# Patient Record
Sex: Female | Born: 1935 | Race: White | Hispanic: No | Smoking: Never smoker
Health system: Southern US, Community
[De-identification: ages and names within clinical notes are randomized; demographics above are authoritative.]

## PROBLEM LIST (undated history)

## (undated) DIAGNOSIS — I10 Essential (primary) hypertension: Secondary | ICD-10-CM

## (undated) DIAGNOSIS — F419 Anxiety disorder, unspecified: Secondary | ICD-10-CM

## (undated) DIAGNOSIS — G629 Polyneuropathy, unspecified: Secondary | ICD-10-CM

## (undated) DIAGNOSIS — F329 Major depressive disorder, single episode, unspecified: Secondary | ICD-10-CM

## (undated) DIAGNOSIS — C569 Malignant neoplasm of unspecified ovary: Secondary | ICD-10-CM

## (undated) DIAGNOSIS — R269 Unspecified abnormalities of gait and mobility: Secondary | ICD-10-CM

## (undated) DIAGNOSIS — G5600 Carpal tunnel syndrome, unspecified upper limb: Secondary | ICD-10-CM

## (undated) DIAGNOSIS — S82899A Other fracture of unspecified lower leg, initial encounter for closed fracture: Secondary | ICD-10-CM

## (undated) DIAGNOSIS — F0390 Unspecified dementia without behavioral disturbance: Secondary | ICD-10-CM

## (undated) DIAGNOSIS — R413 Other amnesia: Principal | ICD-10-CM

## (undated) DIAGNOSIS — H9190 Unspecified hearing loss, unspecified ear: Secondary | ICD-10-CM

## (undated) DIAGNOSIS — M19079 Primary osteoarthritis, unspecified ankle and foot: Secondary | ICD-10-CM

## (undated) DIAGNOSIS — F32A Depression, unspecified: Secondary | ICD-10-CM

## (undated) DIAGNOSIS — H3530 Unspecified macular degeneration: Secondary | ICD-10-CM

## (undated) DIAGNOSIS — K219 Gastro-esophageal reflux disease without esophagitis: Secondary | ICD-10-CM

## (undated) HISTORY — DX: Polyneuropathy, unspecified: G62.9

## (undated) HISTORY — DX: Other fracture of unspecified lower leg, initial encounter for closed fracture: S82.899A

## (undated) HISTORY — DX: Unspecified hearing loss, unspecified ear: H91.90

## (undated) HISTORY — DX: Unspecified macular degeneration: H35.30

## (undated) HISTORY — DX: Depression, unspecified: F32.A

## (undated) HISTORY — DX: Essential (primary) hypertension: I10

## (undated) HISTORY — DX: Other amnesia: R41.3

## (undated) HISTORY — DX: Major depressive disorder, single episode, unspecified: F32.9

## (undated) HISTORY — DX: Unspecified abnormalities of gait and mobility: R26.9

## (undated) HISTORY — DX: Carpal tunnel syndrome, unspecified upper limb: G56.00

## (undated) HISTORY — DX: Anxiety disorder, unspecified: F41.9

## (undated) HISTORY — DX: Malignant neoplasm of unspecified ovary: C56.9

## (undated) HISTORY — DX: Primary osteoarthritis, unspecified ankle and foot: M19.079

## (undated) HISTORY — DX: Gastro-esophageal reflux disease without esophagitis: K21.9

---

## 2000-07-15 ENCOUNTER — Encounter: Payer: Self-pay | Admitting: Gastroenterology

## 2000-07-15 ENCOUNTER — Ambulatory Visit (HOSPITAL_COMMUNITY): Admission: RE | Admit: 2000-07-15 | Discharge: 2000-07-15 | Payer: Self-pay | Admitting: Gastroenterology

## 2002-04-14 ENCOUNTER — Encounter: Payer: Self-pay | Admitting: Internal Medicine

## 2002-04-14 ENCOUNTER — Encounter: Admission: RE | Admit: 2002-04-14 | Discharge: 2002-04-14 | Payer: Self-pay | Admitting: Internal Medicine

## 2003-11-15 ENCOUNTER — Encounter: Admission: RE | Admit: 2003-11-15 | Discharge: 2003-11-15 | Payer: Self-pay | Admitting: Orthopedic Surgery

## 2003-11-16 ENCOUNTER — Ambulatory Visit (HOSPITAL_BASED_OUTPATIENT_CLINIC_OR_DEPARTMENT_OTHER): Admission: RE | Admit: 2003-11-16 | Discharge: 2003-11-16 | Payer: Self-pay | Admitting: Orthopedic Surgery

## 2003-11-16 ENCOUNTER — Ambulatory Visit (HOSPITAL_COMMUNITY): Admission: RE | Admit: 2003-11-16 | Discharge: 2003-11-16 | Payer: Self-pay | Admitting: Orthopedic Surgery

## 2005-02-25 ENCOUNTER — Ambulatory Visit (HOSPITAL_COMMUNITY): Admission: RE | Admit: 2005-02-25 | Discharge: 2005-02-25 | Payer: Self-pay | Admitting: Gastroenterology

## 2005-12-01 HISTORY — PX: OTHER SURGICAL HISTORY: SHX169

## 2006-03-09 ENCOUNTER — Encounter: Admission: RE | Admit: 2006-03-09 | Discharge: 2006-03-09 | Payer: Self-pay | Admitting: Internal Medicine

## 2006-07-08 ENCOUNTER — Encounter: Admission: RE | Admit: 2006-07-08 | Discharge: 2006-07-08 | Payer: Self-pay | Admitting: Gastroenterology

## 2006-07-19 ENCOUNTER — Encounter: Admission: RE | Admit: 2006-07-19 | Discharge: 2006-07-19 | Payer: Self-pay | Admitting: Internal Medicine

## 2006-07-21 ENCOUNTER — Ambulatory Visit: Admission: RE | Admit: 2006-07-21 | Discharge: 2006-07-21 | Payer: Self-pay | Admitting: Gynecology

## 2006-08-05 ENCOUNTER — Ambulatory Visit: Payer: Self-pay | Admitting: Oncology

## 2006-08-25 ENCOUNTER — Ambulatory Visit: Admission: RE | Admit: 2006-08-25 | Discharge: 2006-08-25 | Payer: Self-pay | Admitting: Gynecology

## 2006-09-15 ENCOUNTER — Ambulatory Visit: Payer: Self-pay | Admitting: Oncology

## 2006-09-24 LAB — CBC WITH DIFFERENTIAL/PLATELET
BASO%: 0.1 % (ref 0.0–2.0)
EOS%: 0.5 % (ref 0.0–7.0)
HCT: 35.3 % (ref 34.8–46.6)
MCH: 30.3 pg (ref 26.0–34.0)
MCHC: 34.2 g/dL (ref 32.0–36.0)
MONO#: 0.6 10*3/uL (ref 0.1–0.9)
NEUT%: 79.9 % — ABNORMAL HIGH (ref 39.6–76.8)
RBC: 3.97 10*6/uL (ref 3.70–5.32)
WBC: 9 10*3/uL (ref 3.9–10.0)
lymph#: 1.2 10*3/uL (ref 0.9–3.3)

## 2006-09-24 LAB — COMPREHENSIVE METABOLIC PANEL
ALT: 17 U/L (ref 0–40)
AST: 12 U/L (ref 0–37)
Calcium: 8.7 mg/dL (ref 8.4–10.5)
Chloride: 99 mEq/L (ref 96–112)
Creatinine, Ser: 0.62 mg/dL (ref 0.40–1.20)
Sodium: 136 mEq/L (ref 135–145)
Total Bilirubin: 0.3 mg/dL (ref 0.3–1.2)
Total Protein: 5.9 g/dL — ABNORMAL LOW (ref 6.0–8.3)

## 2006-09-29 LAB — CBC WITH DIFFERENTIAL/PLATELET
BASO%: 0.2 % (ref 0.0–2.0)
Basophils Absolute: 0 10*3/uL (ref 0.0–0.1)
EOS%: 0 % (ref 0.0–7.0)
HGB: 12.5 g/dL (ref 11.6–15.9)
MCH: 29.5 pg (ref 26.0–34.0)
NEUT#: 6.6 10*3/uL — ABNORMAL HIGH (ref 1.5–6.5)
RDW: 13.1 % (ref 11.3–14.5)
WBC: 7 10*3/uL (ref 3.9–10.0)
lymph#: 0.4 10*3/uL — ABNORMAL LOW (ref 0.9–3.3)

## 2006-10-13 LAB — CA 125: CA 125: 6.4 U/mL (ref 0.0–30.2)

## 2006-10-13 LAB — COMPREHENSIVE METABOLIC PANEL
ALT: 19 U/L (ref 0–35)
AST: 12 U/L (ref 0–37)
CO2: 27 mEq/L (ref 19–32)
Creatinine, Ser: 0.59 mg/dL (ref 0.40–1.20)
Sodium: 132 mEq/L — ABNORMAL LOW (ref 135–145)
Total Bilirubin: 0.3 mg/dL (ref 0.3–1.2)
Total Protein: 6 g/dL (ref 6.0–8.3)

## 2006-10-13 LAB — CBC WITH DIFFERENTIAL/PLATELET
BASO%: 0.1 % (ref 0.0–2.0)
LYMPH%: 16.2 % (ref 14.0–48.0)
MCHC: 34.2 g/dL (ref 32.0–36.0)
MCV: 90.6 fL (ref 81.0–101.0)
MONO%: 6.5 % (ref 0.0–13.0)
Platelets: 288 10*3/uL (ref 145–400)
RBC: 3.63 10*6/uL — ABNORMAL LOW (ref 3.70–5.32)

## 2006-10-20 ENCOUNTER — Ambulatory Visit: Admission: RE | Admit: 2006-10-20 | Discharge: 2006-10-20 | Payer: Self-pay | Admitting: Gynecology

## 2006-10-20 LAB — CBC WITH DIFFERENTIAL/PLATELET
BASO%: 0.3 % (ref 0.0–2.0)
EOS%: 0 % (ref 0.0–7.0)
HCT: 34.3 % — ABNORMAL LOW (ref 34.8–46.6)
LYMPH%: 9.7 % — ABNORMAL LOW (ref 14.0–48.0)
MCH: 30.6 pg (ref 26.0–34.0)
MCHC: 34.2 g/dL (ref 32.0–36.0)
MCV: 89.5 fL (ref 81.0–101.0)
MONO#: 0 10*3/uL — ABNORMAL LOW (ref 0.1–0.9)
MONO%: 0.8 % (ref 0.0–13.0)
NEUT%: 89.3 % — ABNORMAL HIGH (ref 39.6–76.8)
Platelets: 398 10*3/uL (ref 145–400)
RBC: 3.83 10*6/uL (ref 3.70–5.32)
WBC: 3.7 10*3/uL — ABNORMAL LOW (ref 3.9–10.0)

## 2006-11-05 ENCOUNTER — Ambulatory Visit: Payer: Self-pay | Admitting: Oncology

## 2006-11-09 LAB — CBC WITH DIFFERENTIAL/PLATELET
EOS%: 0.2 % (ref 0.0–7.0)
Eosinophils Absolute: 0 10*3/uL (ref 0.0–0.5)
HCT: 32.3 % — ABNORMAL LOW (ref 34.8–46.6)
HGB: 11.2 g/dL — ABNORMAL LOW (ref 11.6–15.9)
MCH: 32.1 pg (ref 26.0–34.0)
MCHC: 34.7 g/dL (ref 32.0–36.0)
MONO%: 17.6 % — ABNORMAL HIGH (ref 0.0–13.0)
Platelets: 491 10*3/uL — ABNORMAL HIGH (ref 145–400)
RDW: 17.8 % — ABNORMAL HIGH (ref 11.3–14.5)
WBC: 4.3 10*3/uL (ref 3.9–10.0)

## 2006-11-09 LAB — COMPREHENSIVE METABOLIC PANEL
AST: 11 U/L (ref 0–37)
Albumin: 4 g/dL (ref 3.5–5.2)
Alkaline Phosphatase: 56 U/L (ref 39–117)
Glucose, Bld: 101 mg/dL — ABNORMAL HIGH (ref 70–99)
Potassium: 4 mEq/L (ref 3.5–5.3)
Sodium: 135 mEq/L (ref 135–145)
Total Protein: 6 g/dL (ref 6.0–8.3)

## 2006-12-02 LAB — COMPREHENSIVE METABOLIC PANEL
ALT: 12 U/L (ref 0–35)
AST: 13 U/L (ref 0–37)
Alkaline Phosphatase: 64 U/L (ref 39–117)
Creatinine, Ser: 0.5 mg/dL (ref 0.40–1.20)
Sodium: 135 mEq/L (ref 135–145)
Total Bilirubin: 0.6 mg/dL (ref 0.3–1.2)
Total Protein: 6.3 g/dL (ref 6.0–8.3)

## 2006-12-02 LAB — CBC WITH DIFFERENTIAL/PLATELET
BASO%: 0.3 % (ref 0.0–2.0)
EOS%: 0.3 % (ref 0.0–7.0)
HGB: 11.5 g/dL — ABNORMAL LOW (ref 11.6–15.9)
LYMPH%: 16.8 % (ref 14.0–48.0)
MCV: 95.7 fL (ref 81.0–101.0)
MONO#: 0.5 10*3/uL (ref 0.1–0.9)
MONO%: 12.7 % (ref 0.0–13.0)
NEUT#: 2.9 10*3/uL (ref 1.5–6.5)
NEUT%: 69.9 % (ref 39.6–76.8)
RDW: 17 % — ABNORMAL HIGH (ref 11.3–14.5)

## 2006-12-02 LAB — CA 125: CA 125: 7.9 U/mL (ref 0.0–30.2)

## 2006-12-05 ENCOUNTER — Emergency Department (HOSPITAL_COMMUNITY): Admission: EM | Admit: 2006-12-05 | Discharge: 2006-12-05 | Payer: Self-pay | Admitting: Emergency Medicine

## 2006-12-08 ENCOUNTER — Ambulatory Visit: Admission: EM | Admit: 2006-12-08 | Discharge: 2006-12-08 | Payer: Self-pay | Admitting: Oncology

## 2006-12-08 ENCOUNTER — Encounter: Payer: Self-pay | Admitting: Vascular Surgery

## 2006-12-18 ENCOUNTER — Ambulatory Visit: Payer: Self-pay | Admitting: Oncology

## 2006-12-23 LAB — COMPREHENSIVE METABOLIC PANEL
AST: 8 U/L (ref 0–37)
BUN: 13 mg/dL (ref 6–23)
Calcium: 9 mg/dL (ref 8.4–10.5)
Chloride: 98 mEq/L (ref 96–112)
Creatinine, Ser: 0.62 mg/dL (ref 0.40–1.20)
Total Bilirubin: 0.5 mg/dL (ref 0.3–1.2)

## 2006-12-23 LAB — CBC WITH DIFFERENTIAL/PLATELET
Basophils Absolute: 0 10*3/uL (ref 0.0–0.1)
EOS%: 0.5 % (ref 0.0–7.0)
HCT: 34.3 % — ABNORMAL LOW (ref 34.8–46.6)
HGB: 11.8 g/dL (ref 11.6–15.9)
LYMPH%: 24.3 % (ref 14.0–48.0)
MCH: 33.3 pg (ref 26.0–34.0)
MCHC: 34.4 g/dL (ref 32.0–36.0)
MCV: 96.7 fL (ref 81.0–101.0)
NEUT%: 62.7 % (ref 39.6–76.8)
Platelets: 480 10*3/uL — ABNORMAL HIGH (ref 145–400)
lymph#: 1.1 10*3/uL (ref 0.9–3.3)

## 2006-12-23 LAB — CA 125: CA 125: 8.4 U/mL (ref 0.0–30.2)

## 2007-01-05 ENCOUNTER — Ambulatory Visit: Payer: Self-pay | Admitting: Psychiatry

## 2007-01-15 LAB — CBC WITH DIFFERENTIAL/PLATELET
Basophils Absolute: 0.1 10*3/uL (ref 0.0–0.1)
Eosinophils Absolute: 0 10*3/uL (ref 0.0–0.5)
HGB: 12.2 g/dL (ref 11.6–15.9)
MCV: 94.7 fL (ref 81.0–101.0)
MONO#: 0.7 10*3/uL (ref 0.1–0.9)
NEUT#: 1.7 10*3/uL (ref 1.5–6.5)
RBC: 3.65 10*6/uL — ABNORMAL LOW (ref 3.70–5.32)
RDW: 11.6 % (ref 11.3–14.5)
WBC: 4.1 10*3/uL (ref 3.9–10.0)
lymph#: 1.6 10*3/uL (ref 0.9–3.3)

## 2007-01-15 LAB — COMPREHENSIVE METABOLIC PANEL
ALT: 13 U/L (ref 0–35)
Albumin: 4.3 g/dL (ref 3.5–5.2)
CO2: 28 mEq/L (ref 19–32)
Chloride: 101 mEq/L (ref 96–112)
Glucose, Bld: 131 mg/dL — ABNORMAL HIGH (ref 70–99)
Potassium: 4.3 mEq/L (ref 3.5–5.3)
Sodium: 139 mEq/L (ref 135–145)
Total Protein: 6.2 g/dL (ref 6.0–8.3)

## 2007-01-15 LAB — CA 125: CA 125: 6.7 U/mL (ref 0.0–30.2)

## 2007-01-18 ENCOUNTER — Encounter: Admission: RE | Admit: 2007-01-18 | Discharge: 2007-03-17 | Payer: Self-pay | Admitting: Oncology

## 2007-01-28 ENCOUNTER — Ambulatory Visit (HOSPITAL_COMMUNITY): Admission: RE | Admit: 2007-01-28 | Discharge: 2007-01-28 | Payer: Self-pay | Admitting: Gynecology

## 2007-01-29 ENCOUNTER — Ambulatory Visit: Admission: RE | Admit: 2007-01-29 | Discharge: 2007-01-29 | Payer: Self-pay | Admitting: Gynecology

## 2007-02-02 ENCOUNTER — Ambulatory Visit: Payer: Self-pay | Admitting: Oncology

## 2007-04-16 ENCOUNTER — Ambulatory Visit: Payer: Self-pay | Admitting: Oncology

## 2007-04-20 LAB — COMPREHENSIVE METABOLIC PANEL
ALT: 14 U/L (ref 0–35)
AST: 16 U/L (ref 0–37)
Albumin: 4.2 g/dL (ref 3.5–5.2)
CO2: 26 mEq/L (ref 19–32)
Calcium: 9 mg/dL (ref 8.4–10.5)
Chloride: 97 mEq/L (ref 96–112)
Potassium: 4.4 mEq/L (ref 3.5–5.3)
Sodium: 132 mEq/L — ABNORMAL LOW (ref 135–145)
Total Protein: 6.4 g/dL (ref 6.0–8.3)

## 2007-04-20 LAB — CBC WITH DIFFERENTIAL/PLATELET
BASO%: 0.3 % (ref 0.0–2.0)
Eosinophils Absolute: 0.1 10*3/uL (ref 0.0–0.5)
LYMPH%: 21.3 % (ref 14.0–48.0)
MCHC: 35.2 g/dL (ref 32.0–36.0)
MONO#: 0.7 10*3/uL (ref 0.1–0.9)
MONO%: 14.5 % — ABNORMAL HIGH (ref 0.0–13.0)
NEUT#: 3.1 10*3/uL (ref 1.5–6.5)
Platelets: 321 10*3/uL (ref 145–400)
RBC: 3.98 10*6/uL (ref 3.70–5.32)
RDW: 13.3 % (ref 11.3–14.5)
WBC: 5 10*3/uL (ref 3.9–10.0)

## 2007-04-20 LAB — CA 125: CA 125: 6.2 U/mL (ref 0.0–30.2)

## 2007-07-08 ENCOUNTER — Ambulatory Visit: Payer: Self-pay | Admitting: Oncology

## 2007-07-19 LAB — CBC WITH DIFFERENTIAL/PLATELET
BASO%: 0.3 % (ref 0.0–2.0)
EOS%: 2.6 % (ref 0.0–7.0)
HCT: 39.1 % (ref 34.8–46.6)
MCH: 31.5 pg (ref 26.0–34.0)
MCHC: 34.9 g/dL (ref 32.0–36.0)
MONO#: 0.6 10*3/uL (ref 0.1–0.9)
NEUT%: 62.8 % (ref 39.6–76.8)
RBC: 4.34 10*6/uL (ref 3.70–5.32)
WBC: 4.7 10*3/uL (ref 3.9–10.0)
lymph#: 1 10*3/uL (ref 0.9–3.3)

## 2007-07-20 LAB — COMPREHENSIVE METABOLIC PANEL
ALT: 19 U/L (ref 0–35)
AST: 16 U/L (ref 0–37)
CO2: 28 mEq/L (ref 19–32)
Calcium: 9 mg/dL (ref 8.4–10.5)
Chloride: 96 mEq/L (ref 96–112)
Creatinine, Ser: 0.6 mg/dL (ref 0.40–1.20)
Sodium: 133 mEq/L — ABNORMAL LOW (ref 135–145)
Total Bilirubin: 0.6 mg/dL (ref 0.3–1.2)
Total Protein: 6.6 g/dL (ref 6.0–8.3)

## 2007-07-20 LAB — CA 125: CA 125: 8.9 U/mL (ref 0.0–30.2)

## 2007-07-21 ENCOUNTER — Ambulatory Visit: Admission: RE | Admit: 2007-07-21 | Discharge: 2007-07-21 | Payer: Self-pay | Admitting: Gynecology

## 2007-10-12 ENCOUNTER — Encounter: Admission: RE | Admit: 2007-10-12 | Discharge: 2007-10-12 | Payer: Self-pay | Admitting: Gastroenterology

## 2007-10-17 ENCOUNTER — Ambulatory Visit: Payer: Self-pay | Admitting: Oncology

## 2007-10-20 LAB — CBC WITH DIFFERENTIAL/PLATELET
BASO%: 0.3 % (ref 0.0–2.0)
Basophils Absolute: 0 10*3/uL (ref 0.0–0.1)
EOS%: 4.9 % (ref 0.0–7.0)
HCT: 37.7 % (ref 34.8–46.6)
HGB: 13.3 g/dL (ref 11.6–15.9)
MCH: 32.1 pg (ref 26.0–34.0)
MCHC: 35.3 g/dL (ref 32.0–36.0)
MCV: 90.9 fL (ref 81.0–101.0)
MONO%: 10.7 % (ref 0.0–13.0)
NEUT%: 56.1 % (ref 39.6–76.8)

## 2007-10-20 LAB — COMPREHENSIVE METABOLIC PANEL
ALT: 10 U/L (ref 0–35)
AST: 13 U/L (ref 0–37)
Albumin: 4 g/dL (ref 3.5–5.2)
Alkaline Phosphatase: 59 U/L (ref 39–117)
Glucose, Bld: 102 mg/dL — ABNORMAL HIGH (ref 70–99)
Potassium: 4.2 mEq/L (ref 3.5–5.3)
Sodium: 135 mEq/L (ref 135–145)
Total Protein: 6.3 g/dL (ref 6.0–8.3)

## 2007-12-02 HISTORY — PX: HIP PINNING: SHX1757

## 2007-12-30 ENCOUNTER — Ambulatory Visit: Payer: Self-pay | Admitting: Oncology

## 2008-01-19 ENCOUNTER — Ambulatory Visit: Admission: RE | Admit: 2008-01-19 | Discharge: 2008-01-19 | Payer: Self-pay | Admitting: Gynecology

## 2008-04-06 ENCOUNTER — Ambulatory Visit: Payer: Self-pay | Admitting: Oncology

## 2008-04-06 LAB — COMPREHENSIVE METABOLIC PANEL
ALT: 15 U/L (ref 0–35)
AST: 13 U/L (ref 0–37)
Albumin: 4.1 g/dL (ref 3.5–5.2)
Alkaline Phosphatase: 59 U/L (ref 39–117)
BUN: 13 mg/dL (ref 6–23)
CO2: 26 mEq/L (ref 19–32)
Calcium: 9.2 mg/dL (ref 8.4–10.5)
Creatinine, Ser: 0.65 mg/dL (ref 0.40–1.20)
Glucose, Bld: 83 mg/dL (ref 70–99)
Total Bilirubin: 0.5 mg/dL (ref 0.3–1.2)

## 2008-04-06 LAB — CBC WITH DIFFERENTIAL/PLATELET
Eosinophils Absolute: 0.2 10*3/uL (ref 0.0–0.5)
HCT: 37.8 % (ref 34.8–46.6)
MCH: 31.4 pg (ref 26.0–34.0)
MCV: 89.9 fL (ref 81.0–101.0)
NEUT#: 2.8 10*3/uL (ref 1.5–6.5)
Platelets: 308 10*3/uL (ref 145–400)
RBC: 4.21 10*6/uL (ref 3.70–5.32)
WBC: 4.7 10*3/uL (ref 3.9–10.0)
lymph#: 1.1 10*3/uL (ref 0.9–3.3)

## 2008-04-06 LAB — CA 125: CA 125: 4.9 U/mL (ref 0.0–30.2)

## 2008-06-24 ENCOUNTER — Ambulatory Visit: Payer: Self-pay | Admitting: Surgery

## 2008-06-24 ENCOUNTER — Encounter (INDEPENDENT_AMBULATORY_CARE_PROVIDER_SITE_OTHER): Payer: Self-pay | Admitting: Emergency Medicine

## 2008-06-24 ENCOUNTER — Emergency Department (HOSPITAL_COMMUNITY): Admission: EM | Admit: 2008-06-24 | Discharge: 2008-06-24 | Payer: Self-pay | Admitting: Emergency Medicine

## 2008-06-25 ENCOUNTER — Ambulatory Visit: Payer: Self-pay | Admitting: Oncology

## 2008-06-27 ENCOUNTER — Encounter: Payer: Self-pay | Admitting: Emergency Medicine

## 2008-06-28 ENCOUNTER — Inpatient Hospital Stay (HOSPITAL_COMMUNITY): Admission: EM | Admit: 2008-06-28 | Discharge: 2008-07-04 | Payer: Self-pay | Admitting: Orthopedic Surgery

## 2008-06-29 ENCOUNTER — Encounter (INDEPENDENT_AMBULATORY_CARE_PROVIDER_SITE_OTHER): Payer: Self-pay | Admitting: Orthopedic Surgery

## 2008-08-16 ENCOUNTER — Ambulatory Visit: Admission: RE | Admit: 2008-08-16 | Discharge: 2008-08-16 | Payer: Self-pay | Admitting: Gynecology

## 2008-10-10 ENCOUNTER — Ambulatory Visit: Payer: Self-pay | Admitting: Oncology

## 2008-10-12 LAB — CBC WITH DIFFERENTIAL/PLATELET
BASO%: 0.4 % (ref 0.0–2.0)
Basophils Absolute: 0 10*3/uL (ref 0.0–0.1)
EOS%: 2 % (ref 0.0–7.0)
Eosinophils Absolute: 0.1 10*3/uL (ref 0.0–0.5)
HCT: 39.3 % (ref 34.8–46.6)
HGB: 13.2 g/dL (ref 11.6–15.9)
LYMPH%: 24.7 % (ref 14.0–48.0)
MCH: 30.2 pg (ref 26.0–34.0)
MONO%: 14.1 % — ABNORMAL HIGH (ref 0.0–13.0)
NEUT%: 58.8 % (ref 39.6–76.8)
Platelets: 319 10*3/uL (ref 145–400)
RBC: 4.38 10*6/uL (ref 3.70–5.32)
RDW: 14.5 % (ref 11.3–14.5)

## 2008-10-13 LAB — COMPREHENSIVE METABOLIC PANEL
Alkaline Phosphatase: 70 U/L (ref 39–117)
BUN: 19 mg/dL (ref 6–23)
CO2: 26 mEq/L (ref 19–32)
Calcium: 9.1 mg/dL (ref 8.4–10.5)
Glucose, Bld: 96 mg/dL (ref 70–99)
Total Bilirubin: 0.6 mg/dL (ref 0.3–1.2)

## 2009-03-23 ENCOUNTER — Ambulatory Visit: Payer: Self-pay | Admitting: Oncology

## 2009-03-27 LAB — COMPREHENSIVE METABOLIC PANEL
AST: 14 U/L (ref 0–37)
Albumin: 4.1 g/dL (ref 3.5–5.2)
Creatinine, Ser: 0.92 mg/dL (ref 0.40–1.20)
Glucose, Bld: 95 mg/dL (ref 70–99)
Potassium: 4.5 mEq/L (ref 3.5–5.3)
Sodium: 135 mEq/L (ref 135–145)

## 2009-03-27 LAB — CBC WITH DIFFERENTIAL/PLATELET
Eosinophils Absolute: 0.4 10*3/uL (ref 0.0–0.5)
HCT: 39.9 % (ref 34.8–46.6)
MCH: 31.4 pg (ref 25.1–34.0)
NEUT#: 2.9 10*3/uL (ref 1.5–6.5)
RBC: 4.35 10*6/uL (ref 3.70–5.45)
WBC: 5.8 10*3/uL (ref 3.9–10.3)
lymph#: 1.5 10*3/uL (ref 0.9–3.3)

## 2009-03-27 LAB — CA 125: CA 125: 4.9 U/mL (ref 0.0–30.2)

## 2009-08-29 ENCOUNTER — Ambulatory Visit: Payer: Self-pay | Admitting: Oncology

## 2009-09-05 ENCOUNTER — Ambulatory Visit: Admission: RE | Admit: 2009-09-05 | Discharge: 2009-09-05 | Payer: Self-pay | Admitting: Gynecology

## 2010-02-28 ENCOUNTER — Ambulatory Visit: Payer: Self-pay | Admitting: Oncology

## 2010-03-04 LAB — COMPREHENSIVE METABOLIC PANEL
Alkaline Phosphatase: 54 U/L (ref 39–117)
Calcium: 9 mg/dL (ref 8.4–10.5)
Glucose, Bld: 92 mg/dL (ref 70–99)
Potassium: 4.8 mEq/L (ref 3.5–5.3)
Sodium: 136 mEq/L (ref 135–145)
Total Bilirubin: 0.5 mg/dL (ref 0.3–1.2)
Total Protein: 6.3 g/dL (ref 6.0–8.3)

## 2010-03-04 LAB — CBC WITH DIFFERENTIAL/PLATELET
BASO%: 0.5 % (ref 0.0–2.0)
Basophils Absolute: 0 10*3/uL (ref 0.0–0.1)
EOS%: 3.9 % (ref 0.0–7.0)
Eosinophils Absolute: 0.2 10*3/uL (ref 0.0–0.5)
HCT: 38.1 % (ref 34.8–46.6)
HGB: 13.1 g/dL (ref 11.6–15.9)
MCH: 32.1 pg (ref 25.1–34.0)
MCHC: 34.3 g/dL (ref 31.5–36.0)
MCV: 93.7 fL (ref 79.5–101.0)
NEUT%: 55.3 % (ref 38.4–76.8)
Platelets: 320 10*3/uL (ref 145–400)
RBC: 4.07 10*6/uL (ref 3.70–5.45)
WBC: 5.2 10*3/uL (ref 3.9–10.3)
lymph#: 1.3 10*3/uL (ref 0.9–3.3)

## 2010-08-29 ENCOUNTER — Ambulatory Visit: Payer: Self-pay | Admitting: Oncology

## 2010-09-06 ENCOUNTER — Ambulatory Visit: Admission: RE | Admit: 2010-09-06 | Discharge: 2010-09-06 | Payer: Self-pay | Admitting: Gynecology

## 2010-12-22 ENCOUNTER — Encounter: Payer: Self-pay | Admitting: Oncology

## 2011-01-22 ENCOUNTER — Other Ambulatory Visit: Payer: Self-pay | Admitting: Neurology

## 2011-01-22 DIAGNOSIS — R5383 Other fatigue: Secondary | ICD-10-CM

## 2011-01-22 DIAGNOSIS — R413 Other amnesia: Secondary | ICD-10-CM

## 2011-01-22 DIAGNOSIS — G620 Drug-induced polyneuropathy: Secondary | ICD-10-CM

## 2011-01-22 DIAGNOSIS — I10 Essential (primary) hypertension: Secondary | ICD-10-CM

## 2011-01-22 DIAGNOSIS — C569 Malignant neoplasm of unspecified ovary: Secondary | ICD-10-CM

## 2011-01-22 DIAGNOSIS — G479 Sleep disorder, unspecified: Secondary | ICD-10-CM

## 2011-01-28 ENCOUNTER — Ambulatory Visit
Admission: RE | Admit: 2011-01-28 | Discharge: 2011-01-28 | Disposition: A | Discharge status: Home / Self Care | Payer: MEDICARE | Source: Ambulatory Visit | Attending: Neurology | Admitting: Neurology

## 2011-01-28 DIAGNOSIS — R5381 Other malaise: Secondary | ICD-10-CM

## 2011-01-28 DIAGNOSIS — C569 Malignant neoplasm of unspecified ovary: Secondary | ICD-10-CM

## 2011-01-28 DIAGNOSIS — G620 Drug-induced polyneuropathy: Secondary | ICD-10-CM

## 2011-01-28 DIAGNOSIS — I10 Essential (primary) hypertension: Secondary | ICD-10-CM

## 2011-01-28 DIAGNOSIS — R413 Other amnesia: Secondary | ICD-10-CM

## 2011-01-28 DIAGNOSIS — G479 Sleep disorder, unspecified: Secondary | ICD-10-CM

## 2011-03-10 ENCOUNTER — Encounter (HOSPITAL_BASED_OUTPATIENT_CLINIC_OR_DEPARTMENT_OTHER): Payer: MEDICARE | Admitting: Oncology

## 2011-03-10 ENCOUNTER — Other Ambulatory Visit: Payer: Self-pay | Admitting: Oncology

## 2011-03-10 DIAGNOSIS — C569 Malignant neoplasm of unspecified ovary: Secondary | ICD-10-CM

## 2011-03-10 LAB — CBC WITH DIFFERENTIAL/PLATELET
BASO%: 0.8 % (ref 0.0–2.0)
Basophils Absolute: 0 10*3/uL (ref 0.0–0.1)
Eosinophils Absolute: 0.2 10*3/uL (ref 0.0–0.5)
LYMPH%: 25.1 % (ref 14.0–49.7)
MCH: 30.2 pg (ref 25.1–34.0)
MCHC: 33.8 g/dL (ref 31.5–36.0)
MONO#: 0.5 10*3/uL (ref 0.1–0.9)
MONO%: 14.7 % — ABNORMAL HIGH (ref 0.0–14.0)
Platelets: 323 10*3/uL (ref 145–400)

## 2011-03-10 LAB — COMPREHENSIVE METABOLIC PANEL
ALT: 19 U/L (ref 0–35)
Alkaline Phosphatase: 55 U/L (ref 39–117)
CO2: 27 mEq/L (ref 19–32)
Calcium: 9 mg/dL (ref 8.4–10.5)
Glucose, Bld: 92 mg/dL (ref 70–99)
Potassium: 4.2 mEq/L (ref 3.5–5.3)

## 2011-03-10 LAB — CA 125: CA 125: 4.5 U/mL (ref 0.0–30.2)

## 2011-03-19 ENCOUNTER — Encounter (HOSPITAL_BASED_OUTPATIENT_CLINIC_OR_DEPARTMENT_OTHER): Payer: MEDICARE | Admitting: Oncology

## 2011-03-19 DIAGNOSIS — C569 Malignant neoplasm of unspecified ovary: Secondary | ICD-10-CM

## 2011-03-19 DIAGNOSIS — G620 Drug-induced polyneuropathy: Secondary | ICD-10-CM

## 2011-03-19 DIAGNOSIS — R5383 Other fatigue: Secondary | ICD-10-CM

## 2011-04-15 NOTE — Consult Note (Signed)
NAMEJERSEE, WINIARSKI                    ACCOUNT NO.:  [id number]   MEDICAL RECORD NO.:  [id number]          PATIENT TYPE:  OUT   LOCATION:  GYN                          FACILITY:  [facility]   PHYSICIAN:  De Blanch, M.D.DATE OF BIRTH:  Jan 07, 1925   DATE OF CONSULTATION:  08/16/2008  DATE OF DISCHARGE:  08/16/2008                                 CONSULTATION   CHIEF COMPLAINT:  Ovarian cancer, peripheral neuropathy.   INTERVAL HISTORY:  Since her last visit the patient has had a number of  new medical problems crop up.  She complains of peripheral neuropathy in  her feet.  She notes that previously and after the chemotherapy she had  neuropathy in her right foot.  She now tells me that about 3 months ago  the neuropathy appeared in her left foot as well.  She apparently was  taking Lyrica for a brief period of time.  During that time she fell and  broke her hip requiring open pinning of the hip.   The patient also has been seen in the emergency room in [place] recently  with GI symptoms.  A complete abdominal ultrasound looked very normal  except for some thickened small bowel loops consistent with an  enteritis.  This has apparently resolved.   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  She required a rectosigmoid resection.  She subsequently  received six cycles of carboplatin and Taxol chemotherapy  postoperatively and has been followed since that time with no evidence  of recurrent disease.  She had a recent CA-125 in August of 5 units/mL.   PAST MEDICAL HISTORY:  Medical illnesses - hypertension.   CURRENT MEDICATIONS:  Diovan and Cymbalta.   DRUG ALLERGIES:  No known drug allergies.   PAST SURGICAL HISTORY:  Ovarian cancer bulking August 2007, hip pinning  2009.   FAMILY HISTORY:  Is  negative for gynecologic, breast or colon cancer.   OBSTETRICAL HISTORY:  Gravida 2.  She comes accompanied by one of her  daughters.   SOCIAL HISTORY:  The patient is married.  She is an avid golfer although  has not been able to golf recently because of her neuropathy and her hip  fracture.   REVIEW OF SYSTEMS:  Ten point comprehensive review of systems negative  except as noted above.   PHYSICAL EXAM:  VITAL SIGNS:  Weight 141 pounds, blood pressure 138/72.  GENERAL:  The patient is a healthy but elderly white female who has a  number of concerns and questions but is in no acute distress.  HEENT:  Negative.  NECK:  Supple without thyromegaly.  There is no supraclavicular or  inguinal adenopathy.  ABDOMEN:  Soft, nontender.  No masses, organomegaly, ascites or hernias  noted.  PELVIC:  EG/BUS, vagina, bladder and urethra are normal but atrophic.  Cervix and uterus are surgically absent.  Adnexa without masses.  Rectovaginal exam  confirms.   IMPRESSION:  1. Stage IIB ovarian cancer August 2007 and no evidence of disease      with normal CA-125.  2. Peripheral neuropathy.  Part of this may be associated with her use      of Taxol.  However, I cannot explain the migration of neuropathy to      her left foot some months after completing chemotherapy.   With regard to the neuropathy I have encouraged her to take vitamin B6  100 mg a day for the next 2 months.  If she is not better then I would  suggest she see a neurologist for further evaluation.  It is noted that  she has previously received Lyrica and had difficulties with balance and  staggering gait.  She will return to see Dr. Darrold Span in November of 2009  and return to see me in October of 2010.  We will continue to monitor CA-  125 values at each visit.      De Blanch, M.D.  Electronically Signed     DC/MEDQ  D:  08/16/2008  T:  08/18/2008  Job:  [id number]   cc:   Theressa Millard, M.D.  Fax: [phone]    Telford Nab, R.N.  [address]  [place], [place] [address]   Lennis P. Darrold Span, M.D.  Fax: [phone]   Currie Paris, M.D.  [address], [address]  [place]  [place] [address]

## 2011-04-15 NOTE — Discharge Summary (Signed)
Peggy Conner, Peggy Conner                    ACCOUNT NO.:  [id number]   MEDICAL RECORD NO.:  [id number]          PATIENT TYPE:  INP   LOCATION:  1531                         FACILITY:  [facility]   PHYSICIAN:  Ollen Gross, M.D.    DATE OF BIRTH:  09-05-1925   DATE OF ADMISSION:  06/28/2008  DATE OF DISCHARGE:                               DISCHARGE SUMMARY   <POSSIBLE DATE OF DISCHARGE/>  July 03, 2008   ADMITTING DIAGNOSES:  1. Right femoral neck fracture.  2. Hypertension.  3. Mild hyponatremia.  4. Peripheral neuropathy.  5. Ovarian adenocarcinoma.   DISCHARGE DIAGNOSES:  1. Right femoral neck fracture status post right hip hemiarthroplasty.  2. Hyponatremia exacerbated by surgery improving.  3. Hypertension.  4. Peripheral neuropathy.  5. Ovarian adenocarcinoma.   PROCEDURE:  June 29, 2008, right hip hemiarthroplasty.   SURGEON:  Dr. Lequita Halt.   ASSISTANT:  Avel Peace PA-C.   CONSULTS:  Medical Services, Dr. Earl Gala and staff.   BRIEF HISTORY:  The patient is an 77 year old female who was seen in the  emergency room following an injury on June 28, 2008.  She had fallen the  day before.  She had recently been placed on Medrol Dosepak for some  underlying peripheral neuropathy.  She felt like she had lost her  footing and fell sustaining an injury to her right hip.  She was brought  to the Emergency Department [facility] where the x-rays were  found to show a displaced right femoral neck fracture.  She was seen and  evaluated by Dr. Venita Lick and admitted for bone injury, requested.   LABORATORY DATA:  CBC on admission:  Hemoglobin 13.4, hematocrit of  39.6, white cell count 10.7, platelets 308.  BMET on admission:  Sodium  low at 132, glucose slightly elevated at 118.  Remaining BMET within  normal limits.  UA was negative.  Serial CBCs were followed.  Hemoglobin  did drop down to 11.4.  Last H and H was 10.1 of 29.4.  Serial pro-times  were followed per  Coumadin protocol.  Last PT/INR is 21.6 and 1.8.  Followup BMETs were done.  Sodium did drop down to 130 but came back up  to 132.  Glucose went up to 140 back down to 102.  Remaining  electrolytes remained within normal limits   EKG November 15, 2003, sinus rhythm with frequent premature ventricular  complexes, possible left atrial enlargement, left axis deviation, septal  infarct age undetermined, abnormal EKG, no previous tracing performed by  Dr. Aggie Cosier.  She had no new EKG on June 27, 2008, sinus rhythm,  left anterior fascicular block, late precordial RS transition, left  ventricular hypertrophy.  Chest x-ray June 27, 2008, mild cardiomegaly,  bibasilar atelectasis or scar.   HOSPITAL COURSE:  The patient was admitted to [facility] for  the above-stated issue as per Dr. Venita Lick who was admitted, placed  at bedrest and started preop for tentative surgery.  She had requested  the services of Dr. Ollen Gross, Dr. Shon Baton and Dr. Lequita Halt spoke  and  Dr. Lequita Halt evaluated the patient after admission and took over care.  Medical consult was called.  The patient was seen by Dr. Suanne Marker covering  for Dr. Earl Gala.  Dr. Earl Gala later saw the patient and assisted with  medical care perioperatively.  She was seen and felt to be stable for  surgery.  Dr. Lequita Halt preoped her and was taken to the operating room on  following day of June 29, 2008, who underwent the above-stated procedure  without complication.  The patient tolerated procedure well, later  transferred to room and orthopedic and then to the med surgical floor.  She is placed on PCA and p.o. analgesics for pain control initially.  After discussing with her and her social situation, patient  stated her  husband was not in condition to take care of her postoperatively so she  wanted to look into a skilled rehab facility.  She is doing much better  on day 1.  Hemoglobin was stable at 12.  She had good output.  We   discontinued the PCAs and also been knee immobilizer.  We did discuss  during the surgery with her history of ovarian cancer, we did send the  femoral head for a pathology report, started getting up out of bed  transferring.  By day 2, she was doing much better, pain under control.  Discontinued the Foley.  Dressing changed, incision looked good.  No  signs of infection.  Continued to work on discharge planning.  She was  kept through the weekend.  Social service had seen her before the  weekend, started paperwork and that was sent out.  Over the weekend  covering services did change her over from Percocet to Vicodin due to a  little bit of constipation, put her on some bowel medications.  By  Monday July 03, 2008, she was doing well.  We had not had any bed  offers as of morning rounds, waiting on social services to assist with  placement.  We did discuss several issues.  She had an appointment  coming up to followup with her oncologist Dr. De Blanch on  Wednesday in 2 days.  We are waiting on a bed available for rehab.  There is a possibility would come available today or tomorrow.  If it  did become available, I discussed with the patient of accepting the bed.  We would make arrangements through the rehab facility for her to come  back for her visit.  She was quite concerned about missing this.  We  also discussed talking with the oncology center.  She was supposed to  have blood work drawn on July 03, 2008, for her appointment in  preparation for July 05, 2008.  We will also have that done while she  is here.  The patient is stable, not quite independent enough for home,  looking for a rehab bed and awaiting final decision.   DISCHARGE/PLAN:  1. Possible tentative date of discharge today July 03, 2008.  2. Discharge diagnoses please see above.  3. Discharge medications:  Current medications at time of transfer      include Diovan 320 mg p.o. daily, Cymbalta 60 mg p.o.  daily, Colace      100 mg p.o. b.i.d.  She is on Coumadin protocol.  Please titrate      the Coumadin level for target INR between 2.0-3.0.  She needs to be      on Coumadin for 2 weeks from the date of  surgery of June 29, 2008.      MiraLax 17 g in 8 ounces water p.o. b.i.d.; once moving bowels      change to daily.  Os-Cal 500 p.o. b.i.d., vitamin D 1000 units p.o.      daily, laxative choice, enema choice, Vicodin 5 mg 1 or 2 every 4      hours as needed for pain, Robaxin 500 mg p.o. q.6-8 hours p.r.n.      spasm, Tylenol 325 1 or 2 every 4-6 hours as needed for mild pain      temporal headache,  artificial tears ophthalmic solution both eyes      p.r.n.   DIET:  Low-sodium, heart-healthy diet.   ACTIVITY:  She can be weightbearing as tolerated to the right lower  extremity, hip precautions, PT and OT for gait training ambulation, ADLs  for fracture hip protocol, daily dressing change to the right hip  incision.  She may start showering, however, do not submerge the  incision under water.   FOLLOWUP:  She needs to followup with Dr. Ollen Gross 2 weeks from  date of surgery.  She can followup on Thursday or Friday which will be  August 13th or 14th.  Please contact our office to arrange an  appointment time for this patient on one of those days.   She needs to followup with Dr. De Blanch on Wednesday July 05, 2008.  She has an appointment already arranged.  Please work with the  patient on arranging transfer. Arrange transportation for this patient  to come back and be evaluated by the oncology service.   DISPOSITION:  Is pending at this time.   PATHOLOGY REPORT:  Is pending at this time.  No pathology report  available.   CONDITION UPON DISCHARGE:  Stable.      Peggy Conner, P.A.C.      Ollen Gross, M.D.  Electronically Signed    ALP/MEDQ  D:  07/03/2008  T:  07/03/2008  Job:  [id number]   cc:   Ollen Gross, M.D.  Fax: [phone]    De Blanch, M.D.  [address]  [place]  [place] [address]   OSBORNE, DR.   Alvy Beal, MD  Fax: [phone]

## 2011-04-15 NOTE — Consult Note (Signed)
Peggy Conner, Peggy Conner                    ACCOUNT NO.:  [id number]   MEDICAL RECORD NO.:  [id number]          PATIENT TYPE:  OUT   LOCATION:  GYN                          FACILITY:  [facility]   PHYSICIAN:  De Blanch, M.D.DATE OF BIRTH:  01-22-1925   DATE OF CONSULTATION:  01/19/2008  DATE OF DISCHARGE:                                 CONSULTATION   CHIEF COMPLAINT:  Ovarian cancer.   INTERVAL HISTORY:  The patient returns today for continuing followup.  She saw Dr. Darrold Span 3 months ago and since then has done well. She has  minimal peripheral neuropathy especially in her feet. She has no other  GI or GU symptoms. She does complain of some fatigue and decreased  energy.   HISTORY OF PRESENT ILLNESS:  The patient had a pelvic mass and underwent  surgical exploration and resection of a stage IIB poorly differentiated  adenocarcinoma of the ovary measuring 8.5 cm. She had a direct invasion  of the uterine corpus and transmural invasion of the rectosigmoid colon.  She required a rectosigmoid resection with low rectal anastomosis which  resulted in complete surgical excision of her primary tumor and staging  study showed no evidence of disease beyond the pelvis. She was treated  with 6 cycles of carboplatin and Taxol, the last being administered  December 25, 2006. She had a normal CT scan at the end of that treatment  regimen.   PAST MEDICAL HISTORY:  Medical illnesses, hypertension.   CURRENT MEDICATIONS:  Diovan and Cymbalta.   MEDICATION ALLERGIES:  None.   PAST SURGICAL HISTORY:  Ovarian cancer debulking in 2007.   FAMILY HISTORY:  Negative for gynecologic, breast or colon cancer.   OBSTETRICAL HISTORY:  Gravida 2. She comes accompanied by one of her  daughters today.   SOCIAL HISTORY:  The patient is married. She is an avid Teacher, English as a foreign language. She does  not smoke.   REVIEW OF SYSTEMS:  A 10-point comprehensive review of systems negative  except as noted above.   PHYSICAL  EXAMINATION:  VITAL SIGNS:  Weight 142 pounds, blood pressure  124/78, pulse 80, respiratory rate 20.  GENERAL:  The patient is a healthy white female in no acute distress.  HEENT:  Negative.  NECK:  Supple without thyromegaly. There was no supraclavicular or  inguinal adenopathy.  ABDOMEN:  Soft, nontender, no masses, organomegaly, ascites or hernias  are noted.  PELVIC:  EGBUS, vagina, bladder, urethra are normal. Cervix and uterus  surgically absent.  Adnexa without masses. Rectovaginal exam confirms.  EXTREMITIES:  Lower extremities are without edema or varicosities.   IMPRESSION:  Stage IIB grade 3 ovarian cancer, no evidence of recurrent  disease. It is noted the patient had a CA 125 value on January 03, 2008  which was 5 units per mL. We will plan on the patient seeing Dr. Darrold Span  in 3 months and have her return to see me in 6 months.      De Blanch, M.D.  Electronically Signed     DC/MEDQ  D:  01/19/2008  T:  01/20/2008  Job:  [id number]   cc:   Telford Nab, R.N.  [address]  [place], [place] [address]   Lennis P. Darrold Span, M.D.  Fax: [phone]   Theressa Millard, M.D.  Fax: [phone]   Currie Paris, M.D.  [address], [address]  [place]  [place] [address]

## 2011-04-15 NOTE — Consult Note (Signed)
Peggy Conner, OWENSBY                    ACCOUNT NO.:  [id number]   MEDICAL RECORD NO.:  [id number]          PATIENT TYPE:  OUT   LOCATION:  GYN                          FACILITY:  [facility]   PHYSICIAN:  De Blanch, M.D.DATE OF BIRTH:  1925/10/06   DATE OF CONSULTATION:  07/21/2007  DATE OF DISCHARGE:                                 CONSULTATION   CHIEF COMPLAINT:  Ovarian cancer.   INTERVAL HISTORY:  Since her last visit, the patient seems to have  recovered fully from her chemotherapy.  She is active golfing and with  other civic activities.  She denies any GI or GU symptoms, has no pelvic  pain, pressure, vaginal bleeding or discharge.  She apparently developed  a cough which lasted approximately 6 weeks.  Her primary care physician,  Dr. Earl Gala, has evaluated this and she feels better at this time.   HISTORY OF PRESENT ILLNESS:  The patient underwent surgical resection of  a stage IIb poorly-differentiated adenocarcinoma measuring 8.5 cm.  She  had direct invasion of the uterine corpus and transmural invasion of the  rectosigmoid requiring a rectosigmoid resection with low rectal  anastomosis.  She was then treated with carboplatin and Taxol, receiving  six cycles.  At the end of that time she had a normal CT scan.   PAST MEDICAL HISTORY:  Hypertension.   CURRENT MEDICATIONS:  Diovan and Cymbalta.   DRUG ALLERGIES:  None.   PAST SURGICAL HISTORY:  Ovarian cancer debulking 2007.   FAMILY HISTORY:  Negative for gynecologic, breast or colon cancer.   OBSTETRICAL HISTORY:  Gravida 2.   SOCIAL HISTORY:  The patient is married.  She is an avid Teacher, English as a foreign language.  She  does not smoke.   REVIEW OF SYSTEMS:  A 10-point comprehensive review of systems negative  except as noted above.   PHYSICAL EXAMINATION:  Weight 139 pounds, blood pressure 140/80.  GENERAL:  The patient is a healthy white female in no acute distress.  HEENT:  Negative.  NECK:  Supple without thyromegaly.  There is  no supraclavicular or  inguinal adenopathy.  ABDOMEN:  Soft, nontender.  No mass, organomegaly, ascites or hernias  noted.  PELVIC:  EG/BUS, vagina, bladder, urethra are normal.  Cervix and uterus  are surgically absent.  Adnexa without masses.  Rectovaginal exam  confirms.   IMPRESSION:  Stage IIb adenocarcinoma of the ovary August 2007,  clinically free of disease.   PLAN:  The patient's CA-125 on August 18 is reviewed and was 8.9  units/mL.  The patient will return to see Dr. Darrold Span in 3 months and  return to see [place] in 6 months.      De Blanch, M.D.  Electronically Signed     DC/MEDQ  D:  07/21/2007  T:  07/22/2007  Job:  [id number]

## 2011-04-15 NOTE — Op Note (Signed)
NAMEKARMAN, BISWELL                    ACCOUNT NO.:  [id number]   MEDICAL RECORD NO.:  [id number]          PATIENT TYPE:  INP   LOCATION:  1531                         FACILITY:  [facility]   PHYSICIAN:  Ollen Gross, M.D.    DATE OF BIRTH:  1925-07-26   DATE OF PROCEDURE:  06/29/2008  DATE OF DISCHARGE:                               OPERATIVE REPORT   PREOPERATIVE DIAGNOSIS:  Right femoral neck fracture.   POSTOPERATIVE DIAGNOSIS:  Right femoral neck fracture.   PROCEDURE:  Right hip hemiarthroplasty.   SURGEON:  Dr. Lequita Halt   ASSISTANT:  Avel Peace PA-C.   ANESTHESIA:  General.   ESTIMATED BLOOD LOSS:  200.   DRAIN:  Hemovac x1.   COMPLICATIONS:  None.   CONDITION:  Stable to recovery.   BRIEF CLINICAL NOTE:  Ms. Lambson is an 77 year old female who had a fall  yesterday, sustaining a partially displaced right femoral neck fracture.  She had significant osteopenia on her plain films and history of ovarian  cancer, so we opted to do a hemiarthroplasty as opposed to a reduction  and pinning to make sure this is not a pathologic fracture and also  because of concern that the bone would not be strong enough to support  internal fixation.  She presents now for right hip hemiarthroplasty.   PROCEDURE IN DETAIL:  After the successful administration of general  anesthetic, the patient was placed in the left lateral decubitus  position with the right side up and held with the hip positioner.  The  right lower extremity was isolated from the perineum with plastic drapes  and prepped and draped in the usual sterile fashion.  A short  posterolateral incision was made with a 10 blade through the  subcutaneous tissue to the level of the fascia lata which was incised in  line with the skin incision.  The sciatic nerve was palpated and  protected, and the short rotator was isolated off the femur.  Capsulotomy was performed.  Upon entering the joint, there was fracture  hematoma identified.   The femoral neck fracture was displaced.  It was a  very low femoral neck fracture with comminution.  We removed the femoral  head and it measured 48 mm in diameter.  I then freshened up the femoral  neck cut with an oscillating saw.  The femoral canal was thoroughly  irrigated and then I began broaching for the Summit basic cemented stem.  I broached up to a size 4, which had good rotational fit.  We placed a  trial 28 plus 5 head with a 48 mm bipolar.  I trialed the bipolar and  the 48 had good suction and fit.  With this, the leg was still short due  to the fact that the fracture was very low on the femoral neck.  I went  up to a +12 head with the 48 bipolar trial, and this has had great  stability with appropriate soft tissue tension.  She was stable to full  extension, full external rotation, 70 degrees flexion, 40 degrees  adduction,  90 degrees internal rotation, 90 degrees of flexion and 70  degrees of internal rotation.  By placing the right leg on top of the  left leg, lengths were found to be equal.   The hip was dislocated and the trials removed.  We trialed for the  cement restrictor for the femoral canal and a size 3 was the most  appropriate.  This was placed into the appropriate depth of the femoral  canal.  The canal was then prepared with pulsatile lavage.  Cement was  mixed.  Once ready for implantation, it was injected into the femoral  canal and pressurized.  The size 4 Summit basic cemented stem was then  impacted into the femoral canal in about 20 degrees of anteversion.  It  was held until the cement had hardened.  All extruded cement had been  removed.  Once this hardened, then we placed the 28 +12 head with the 48  mm bipolar component.  The hip was reduced with the same stability  parameters.  The wound was further irrigated with saline solution.  The  capsule and short rotators were reattached to the femur through drill  holes with Ethibond suture.  We had very  stable soft tissue repair.  The  fascia lata was closed over the Hemovac drain with interrupted #1  Vicryl, the subcu closed with #1 and #2-0 Vicryl and subcuticular  running 4-0 Monocryl.  The drain was hooked to suction.  The incision  cleaned and dried and Steri-Strips and a bulky sterile dressing applied.  She was then placed into a knee immobilizer, awakened and transported to  recovery in stable condition.      Ollen Gross, M.D.  Electronically Signed     FA/MEDQ  D:  06/29/2008  T:  06/29/2008  Job:  [id number]

## 2011-04-15 NOTE — H&P (Signed)
Peggy Conner, Peggy Conner                    ACCOUNT NO.:  [id number]   MEDICAL RECORD NO.:  [id number]          PATIENT TYPE:  INP   LOCATION:  1531                         FACILITY:  [facility]   PHYSICIAN:  Alvy Beal, MD    DATE OF BIRTH:  1925/01/26   DATE OF ADMISSION:  06/28/2008  DATE OF DISCHARGE:                              HISTORY & PHYSICAL   ADMISSION DIAGNOSIS:  Right femoral neck fracture.   HISTORY:  She is a very pleasant 77 year old woman who is independently  living in essentially good medical condition who fell yesterday.  She  has an underlying diabetic neuropathy.  She was recently put on a Medrol  Dosepak, and she has been on some varying medications for that. She felt  a little kind of like she lost her footing and fell and noted immediate  pain in the right hip area.  As a result she was seen at our outpatient  emergency department and transferred here to [facility] for definitive  fracture management.   Currently the patient is in the hospital, she is resting comfortably.  Her major complaint is just the peripheral neuropathy that she has  causing the numbness in the right lower extremity.  She was supposed to  see Dr. Lestine Box in the near future for this, however.  As a result of  her fracture she presents now for further definitive management.   PAST MEDICAL HISTORY:  Her medical history includes hypertension,  history of ovarian cancer that is in remission and is being followed by  the oncology service.  She had a history of peripheral neuropathy, and  mild hyponatremia.  She has no shortness of breath or chest pain.  She  has no significant other cardiovascular risk factors.  She denies  tobacco use.  She occasionally drinks red wine.   CLINICAL EXAM:  GENERAL:  She is a pleasant woman appearing her stated  age in no acute distress.  She is alert and oriented x3.  CARDIOVASCULAR:  No shortness of breath or chest pain.  LUNGS:  Lung fields and clear to  auscultation.  NEURO:  Cranial Nerves: II to XII were tested and all intact with no  deficits.  VITAL SIGNS:  She is afebrile.  Stable vital signs.  EXTREMITIES:  She has intact peripheral pulses throughout. Evaluation of  the right hip reveals significant pain and tenderness on direct  palpation.  No knee pain.  Distal neurological exam is intact.  Left  lower extremities have no gross crepitus, deformity, or pain.   X-rays demonstrate a femoral neck fracture on the right side.  At this  point in time the patient is comfortable, and she has personally  requested  Dr. Lequita Halt to treat her pressure definitive fracture.  I  have spoken with Dr. Lequita Halt this morning.  He will be happy to see the  patient and assume her care.  I have discussed this with the patient.  In the meantime I will keep her NPO, and we will get her an EKG per the  medical consult and as  well as TEDs and STDs.      Alvy Beal, MD  Electronically Signed    DDB/MEDQ  D:  06/28/2008  T:  06/28/2008  Job:  [id number]

## 2011-04-15 NOTE — Discharge Summary (Signed)
Peggy Conner, HABERL                    ACCOUNT NO.:  [id number]   MEDICAL RECORD NO.:  [id number]          PATIENT TYPE:  INP   LOCATION:  1531                         FACILITY:  [facility]   PHYSICIAN:  Ollen Gross, M.D.    DATE OF BIRTH:  September 01, 1925   DATE OF ADMISSION:  06/28/2008  DATE OF DISCHARGE:  07/04/2008                               DISCHARGE SUMMARY   ADMITTING AND DISCHARGE DIAGNOSES:  As per previously dictated summary.   PROCEDURE:  Right hip hemiarthroplasty on June 29, 2008.   ADDITIONAL LABORATORY VALUES:  CA 125 cancer antigen level 5.0.   Pathology report right femoral head fracture excision at time of surgery  shows degenerative joint disease.  Clinically right femoral neck  fracture.  No evidence of malignancy.   HOSPITAL COURSE:  There was a possibility that a bed would be available  for this patient on July 03, 2008 when the original discharge summary  was dictated.  There were several bed offers by that afternoon and we  were unable to make arrangements for the patient.  The bed offers were  given to the patient and the family, facilities were reviewed and the  patient has chosen [facility].  Arrangements were being  made.  The patient was seen on the morning of July 04, 2008 on the date  of discharge. She was doing well, no complaints.  Incision was healing  well.  She was in agreement with the plan.  Arrangements being made, she  would be transferred out to that facility at this time.   DISCHARGE/PLAN:  1. Discharge to [facility] on July 04, 2008.  2. Discharge diagnoses:  Please see previously dictated discharge      summary.  3. Discharge medications.  Continued medications as per discharge      summary.  4. Diet:  Low sodium heart-healthy diet.  5. Activity:  Already dictated under the original summary.  6. Follow-up.  There are some changes to the follow-up. She does need      to continue her follow up with Dr.  Lequita Halt 2 weeks from the      surgery.  She may follow-up on Thursday or Friday which will be      August 13 or 14.  Please contact our office to make arrangements      for appointment and time on one of those days.   The follow-up appointment with Dr. Emmaline Kluver has been change.  She originally was going to be seen on Wednesday August 5. The new  appointment is now September 8 at 3:00 p.m.  Please remind the patient  her visit with Dr. Loree Fee will be on August 08, 2008 at 3:00  p.m.  Please have that attached to her discharge papers once she is  released from [facility].   DISPOSITION:  Medical illustrator.   CONDITION ON DISCHARGE:  Improving.      Alexzandrew L. Perkins, P.A.C.      Ollen Gross, M.D.  Electronically Signed    ALP/MEDQ  D:  07/04/2008  T:  07/04/2008  Job:  [id number]   cc:   Ollen Gross, M.D.  Fax: [phone]   De Blanch, M.D.  [address]  [place]  [place] [address]   Theressa Millard, M.D.  Fax: [phone]   Alvy Beal, MD  Fax: [phone]

## 2011-04-18 NOTE — Consult Note (Signed)
NAMEANNMARGARET, DECAPRIO                    ACCOUNT NO.:  [id number]   MEDICAL RECORD NO.:  [id number]          PATIENT TYPE:  OUT   LOCATION:  GYN                          FACILITY:  [facility]   PHYSICIAN:  De Blanch, M.D.DATE OF BIRTH:  [birth year]   DATE OF CONSULTATION:  07/21/2006  DATE OF DISCHARGE:                                   CONSULTATION   REFERRING PHYSICIAN:  Cicero Duck, M.D.   CHIEF COMPLAINT:  Left lower quadrant pain and pressure.   HISTORY OF PRESENT ILLNESS:  A 77 year old white married female seen in  consultation at request Dr. Cicero Duck regarding management of a newly  recognized pelvic mass, colonic pressure, and an elevated CA-125.  Patient  reports that she has had symptoms for possibly two years of pelvic  heaviness, decreased energy, and early fatigue.  More recently she has had  early satiety and claims to have lost 17 pounds over the past two months.  The patient has undergone extensive work-up including a colonoscopy showing  deviation of the colon, although no intrinsic lesions were noted and biopsy  of the mucosa shows simply inflammation.  On CT scan and MRI the patient has  approximately 8 cm pelvic mass displacing the colon to the right of midline.  On MRI three distinct masses are seen in the pelvis.  One is in the  presacral space and appears separate from the colon and separate from the  uterus.  Second lesion is along the right posterior sigmoid wall and the  third lesion with internal cystic components is present in the left  adnexa/left uterine fundus.  The ovaries could not be identified separate  from these masses.   The patient has also had a CA-125 value which is 557 units per mL.  She  denies any rectal bleeding, but does have a considerable amount of mucus  from the rectum, especially on the morning.   PAST MEDICAL HISTORY:   MEDICAL ILLNESSES:  Hypertension.   CURRENT MEDICATIONS:  Diovan and Cymbalta.   DRUG ALLERGIES:   None.   PRIOR SURGERY:  None.   FAMILY HISTORY:  Negative for gynecologic, breast, or colon cancer.  The  patient's mother died at age 22.   OBSTETRICAL HISTORY:  Gravida 2.  The patient's son is the Museum/gallery exhibitions officer at [employer].  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   SOCIAL HISTORY:  The patient is married.  She does not smoke.  She is  retired and, as noted above, is an Herbalist.   REVIEW OF SYSTEMS:  10-point comprehensive review of systems negative except  as noted above.   PHYSICAL EXAMINATION:  VITAL SIGNS:  Weight 130 pounds, height 5 feet 5,  blood pressure 134/84, pulse 84.  GENERAL:  The patient is a healthy white female in no acute distress.  HEENT:  Negative.  NECK:  Supple without thyromegaly.  There is no supraclavicular or inguinal  adenopathy.  ABDOMEN:  Soft.  She  has tenderness in the left lower quadrant.  No discrete  masses are noted.  I do not detect any evidence of ascites or shifting  dullness.  PELVIC:  EGBUS, vagina, bladder, urethra are normal, but atrophic.  Cervix  is atrophic.  In the left adnexa is a mass which is inseparable from the  uterus measuring approximately 8 cm.  This is moderately tender.  Rectovaginal exam confirms.   IMPRESSION:  Complex pelvic mass with elevated CA-125 most likely  representing ovarian cancer.  I had a lengthy discussion with the patient  and her daughter and husband regarding these findings.  I would recommend  that she undergo exploratory laparotomy with resection of the mass,  intraoperative frozen section, and appropriate surgical management  thereafter.  The extent of surgery recommended for women with ovarian cancer  was outlined including staging and ovarian cancer debulking.  She  understands she may have a rectosigmoid resection and we will attempt to  perform a low rectal anastomosis if at all possible.   The risks of surgery  including hemorrhage, infection, injury to adjacent viscera, thrombolic  complications, and anesthetic risks were discussed with the patient and her  family.  They wish to proceed with surgery as soon as possible.  My earliest  operating data available is August 28 which should be at the [facility].  She will come to [facility] later this week for  preoperative work-up.      De Blanch, M.D.  Electronically Signed     DC/MEDQ  D:  07/21/2006  T:  07/22/2006  Job:  [id number]   cc:   Danise Edge, M.D.  Fax: [phone]   Telford Nab, R.N.  [address]  [place], [place] [address]

## 2011-04-18 NOTE — Consult Note (Signed)
Peggy Conner, DERAMO                    ACCOUNT NO.:  [id number]   MEDICAL RECORD NO.:  [id number]          PATIENT TYPE:  OUT   LOCATION:  GYN                          FACILITY:  [facility]   PHYSICIAN:  De Blanch, M.D.DATE OF BIRTH:  07/28/1925   DATE OF CONSULTATION:  01/29/2007  DATE OF DISCHARGE:                                 CONSULTATION   CHIEF COMPLAINT:  Ovarian cancer.   INTERVAL HISTORY:  Patient returns today for continued follow-up.  She  has now completed six cycles of carboplatin and Taxol chemotherapy.  Throughout the course of therapy, her CA125 has been in a very normal  range.  The last CA125 on January 15, 2007, was 7 units/mL.  Subsequent  to completing her chemotherapy, she had a CT scan of the chest, abdomen  and pelvis on January 28, 2007, which was entirely negative.   Patient reports that she has done reasonably well through chemotherapy.  Her predominant problem is that of fatigue and limited energy and  limited endurance. She also has a modest amount of peripheral neuropathy  in her feet but none in her hands.  Overall, she has done quite nicely.   HISTORY OF PRESENT ILLNESS:  Patient initially presented with a large  left-sided pelvic mass with associated GI symptoms.  She was found to  have a stage IIB poorly differentiated adenocarcinoma measuring 8.5 cm  involving the left ovary.  She had direct invasion of the uterine  corporis and transmural invasion of the sigmoid colon with extension to  the bowel mucosal surface.  The tumor also involves sigmoid mesentery  and a rectosigmoid resection with low rectal anastomosis performed.  The  omentum and periaortic nodes were negative.  Patient received six cycles  of carboplatin and Taxol chemotherapy postoperatively.   PAST MEDICAL HISTORY:  Hypertension.   CURRENT MEDICATIONS:  Diovan and Cymbalta.   ALLERGIES:  NO KNOWN DRUG ALLERGIES.   PAST SURGICAL HISTORY:  Ovarian cancer debulking August  2007.   FAMILY HISTORY:  Negative for gynecologic, breast or colon cancer.   OBSTETRICAL HISTORY:  Gravida 2.  Patient comes accompanied by one of  her daughters today.   SOCIAL HISTORY:  The patient is married.  She is an avid Teacher, English as a foreign language.  She  does not smoke.   REVIEW OF SYSTEMS:  A 10-point comprehensive review of systems is  negative except as noted above.   PHYSICAL EXAMINATION:  GENERAL APPEARANCE:  The patient is a healthy  white female in no acute distress.  VITAL SIGNS:  Weight 134 pounds, blood pressure 145/92, pulse 80,  respiratory rate 20.  HEENT:  Negative.  NECK:  Supple without thyromegaly.  There is no supraclavicular or  inguinal adenopathy.  ABDOMEN:  Soft, nontender, no masses, organomegaly, ascites or hernias  noted.  PELVIC:  EG, BUS, vagina, bladder, urethra are normal.  Cervix and  uterus are surgically absent.  Adnexa without masses.  Rectovaginal exam  confirms.  EXTREMITIES:  Lower extremities reveal no edema or varicosities.   IMPRESSION:  1. Stage IIB grade III ovarian cancer, status post  resection and six      cycles of carboplatin and Taxol chemotherapy.  No evidence of      recurrent disease.  2. Peripheral neuropathy.   PLAN:  Patient given prescription for vitamin B6 100 mg b.i.d.  She will  see Dr. Darrold Span in three months.  I did assure her that other options  were available for managing her peripheral neuropathy if the vitamin B6  is not of help.  She is given the okay and encouragement to return to  full levels of activity including exercise and going back to her  golfing.      De Blanch, M.D.  Electronically Signed     DC/MEDQ  D:  01/29/2007  T:  01/29/2007  Job:  [id number]   cc:   Lennis P. Darrold Span, M.D.  Fax: [phone]   S. Kyra Manges, M.D.  Fax: [phone]   Georgeann Oppenheim, M.D.   Currie Paris, M.D.  [address]  [place]  [place] [address]   Telford Nab, R.N.  [phone] [address]   [place], [place] [address]

## 2011-04-18 NOTE — Consult Note (Signed)
Peggy Conner, Peggy Conner                    ACCOUNT NO.:  [id number]   MEDICAL RECORD NO.:  [id number]          PATIENT TYPE:  OUT   LOCATION:  GYN                          FACILITY:  [facility]   PHYSICIAN:  De Blanch, M.D.DATE OF BIRTH:  03-21-1925   DATE OF CONSULTATION:  10/20/2006  DATE OF DISCHARGE:                                 CONSULTATION   CHIEF COMPLAINT:  Ovarian cancer.   INTERVAL HISTORY:  The patient returns today for continuing followup.  She has now received three cycles of carboplatin and Taxol for treatment  of a stage II-B ovarian cancer.  She seems to be tolerating the  chemotherapy reasonably well, with the expected nadir fall in blood  counts, some nausea.  She does not have any neuropathy in her hands, and  overall her functional status seems to be reasonably good.   HISTORY OF PRESENT ILLNESS:  The patient underwent initial surgery on  July 28, 2006 for a large pelvic mass.  She was found to have  involvement of the left ovary with direct invasion into the uterine  corpus and transmural invasion of the sigmoid colon, including the bowel  mucosa.  All gross disease was resected, and additional staging biopsy  showed the disease was confined to the pelvis (stage IIB).   PAST MEDICAL HISTORY:   MEDICAL ILLNESSES:  Hypertension.   CURRENT MEDICATIONS:  Diovan and Cymbalta.   DRUG ALLERGIES:  None.   PAST SURGICAL HISTORY:  Ovarian cancer debulking, August 2007.   FAMILY HISTORY:  Negative gynecologic, breast, or colon cancer.   OBSTETRICAL HISTORY:  Gravida 2.   SOCIAL HISTORY:  The patient is married.  She is an avid Teacher, English as a foreign language. She  does not smoke.  She comes accompanied by her daughter today.   REVIEW OF SYSTEMS:  A 10-point comprehensive review of systems is  negative, except as noted above.   PHYSICAL EXAMINATION:  VITAL SIGNS:  Weight 130 pounds, blood pressure  130/70, pulse 80, respiratory rate 20.  GENERAL:  The patient is a healthy white  female in no acute distress.  HEENT:  Reveals alopecia.  NECK:  Supple, without thyromegaly.  ABDOMEN:  Soft, nontender.  No masses, organomegaly, ascites, or hernias  are noted.  Midline incision is well healed.  LOWER EXTREMITIES:  Without edema or varicosities.  She has no evidence  of peripheral neuropathy.   IMPRESSION:  Stage IIB ovarian cancer, doing well on chemotherapy.   PLAN:  The patient will continue six cycles of chemotherapy, and then  approximately a month after completing her six cycle, will have a  restaging CT scan.  I will plan on seeing the patient shortly after that  CT scan.  We will continue to monitor CA-125 values at each cycle of  chemotherapy.      De Blanch, M.D.  Electronically Signed     DC/MEDQ  D:  10/20/2006  T:  10/20/2006  Job:  [id number]   cc:   Currie Paris, M.D.  [address]  [place]  [place] [address]   Theressa Millard,  M.D.  Fax: [phone]   Danise Edge, M.D.  Fax: [phone]   Telford Nab, R.N.  [address]  [place], [place] [address]   Lennis P. Darrold Span, M.D.  Fax: [phone]

## 2011-04-18 NOTE — Consult Note (Signed)
NAMEMARIEL, Peggy Conner NO.:  [id number]   MEDICAL RECORD NO.:  [id number]          PATIENT TYPE:  OUT   LOCATION:  GYN                          FACILITY:  [facility]   PHYSICIAN:  De Blanch, M.D.DATE OF BIRTH:  01-18-1925   DATE OF CONSULTATION:  08/25/2006  DATE OF DISCHARGE:  08/25/2006                                   CONSULTATION   GYN/ONCOLOGY CLINIC   REFERRING PHYSICIANS:  Real Cons, M.D. and Theressa Millard,  M.D., Danise Edge, M.D., and Telford Nab, R.N.   CHIEF COMPLAINT:  Ovarian cancer.   INTERVAL HISTORY:  Since hospital discharge.  The patient has done well.  She has become very active and has gone to the club several times for  dinner.  She is eager to return to playing golf and being able to drive her  car.  She notes that she is having slightly more frequent bowel movements  which are formed.  She denies any fever, chills, or any other GI or GU  symptoms.Marland Kitchen   HISTORY OF PRESENT ILLNESS:  The patient was found to have a large left  pelvic mass and with GI symptoms, undergoing an exploratory laparotomy.  Total abdominal hysterectomy bilateral salpingo-oophorectomy rectosigmoid  resection with low rectal anastomosis omentectomy and bilateral pelvic  lymphadenectomy and peritoneal biopsies at [facility] on July 28, 2006.  She was found to have a stage IIb poorly differentiated adenocarcinoma  measuring 8.5 cm in the left ovary.  There was direct invasion into the  uterine corpus and transmural invasion of the sigmoid colon with extension  to the bowel mucosal surface.  The tumor also extensively involved the  sigmoid mesentery; the proximal and distal margins were negative at the time  of anastomosis.  She had 15 benign pericolic lymph nodes.  The omentum and  pelvic lymph nodes were free of disease as were peritoneal gutter biopsies  and peritoneal washings   PAST MEDICAL HISTORY:  Medical illnesses  hypertension.   CURRENT MEDICATIONS:  Diovan and Cymbalta.   DRUG ALLERGIES:  None.   PAST SURGICAL HISTORY:  Ovarian cancer debulking August 2007.   FAMILY HISTORY:  Negative for gynecologic breast or colon cancer.   OBSTETRICAL HISTORY:  Gravida 2   SOCIAL HISTORY:  The patient is married.  She is an avid Teacher, English as a foreign language.  She does  not smoke.   REVIEW OF SYSTEMS:  A 10-point coverage review of systems is negative except  as noted above.   PHYSICAL EXAM:  VITAL SIGNS:  Weight 127 pounds.  Blood pressure 140/80.  GENERAL:  The patient is a healthy white female in no acute distress.  HEENT is negative.  NECK:  Supple without thyromegaly.  There is no supraclavicular or inguinal  adenopathy.  ABDOMEN is soft, nontender.  No mass, organomegaly, ascites, or hernias  noted.  Incision is well-healed.  PELVIC EXAM:  EG/BUS vagina, __________, urethra are normal.  Cervix and  uterus surgically absent.  Vaginal cuff is healing well.  Bimanual exam  reveals minimal postoperative induration.  No masses are noted.  IMPRESSION:  Stage IIb adenocarcinoma of the ovary.  The patient has had  complete surgical resection.   At this juncture I would recommend the patient undergo adjuvant chemotherapy  using a combination of carboplatin and Taxol given every 3 weeks for six  cycles.  The side effects were discussed as well as the pros-and-cons of  this therapy.  She is scheduled see Dr. Jama Flavors on September 04, 2006  for initial medical oncology consultation.  I will plan on seeing the  patient back, again, in approximately 2 weeks after her second cycle of  chemotherapy.   In the interval she is given the okay to return to full levels of activity  including driving, and playing golf.      De Blanch, M.D.  Electronically Signed     DC/MEDQ  D:  08/25/2006  T:  08/27/2006  Job:  [id number]

## 2011-04-18 NOTE — Op Note (Signed)
[name], [name]:  [id number]   MEDICAL RECORD NO.:  [id number]          PATIENT TYPE:  AMB   LOCATION:  ENDO                         FACILITY:  [facility]   PHYSICIAN:  Danise Edge, M.D.   DATE OF BIRTH:  1925-05-23   DATE OF PROCEDURE:  02/25/2005  DATE OF DISCHARGE:                                 OPERATIVE REPORT   PROCEDURE:  Esophagogastroduodenoscopy,   INDICATIONS:  Ms. Peggy Conner is a 77 year old female, born September 15, 1925.  Ms. Peggy Conner has had a chronic dry cough for over 10 years.  She is a nonsmoker.  She has constant sinus drainage and takes Claritin.  She has been evaluated  by an allergist.  She has also been evaluated by Dr. Sandrea Hughs.  There is  no history of asthma.  Her cough seems to be worse when she lies down in the  evening.   September 2002, colonoscopy revealed colonic diverticulosis.  September  2002, esophagogastroduodenoscopy revealed a hiatal hernia and distal  esophageal stricture.   Since starting Aciphex, Ms. Peggy Conner's cough has diminished.  She denies  dysphagia or odynophagia.   ENDOSCOPIST:  Dr. Reece Agar   PREMEDICATION:  1.  Versed 5 mg.  2.  Demerol 50 mg.   PROCEDURE:  After obtaining informed consent, Ms. Peggy Conner was placed in the left  lateral decubitus position.  I administered intravenous Demerol and  intravenous Versed to achieve conscious sedation for the procedure.  The  patient's blood pressure, oxygen saturation, and cardiac rhythm were  monitored throughout the procedure and documented in the medical record.   The Olympus gastroscope was passed through the posterior hypopharynx into  the proximal esophagus without difficulty.  The hypopharynx, larynx, and  vocal cords appeared normal.   Esophagoscopy:  The proximal and mid segments of the esophageal mucosa  appeared normal.  The distal esophageal mucosa is normal.  There is a  shallow, nonobstructing Schatzki's ring at the esophagogastric junction  which is noted at 37 cm from the incisor teeth.  There is no endoscopic  evidence for the presence of Barrett's esophagus, erosive esophagitis.   Gastroscopy:  Ms. Peggy Conner has a large hiatal hernia.  Retroflexed view of the  gastric cardia and fundus was normal.  The diaphragmatic hiatus is patulous.  The gastric body, antrum, and pylorus appear normal except for the presence  of mucosal red in the antrum.  The pylorus appears normal.   Duodenoscopy:  The duodenal bulb and descending duodenum appear normal.   ASSESSMENT:  Chronic gastroesophageal reflux associated with a hiatal hernia  and shallow Schatzki's ring at the esophagogastric junction.  No endoscopic  evidence for the presence of Barrett's esophagus or erosive esophagitis.      MJ/MEDQ  D:  02/25/2005  T:  02/25/2005  Job:  [id number]   cc:   Theressa Millard, M.D.  [address] [place]  [place] [address]  Fax: [phone]

## 2011-04-18 NOTE — Op Note (Signed)
Peggy Conner, Peggy Conner                              ACCOUNT NO.:  [id number]   MEDICAL RECORD NO.:  [id number]                   PATIENT TYPE:  AMB   LOCATION:  DSC                                  FACILITY:  [facility]   PHYSICIAN:  Katy Fitch. Naaman Plummer., M.D.          DATE OF BIRTH:  July 24, 1925   DATE OF PROCEDURE:  11/16/2003  DATE OF DISCHARGE:                                 OPERATIVE REPORT   PREOPERATIVE DIAGNOSIS:  1. Chronic entrapment neuropathy median nerve, right carpal tunnel.  2. Pantrapezial arthrosis, right thumb.  3. 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   POSTOPERATIVE DIAGNOSIS:  1. Chronic entrapment neuropathy median nerve, right carpal tunnel.  2. Pantrapezial arthrosis, right thumb.  3. 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   OPERATION:  1. Right trapezium excision with abductor pollicis longus suspension plasty     for soft tissue arthroplasty.  2. Metacarpal phalangeal volar plate plication utilizing a 2.4 mm     bioabsorbable anchor and 2-0 suture to create a volar plate plication     limiting extension of the right thumb MP joint at a 20 degree flexion     contracture.  3. Release of right transverse carpal ligament through a separate palmar     incision.   SURGEON:  Katy Fitch. Sypher, M.D.   ASSISTANT:  Jonni Sanger, P.A.   ANESTHESIA:  General by LMA.   ANESTHESIOLOGIST:  Janetta Hora. Gelene Mink, M.D.   INDICATIONS FOR PROCEDURE:  Peggy Conner is a 77 year old woman who presented  for evaluation and management of a painful right thumb CMC joint and a Z-  collapse of her right thumb as well as chronic numbness and loss of  sensibility in her right hand.  Clinical examination confirmed pantrapezial  arthrosis, a type 1 Z collapse of the thumb, and significant carpal tunnel  syndrome documented by  electrodiagnostic studies.  We had followed Peggy Conner  for this arthritis predicament for 11 years.  She has now reached the point  where she cannot perform household activities nor enjoy her recreational  golf game due to pain.  She requested reconstructive surgery.  Preoperatively, she was informed of the potential complications including  reflex sympathetic dystrophy, infection, and rupture of the soft tissue  constructs.   PROCEDURE:  Peggy Conner is brought to the operating room and placed on supine  position on the operating table.  Following the induction of general  anesthesia by LMA, the right arm was prepped with Betadine solution and  sterilely draped.  1 gram of Ancef was administered as an IV prophylactic  antibiotic.  The procedure commended with exsanguination of the right arm  with an Esmarch bandage, inflation of an arterial tourniquet on the proximal  brachium to 220 mmHg.  The procedure commenced with a curvilinear incision  at the base of the thumb.  The subcutaneous tissues were carefully divided  taking care to gently dissect free the radial sensory branches.  The  interval between the abductor pollicis longus tendon slips and the extensor  pollicis brevis was sharply incised revealing the capsule of the CMC joint.  With the aid of a 624 beaver blade and a sharp 4 mm osteotome, the trapezium  was exposed subperiosteally.  The trapezium was then morselized and removed  piecemeal with a rongeur.  A complete synovectomy of the [facility] joint was  accomplished.  After irrigation to remove all bone fragments, a drill hole  was created to the base of the index metacarpal distal to the facet of the  articular surface for the thumb metacarpal.  This was enlarged to 3.5 mm  with sequential hand drilling.  A second drill hole was created through the  base of the thumb metacarpal on a 45 degree angle from 1 cm distal to the  articular surface on the dorsal aspect of the metacarpal base to  the central  portion of the articular surface proximally.  This was also enlarged to 3.5  mm.   The central slip of the abductor pollicis longus was harvested at the  musculotendinous junction through a separate incision, passed distally to  the first dorsal compartment, reversed 180 degrees, and brought through the  base of the thumb metacarpal from distal to proximal and up to the drill  hole index finger to the base of the index metacarpal ulnar aspect.  Care  was taken to dissect free the artery to the first metacarpal and to be  certain that this was not compressed by the tendon transfer.  The tendon  transfer was passed beneath the first metacarpal artery and then woven with  a three pass Pulvertaft weave into the extensor carpi radialis brevis.  This  was tensioned to appropriately suspend the thumb in an anatomic height.  Care was taken to fully adduct the thumb to prevent excessive tightness.  The wounds were then irrigated with sterile saline and triple antibiotic  solution.  The Pulvertaft weave was secured by multiple corner sutures of 3-  0 Ethibond.  Excess tendon was trimmed.   Thereafter, the MP joint of the thumb was inspected and found to have at  least 40 degrees of hyperextension.  Therefore, a Brunner zig-zag incision  was used to expose the flexor sheath at the level of the A1 pulley and volar  plate of the MP joint.  The retinaculum was incised along its radial aspect  and the flexor pollicis longus tendon retracted in an ulnar direction.  A  block of the volar plate measuring 5 mm in length and approximately 5 mm in  width was excised between the sesamoids and a 2.4 mm biocorkscrew anchor was  placed in the neck of the metacarpal and used to plicate the volar plate.  The joint was flexed 30 degrees and a mattress suture placed repairing the  volar plate to the neck of the metacarpal.  This was then used to oversewn with a mattress suture volar plate to volar plate  creating a shortened and  more durable volar plate tenodesis.  The wound was then irrigated and  repaired with intradermal 3-0 Prolene.   Attention was then directed to the palm where another incision was fashioned  in the line with the ring finger.  The subcutaneous  tissues were carefully  divided revealing the palmar fascia.  This was split longitudinally to  reveal the common sensory branch of the median nerve and the superficial  palmar arch.  The median nerve was identified at the level of the transverse  carpal ligament, separated from the ligament, followed by release of the  ligament with scissors extending into the distal forearm.  This widely  opened the carpal canal.  No masses or other predicaments were noted.  Bleeding points along the margins of the released ligament were  electrocauterized with bipolar current followed by repair of the skin with  intradermal 3-0 Prolene suture.  The repair site for the suspension plasty  was repaired with intradermal 3-0 Prolene and Steri-Strips.   All wounds were infiltrated with postoperative Marcaine for analgesia  followed by release of the tourniquet.  The hand was immobilized in a  voluminous gauze dressing with a thumb spica splint maintaining the thumb in  palmar abduction and the MP joint in about 20 degrees flexion.  There were  no apparent complications.  Ms. Fort tolerated  the surgery and anesthesia well.  She was transferred to the recovery room  with stable vital signs.  She will be discharged to the recovery care center  for observation of her vital signs and prophylactic antibiotics in the form  of Ancef 1 gram IV q.8h.  She will be given PCA morphine and IV Dilaudid.                                               Katy Fitch Naaman Plummer., M.D.    RVS/MEDQ  D:  11/16/2003  T:  11/16/2003  Job:  [id number]   cc:   Theressa Millard, M.D.  [address] [place]  [place] [address]  Fax: [phone]

## 2011-08-29 LAB — BASIC METABOLIC PANEL
BUN: 14
BUN: 12
CO2: 28
CO2: 31
CO2: 28
CO2: 30
CO2: 30
Calcium: 8 — ABNORMAL LOW
Calcium: 8.1 — ABNORMAL LOW
Calcium: 8.3 — ABNORMAL LOW
Chloride: 102
Chloride: 96
Creatinine, Ser: 0.61
Creatinine, Ser: 0.61
Creatinine, Ser: 0.44
GFR calc Af Amer: >60
Glucose, Bld: 126 — ABNORMAL HIGH
Glucose, Bld: 118 — ABNORMAL HIGH
Glucose, Bld: 102 — ABNORMAL HIGH
Glucose, Bld: 140 — ABNORMAL HIGH
Potassium: 4.2
Potassium: 4.5
Sodium: 130 — ABNORMAL LOW

## 2011-08-29 LAB — CBC
HCT: 39.6
HCT: 40
HCT: 29.4 — ABNORMAL LOW
Hemoglobin: 14.1
Hemoglobin: 11.4 — ABNORMAL LOW
MCHC: 33.9
MCHC: 34.1
MCHC: 35.3
MCHC: 34.7
MCV: 92.9
MCV: 91.1
MCV: 93.8
Platelets: 308
Platelets: 302
Platelets: 241
Platelets: 249
RBC: 3.73 — ABNORMAL LOW
RBC: 4.4
RDW: 13
RDW: 12.5
RDW: 13
RDW: 13
WBC: 6.3

## 2011-08-29 LAB — DIFFERENTIAL
Basophils Absolute: 0
Basophils Relative: 1
Eosinophils Absolute: 0
Eosinophils Relative: 1
Lymphocytes Relative: 7 — ABNORMAL LOW
Lymphs Abs: 0.4 — ABNORMAL LOW
Monocytes Absolute: 0.3
Monocytes Relative: 5
Neutro Abs: 5.6
Neutrophils Relative %: 87 — ABNORMAL HIGH

## 2011-08-29 LAB — BASIC METABOLIC PANEL WITH GFR
BUN: 14
Calcium: 9.4
Creatinine, Ser: 0.7
GFR calc Af Amer: >60
GFR calc non Af Amer: >60
Sodium: 132 — ABNORMAL LOW

## 2011-08-29 LAB — PROTIME-INR
INR: 1.2
INR: 1.6 — ABNORMAL HIGH
Prothrombin Time: 13.7
Prothrombin Time: 15.1

## 2011-08-29 LAB — APTT: aPTT: 30

## 2011-08-29 LAB — URINALYSIS, ROUTINE W REFLEX MICROSCOPIC
Bilirubin Urine: NEGATIVE
Glucose, UA: NEGATIVE
Hgb urine dipstick: NEGATIVE
Ketones, ur: NEGATIVE
Nitrite: NEGATIVE
Protein, ur: NEGATIVE
Specific Gravity, Urine: 1.007
Urobilinogen, UA: 0.2
pH: 7.5

## 2011-09-05 ENCOUNTER — Ambulatory Visit: Payer: MEDICARE | Attending: Gynecology | Admitting: Gynecology

## 2011-12-03 ENCOUNTER — Encounter: Payer: Self-pay | Admitting: Gynecologic Oncology

## 2011-12-05 ENCOUNTER — Ambulatory Visit: Payer: MEDICARE | Admitting: Gynecology

## 2011-12-11 ENCOUNTER — Encounter: Payer: Self-pay | Admitting: Gynecologic Oncology

## 2011-12-15 ENCOUNTER — Ambulatory Visit: Payer: Medicare Other

## 2011-12-15 ENCOUNTER — Encounter: Payer: Self-pay | Admitting: Gynecology

## 2011-12-15 ENCOUNTER — Ambulatory Visit: Payer: Medicare Other | Attending: Gynecology | Admitting: Gynecology

## 2011-12-15 VITALS — BP 128/70 | HR 78 | Temp 98.6°F | Resp 16 | Ht 63.78 in | Wt 142.9 lb

## 2011-12-15 DIAGNOSIS — G609 Hereditary and idiopathic neuropathy, unspecified: Secondary | ICD-10-CM | POA: Insufficient documentation

## 2011-12-15 DIAGNOSIS — Z79899 Other long term (current) drug therapy: Secondary | ICD-10-CM | POA: Insufficient documentation

## 2011-12-15 DIAGNOSIS — Z9221 Personal history of antineoplastic chemotherapy: Secondary | ICD-10-CM | POA: Insufficient documentation

## 2011-12-15 DIAGNOSIS — Z801 Family history of malignant neoplasm of trachea, bronchus and lung: Secondary | ICD-10-CM | POA: Insufficient documentation

## 2011-12-15 DIAGNOSIS — I10 Essential (primary) hypertension: Secondary | ICD-10-CM | POA: Insufficient documentation

## 2011-12-15 DIAGNOSIS — C569 Malignant neoplasm of unspecified ovary: Secondary | ICD-10-CM | POA: Insufficient documentation

## 2011-12-15 LAB — CA 125: CA 125: 5.3 U/mL (ref 0.0–30.2)

## 2011-12-15 NOTE — Patient Instructions (Signed)
CA125 Get an appointment with Dr. Nigel Sloop at [facility] to evaluate incontinence

## 2011-12-15 NOTE — Progress Notes (Signed)
Consult Note: Gyn-Onc   Peggy Conner 78 y.o. female  Chief Complaint  Patient presents with  . Ovarian Cancer    Follow up    Interval History: The patient returns today as previously scheduled now slightly past 5 years since initial treatment for stage IIb poorly differentiated ovarian cancer. Since her last visit she's done well and just past her 87th birthday yesterday. She denies any GI symptoms. Her primary complaint is that of urinary incontinence. She also feels very fatigued.  HPI: Stage to be poorly differentiated ovarian cancer undergoing initial surgery August 2007. Postoperatively she received 6 cycles of carboplatin and Taxol chemotherapy completed in January 2008. She's been followed since that time with no evidence recurrent disease.  No Known Allergies  Past Medical History  Diagnosis Date  . Hypertension   . Depression   . Ovarian cancer   . Peripheral neuropathy     Past Surgical History  Procedure Date  . Hip pinning 2009  . Ovarian cancer debulking  2007    Current Outpatient Prescriptions  Medication Sig Dispense Refill  . Cyanocobalamin (VITAMIN B-12 PO) Take by mouth daily.       . diclofenac sodium (VOLTAREN) 1 % GEL Apply 1 application topically daily.      . DULoxetine HCl (CYMBALTA PO) Take 60 mg by mouth daily.       Marland Kitchen GABAPENTIN PO Take 300 mg by mouth daily.       . Glucosamine-Chondroitin (GLUCOSAMINE CHONDR COMPLEX PO) Take by mouth daily.       . Multiple Vitamins-Minerals (MULTIVITAMIN PO) Take 1 tablet by mouth daily.        Marland Kitchen PYRIDOXINE HCL PO Take by mouth daily.       . Valsartan (DIOVAN PO) Take 320 mg by mouth daily.       . Diclofenac Sodium (VOLTAREN PO) Take by mouth.         History   Social History  . Marital Status: Married    Spouse Name: N/A    Number of Children: N/A  . Years of Education: N/A   Occupational History  . Not on file.   Social History Main Topics  . Smoking status: Never Smoker   . Smokeless tobacco:  Not on file  . Alcohol Use: Yes     occas  . Drug Use: No  . Sexually Active: Not on file   Other Topics Concern  . Not on file   Social History Narrative  . No narrative on file    Family History  Problem Relation Age of Onset  . Lung cancer Brother     Review of Systems: 10 point review of systems negative except as noted above  Vitals: Blood pressure 128/70, pulse 78, temperature 98.6 F (37 C), resp. rate 16, height 5' 3.78" (1.62 m), weight 142 lb 14.4 oz (64.819 kg).  Physical Exam: In general the patient healthy elderly white female no acute distress  HEENT is negative  Neck is supple without thyromegaly  There is no supraclavicular or inguinal adenopathy  The abdomen is soft nontender no masses organomegaly or ascites are noted.  Pelvic exam  EGBUS vagina bladder urethra are normal cervix and uterus are surgically absent  Bimanual rectovaginal exam revealed no adnexal masses. Rectovaginal exam confirms  Lower extremities and mild varicosities    Assessment/Plan: Stage II B differentiated ovarian cancer now at 5 years of followup. Patient's clinically free of disease  CA 125 will be obtained today  At  this juncture were released the patient to her primary care physician for continuing health maintenance.  With regard to her urinary incontinence she would like to see a urogynecologist at [facility]. I've given her the name of Dr. Gabriel Cirri, MD 12/15/2011, 12:20 PM                         Consult Note: Gyn-Onc   Peggy Conner 78 y.o. female  Chief Complaint  Patient presents with  . Ovarian Cancer    Follow up    Interval History:   HPI:  No Known Allergies  Past Medical History  Diagnosis Date  . Hypertension   . Depression   . Ovarian cancer   . Peripheral neuropathy     Past Surgical History  Procedure Date  . Hip pinning 2009  . Ovarian cancer debulking  2007    Current Outpatient  Prescriptions  Medication Sig Dispense Refill  . Cyanocobalamin (VITAMIN B-12 PO) Take by mouth daily.       . diclofenac sodium (VOLTAREN) 1 % GEL Apply 1 application topically daily.      . DULoxetine HCl (CYMBALTA PO) Take 60 mg by mouth daily.       Marland Kitchen GABAPENTIN PO Take 300 mg by mouth daily.       . Glucosamine-Chondroitin (GLUCOSAMINE CHONDR COMPLEX PO) Take by mouth daily.       . Multiple Vitamins-Minerals (MULTIVITAMIN PO) Take 1 tablet by mouth daily.        Marland Kitchen PYRIDOXINE HCL PO Take by mouth daily.       . Valsartan (DIOVAN PO) Take 320 mg by mouth daily.       . Diclofenac Sodium (VOLTAREN PO) Take by mouth.         History   Social History  . Marital Status: Married    Spouse Name: N/A    Number of Children: N/A  . Years of Education: N/A   Occupational History  . Not on file.   Social History Main Topics  . Smoking status: Never Smoker   . Smokeless tobacco: Not on file  . Alcohol Use: Yes     occas  . Drug Use: No  . Sexually Active: Not on file   Other Topics Concern  . Not on file   Social History Narrative  . No narrative on file    Family History  Problem Relation Age of Onset  . Lung cancer Brother     Review of Systems:  Vitals: Blood pressure 128/70, pulse 78, temperature 98.6 F (37 C), resp. rate 16, height 5' 3.78" (1.62 m), weight 142 lb 14.4 oz (64.819 kg).  Physical Exam:  Assessment/Plan:   [name], MD 12/15/2011, 12:20 PM

## 2011-12-16 ENCOUNTER — Telehealth: Payer: Self-pay | Admitting: Gynecologic Oncology

## 2011-12-16 NOTE — Telephone Encounter (Signed)
Pt notified of CA 125 results.  No concerns or questions voiced.

## 2012-05-24 ENCOUNTER — Other Ambulatory Visit: Payer: Self-pay | Admitting: Internal Medicine

## 2012-05-24 ENCOUNTER — Ambulatory Visit
Admission: RE | Admit: 2012-05-24 | Discharge: 2012-05-24 | Disposition: A | Discharge status: Home / Self Care | Payer: Medicare Other | Source: Ambulatory Visit | Attending: Internal Medicine | Admitting: Internal Medicine

## 2012-05-24 DIAGNOSIS — S0990XA Unspecified injury of head, initial encounter: Secondary | ICD-10-CM

## 2012-10-04 ENCOUNTER — Ambulatory Visit (INDEPENDENT_AMBULATORY_CARE_PROVIDER_SITE_OTHER): Payer: Medicare Other | Admitting: Family Medicine

## 2012-10-04 VITALS — BP 144/88 | HR 78 | Resp 16 | Ht 63.0 in | Wt 144.0 lb

## 2012-10-04 DIAGNOSIS — R040 Epistaxis: Secondary | ICD-10-CM

## 2012-10-04 LAB — POCT CBC
Granulocyte percent: 58.1 % (ref 37–80)
HCT, POC: 44.1 % (ref 37.7–47.9)
Hemoglobin: 13.7 g/dL (ref 12.2–16.2)
Lymph, poc: 2.5 (ref 0.6–3.4)
MCH, POC: 29.8 pg (ref 27–31.2)
MCHC: 31.1 g/dL — AB (ref 31.8–35.4)
MCV: 95.9 fL (ref 80–97)
MID (cbc): 0.8 (ref 0–0.9)
MPV: 7.3 fL (ref 0–99.8)
POC Granulocyte: 4.5 (ref 2–6.9)
POC LYMPH PERCENT: 31.7 %L (ref 10–50)
POC MID %: 10.2 % (ref 0–12)
Platelet Count, POC: 424 10*3/uL (ref 142–424)
RBC: 4.6 M/uL (ref 4.04–5.48)
RDW, POC: 13.7 %
WBC: 7.8 10*3/uL (ref 4.6–10.2)

## 2012-10-04 NOTE — Progress Notes (Signed)
[facility]:  Office Visit  Chief Complaint:  Chief Complaint  Patient presents with  . Epistaxis    today/ spitting up blood clots    HPI: Peggy Conner is a 78 y.o. female who complains of  4 episodes of nose bleed after sneezing. Not on chronic  blood thinners, no ASA since 5 days ago. + sneezing, allergies and has turned heat on so very dry in house. Denies nose picking.  Past Medical History  Diagnosis Date  . Hypertension   . Depression   . Ovarian cancer   . Peripheral neuropathy    Past Surgical History  Procedure Date  . Hip pinning 2009  . Ovarian cancer debulking  2007   History   Social History  . Marital Status: Married    Spouse Name: N/A    Number of Children: N/A  . Years of Education: N/A   Social History Main Topics  . Smoking status: Never Smoker   . Smokeless tobacco: Not on file  . Alcohol Use: Yes     Comment: occas  . Drug Use: No  . Sexually Active: Not on file   Other Topics Concern  . Not on file   Social History Narrative  . No narrative on file   Family History  Problem Relation Age of Onset  . Lung cancer Brother    No Known Allergies Prior to Admission medications   Medication Sig Start Date End Date Taking? Authorizing Provider  Cyanocobalamin (VITAMIN B-12 PO) Take by mouth daily.    Yes Historical Provider, MD  diclofenac sodium (VOLTAREN) 1 % GEL Apply 1 application topically daily.   Yes Historical Provider, MD  DULoxetine HCl (CYMBALTA PO) Take 60 mg by mouth daily.    Yes Historical Provider, MD  GABAPENTIN PO Take 300 mg by mouth daily.    Yes Historical Provider, MD  Glucosamine-Chondroitin (GLUCOSAMINE CHONDR COMPLEX PO) Take by mouth daily.    Yes Historical Provider, MD  Multiple Vitamins-Minerals (MULTIVITAMIN PO) Take 1 tablet by mouth daily.     Yes Historical Provider, MD  Valsartan (DIOVAN PO) Take 320 mg by mouth daily.    Yes Historical Provider, MD     ROS: The patient denies fevers,  chills, night sweats, unintentional weight loss, chest pain, palpitations, wheezing, dyspnea on exertion, nausea, vomiting, abdominal pain, dysuria, hematuria, melena, numbness, weakness, or tingling.   All other systems have been reviewed and were otherwise negative with the exception of those mentioned in the HPI and as above.    PHYSICAL EXAM: Filed Vitals:   10/04/12 1614  BP: 144/88  Pulse: 78  Resp: 16   Filed Vitals:   10/04/12 1614  Height: 5\' 3"  (1.6 m)  Weight: 144 lb (65.318 kg)   Body mass index is 25.51 kg/(m^2).  General: Alert, no acute distress HEENT:  Normocephalic, atraumatic, oropharynx patent. No clots visible in posterior airway, + kiesselbach plexus bleeding left nasal septum. Cardiovascular:  Regular rate and rhythm, no rubs murmurs or gallops.  No Carotid bruits, radial pulse intact. No pedal edema.  Respiratory: Clear to auscultation bilaterally.  No wheezes, rales, or rhonchi.  No cyanosis, no use of accessory musculature GI: No organomegaly, abdomen is soft and non-tender, positive bowel sounds.  No masses. Skin: No rashes. Neurologic: Facial musculature symmetric. Psychiatric: Patient is appropriate throughout our interaction. Lymphatic: No cervical lymphadenopathy Musculoskeletal: Gait intact.   LABS: Results for orders placed in visit on 10/04/12  POCT CBC  Component Value Range   WBC 7.8  4.6 - 10.2 K/uL   Lymph, poc 2.5  0.6 - 3.4   POC LYMPH PERCENT 31.7  10 - 50 %L   MID (cbc) 0.8  0 - 0.9   POC MID % 10.2  0 - 12 %M   POC Granulocyte 4.5  2 - 6.9   Granulocyte percent 58.1  37 - 80 %G   RBC 4.60  4.04 - 5.48 M/uL   Hemoglobin 13.7  12.2 - 16.2 g/dL   HCT, POC 16.1  09.6 - 47.9 %   MCV 95.9  80 - 97 fL   MCH, POC 29.8  27 - 31.2 pg   MCHC 31.1 (*) 31.8 - 35.4 g/dL   RDW, POC 04.5     Platelet Count, POC 424  142 - 424 K/uL   MPV 7.3  0 - 99.8 fL     EKG/XRAY:   Primary read interpreted by Dr. Conley Rolls at  [facility].   ASSESSMENT/PLAN: Encounter Diagnosis  Name Primary?  . Bleeding nose Yes   Anterior nose bleed Cauterized left nasal septum where vessels were bleeding however this actually increase bleeding Gave lidocaine with epi which slowed bleeding but did not stop it.  Rapid Rhino-packed nose x 2 which stopped bleeding Use OTC Afrin and gauze packing prn CBC normal range     Hero Mccathern PHUONG, DO 10/04/2012 6:05 PM

## 2013-01-03 ENCOUNTER — Ambulatory Visit: Payer: Medicare Other | Admitting: Physical Therapy

## 2013-01-10 ENCOUNTER — Ambulatory Visit: Payer: Medicare Other | Admitting: Physical Therapy

## 2013-01-13 ENCOUNTER — Ambulatory Visit: Payer: Medicare Other | Attending: Orthopedic Surgery

## 2013-07-29 ENCOUNTER — Ambulatory Visit: Payer: Medicare Other | Admitting: Gynecology

## 2014-04-14 ENCOUNTER — Ambulatory Visit: Payer: Medicare Other | Admitting: Gynecology

## 2014-05-31 ENCOUNTER — Encounter: Payer: Self-pay | Admitting: *Deleted

## 2014-06-15 ENCOUNTER — Other Ambulatory Visit: Payer: Self-pay | Admitting: Nurse Practitioner

## 2014-06-15 ENCOUNTER — Ambulatory Visit
Admission: RE | Admit: 2014-06-15 | Discharge: 2014-06-15 | Disposition: A | Discharge status: Home / Self Care | Payer: Medicare Other | Source: Ambulatory Visit | Attending: Nurse Practitioner | Admitting: Nurse Practitioner

## 2014-06-15 DIAGNOSIS — R059 Cough, unspecified: Secondary | ICD-10-CM

## 2014-06-15 DIAGNOSIS — M25519 Pain in unspecified shoulder: Secondary | ICD-10-CM

## 2014-06-15 DIAGNOSIS — R05 Cough: Secondary | ICD-10-CM

## 2014-08-28 ENCOUNTER — Telehealth: Payer: Self-pay | Admitting: *Deleted

## 2014-08-28 NOTE — Telephone Encounter (Signed)
Dr. Jannifer Franklin will be out of office on 08/31/14 pm only. Appointment needs to be rescheduled.

## 2014-08-31 ENCOUNTER — Institutional Professional Consult (permissible substitution): Payer: Medicare Other | Admitting: Neurology

## 2014-08-31 ENCOUNTER — Ambulatory Visit: Payer: Medicare Other | Admitting: Neurology

## 2014-09-05 ENCOUNTER — Institutional Professional Consult (permissible substitution): Payer: Medicare Other | Admitting: Neurology

## 2014-09-05 ENCOUNTER — Encounter: Payer: Self-pay | Admitting: *Deleted

## 2014-09-06 ENCOUNTER — Encounter (INDEPENDENT_AMBULATORY_CARE_PROVIDER_SITE_OTHER): Payer: Self-pay

## 2014-09-06 ENCOUNTER — Encounter: Payer: Self-pay | Admitting: Neurology

## 2014-09-06 ENCOUNTER — Ambulatory Visit (INDEPENDENT_AMBULATORY_CARE_PROVIDER_SITE_OTHER): Payer: Medicare Other | Admitting: Neurology

## 2014-09-06 VITALS — BP 128/77 | HR 85 | Ht 64.0 in | Wt 150.2 lb

## 2014-09-06 DIAGNOSIS — E538 Deficiency of other specified B group vitamins: Secondary | ICD-10-CM

## 2014-09-06 DIAGNOSIS — R413 Other amnesia: Secondary | ICD-10-CM

## 2014-09-06 DIAGNOSIS — R269 Unspecified abnormalities of gait and mobility: Secondary | ICD-10-CM | POA: Insufficient documentation

## 2014-09-06 DIAGNOSIS — G622 Polyneuropathy due to other toxic agents: Secondary | ICD-10-CM | POA: Insufficient documentation

## 2014-09-06 HISTORY — DX: Unspecified abnormalities of gait and mobility: R26.9

## 2014-09-06 HISTORY — DX: Other amnesia: R41.3

## 2014-09-06 MED ORDER — DULOXETINE HCL 30 MG PO CPEP: 30.0000 mg | ORAL_CAPSULE | Freq: Every day | ORAL | Status: DC

## 2014-09-06 NOTE — Progress Notes (Signed)
Reason for visit: Peripheral neuropathy, memory problems  Peggy Conner is a 80 y.o. female  History of present illness:  Peggy Conner is an 80 year old right-handed white female with a history of ovarian cancer, status post chemotherapy in 2008. The patient indicates that she developed a peripheral neuropathy following this therapy, and she has had some numbness and burning sensations in her feet since that time. Within the last 4 weeks, the patient has also developed some numbness in the hands, with achy sensations in the elbows and shoulders. The patient does have a balance issue, but she denies any falls. The patient uses a cane for ambulation. The patient is on gabapentin which does help, but she took herself off of Cymbalta. Since being off of Cymbalta, she has had some issues with depression. The patient lost her husband in July 2015. She goes on to say that she has had some issues with short-term memory, and she does not recall the funeral for her husband well. The patient indicates that she has had memory problems for one year, but our medical records indicate that she was seen by Dr. Erling Cruz in February of 2012 for memory issues. The patient has short-term memory issues, she is having some problems with directions while driving, and she is having some issues misplacing things about the house and keeping up with appointments and medications. Her daughter now does the finances, and she has been doing this since the death of the patient's husband in July 2015. MRI evaluation of the brain at that time showed moderate to severe small vessel ischemic changes in the paraventricular and brainstem area. The patient has been on low-dose aspirin. The MRI study was reviewed on line. The patient denies any issues controlling the bowels or the bladder. She denies any neck or low back pain. She currently is not on any medication for memory. She is sent to this office for an evaluation. She does not believe that the  numbness issues in the legs have progressed since the chemotherapy.  Past Medical History  Diagnosis Date  . Hypertension   . Depression   . Ovarian cancer     s/p surgery and chemothearpy  . Peripheral neuropathy   . Arthritis of foot   . Anxiety   . Memory difficulty 09/06/2014  . Gait disorder 09/06/2014  . HOH (hard of hearing)     hearing aid, history of tinnitus    Past Surgical History  Procedure Laterality Date  . Hip pinning  2009  . Ovarian cancer debulking   2007    Family History  Problem Relation Age of Onset  . Lung cancer Brother     Social history:  reports that she has never smoked. She has never used smokeless tobacco. She reports that she drinks alcohol. She reports that she does not use illicit drugs.  Medications:  Current Outpatient Prescriptions on File Prior to Visit  Medication Sig Dispense Refill  . aspirin 81 MG tablet Take 81 mg by mouth daily.      . diclofenac sodium (VOLTAREN) 1 % GEL Apply 1 application topically daily.      . Glucosamine-Chondroitin (GLUCOSAMINE CHONDR COMPLEX PO) Take by mouth daily.       Marland Kitchen losartan (COZAAR) 100 MG tablet Take 50 mg by mouth daily. 1/2 tab daily      . Melatonin 5 MG TABS Take by mouth daily.      . Omega-3 Fatty Acids (FISH OIL) 1200 MG CAPS Take 1 capsule  by mouth daily.      . ranitidine (ZANTAC) 150 MG capsule Take 150 mg by mouth 2 (two) times daily.      . clonazePAM (KLONOPIN) 0.5 MG tablet Take 0.5 mg by mouth daily.       No current facility-administered medications on file prior to visit.     No Known Allergies  ROS:  Out of a complete 14 system review of symptoms, the patient complains only of the following symptoms, and all other reviewed systems are negative.  Fatigue Hearing loss, ringing in the ears Cough Diarrhea Urination problems, incontinence of the bladder Feeling hot, cold Memory loss, confusion, numbness Depression, anxiety, too much sleep, decreased energy, disinterest  in activities Restless legs  Blood pressure 128/77, pulse 85, height 5\' 4"  (1.626 m), weight 150 lb 3.2 oz (68.13 kg).  Physical Exam  General: The patient is alert and cooperative at the time of the examination.  Eyes: Pupils are equal, round, and reactive to light. Discs are flat bilaterally.  Neck: The neck is supple, no carotid bruits are noted.  Respiratory: The respiratory examination is clear.  Cardiovascular: The cardiovascular examination reveals a regular rate and rhythm, no obvious murmurs or rubs are noted.  Skin: Extremities are without significant edema.  Neurologic Exam  Mental status: The Mini-Mental status examination done today shows a total score 25/30. The patient scores 7 on the animal fluency test.  Cranial nerves: Facial symmetry is present. There is good sensation of the face to pinprick and soft touch bilaterally. The strength of the facial muscles and the muscles to head turning and shoulder shrug are normal bilaterally. Speech is well enunciated, no aphasia or dysarthria is noted. Extraocular movements are full. Visual fields are full. The tongue is midline, and the patient has symmetric elevation of the soft palate. No obvious hearing deficits are noted.  Motor: The motor testing reveals 5 over 5 strength of all 4 extremities, with the exception that the APB muscles are weak bilaterally with bilateral Tinel's signs at the wrists bilaterally. Good symmetric motor tone is noted throughout.  Sensory: Sensory testing is intact to pinprick, soft touch, vibration sensation, and position sense on all 4 extremities, with the exception that position sense in the legs is decreased. No evidence of extinction is noted.  Coordination: Cerebellar testing reveals good finger-nose-finger and heel-to-shin bilaterally.  Gait and station: Gait is normal. Tandem gait is slightly unsteady. Romberg is negative. No drift is seen.  Reflexes: Deep tendon reflexes are symmetric and  normal bilaterally. Toes are downgoing bilaterally.    MRI brain 01/28/14:  Impression: This MRI scan of the brain shows moderate changes of chronic microvascular ischemia and generalized cerebral atrophy.   Assessment/Plan:  1. Progressive memory disturbance  2. Peripheral neuropathy, status post chemotherapy  3. Bilateral hand numbness, possible carpal tunnel syndrome  4. Gait disorder  5. Extensive small vessel ischemic changes by MRI brain  The patient has had a progressive memory disorder since 2012. MRI of the brain has shown significant small vessel ischemic changes that may be a contributing factor with balance and the memory. The patient is amenable to going on a medication for memory. The patient has had some issues with depression, and she has problems with her peripheral neuropathy discomfort, and we will place her back on low-dose Cymbalta. The patient will have blood work today, and she will be set up for another MRI the brain to compare to the prior study done 3 years ago. The  patient will followup for nerve conduction studies on both arms, and one leg, EMG study will be done on one arm. The patient may have bilateral carpal tunnel syndrome. The patient does report some issues with directions while driving, and this issue will need to be scrutinized closely in the near future.  Jill Alexanders MD 09/06/2014 10:16 PM  [facility] [address] [place], [place] [address]  Phone [phone] Fax [phone]

## 2014-09-06 NOTE — Patient Instructions (Signed)

## 2014-09-08 LAB — RPR: RPR: NONREACTIVE

## 2014-09-08 LAB — COPPER, SERUM: Copper: 96 ug/dL (ref 72–166)

## 2014-09-08 LAB — VITAMIN B12: Vitamin B-12: 539 pg/mL (ref 211–946)

## 2014-09-14 ENCOUNTER — Encounter: Payer: Self-pay | Admitting: Neurology

## 2014-09-14 ENCOUNTER — Encounter (INDEPENDENT_AMBULATORY_CARE_PROVIDER_SITE_OTHER): Payer: Self-pay

## 2014-09-14 ENCOUNTER — Ambulatory Visit (INDEPENDENT_AMBULATORY_CARE_PROVIDER_SITE_OTHER): Payer: Medicare Other | Admitting: Neurology

## 2014-09-14 DIAGNOSIS — Z0289 Encounter for other administrative examinations: Secondary | ICD-10-CM

## 2014-09-14 DIAGNOSIS — R413 Other amnesia: Secondary | ICD-10-CM

## 2014-09-14 DIAGNOSIS — R269 Unspecified abnormalities of gait and mobility: Secondary | ICD-10-CM

## 2014-09-14 DIAGNOSIS — G5602 Carpal tunnel syndrome, left upper limb: Secondary | ICD-10-CM

## 2014-09-14 DIAGNOSIS — G5601 Carpal tunnel syndrome, right upper limb: Secondary | ICD-10-CM

## 2014-09-14 DIAGNOSIS — G622 Polyneuropathy due to other toxic agents: Secondary | ICD-10-CM

## 2014-09-14 DIAGNOSIS — G5603 Carpal tunnel syndrome, bilateral upper limbs: Secondary | ICD-10-CM

## 2014-09-14 DIAGNOSIS — G5600 Carpal tunnel syndrome, unspecified upper limb: Secondary | ICD-10-CM

## 2014-09-14 HISTORY — DX: Carpal tunnel syndrome, unspecified upper limb: G56.00

## 2014-09-14 NOTE — Procedures (Signed)
     HISTORY:  Peggy Conner is an 80 year old patient with a history of numbness in the hands, prior history of right carpal tunnel syndrome surgery. The patient has some achy sensations in the elbows and shoulders as well. She is being evaluated for possible carpal tunnel syndrome versus a cervical radiculopathy.  NERVE CONDUCTION STUDIES:  Nerve conduction studies were performed on both upper extremities. The distal motor latencies for the median nerves were prolonged bilaterally, with a normal motor amplitude on the right, and a low motor amplitude on the left. The distal motor latencies and motor amplitudes for the ulnar nerves were normal bilaterally. The F wave latencies and nerve conduction velocities for the median and ulnar nerves were normal bilaterally. The sensory latencies for the median nerves were prolonged bilaterally, normal for the ulnar nerves bilaterally.  Nerve conduction studies were performed on the right lower extremity. The distal motor latencies and motor amplitudes for the peroneal and posterior tibial nerves were within normal limits. The nerve conduction velocities for these nerves were also normal. The sensory latency for the peroneal nerve was within normal limits.   EMG STUDIES:  EMG study was performed on the right upper extremity:  The first dorsal interosseous muscle reveals 2 to 4 K units with full recruitment. No fibrillations or positive waves were noted. The abductor pollicis brevis muscle reveals 2 to 5 K units with slightly decreased recruitment. No fibrillations or positive waves were noted. The extensor indicis proprius muscle reveals 1 to 3 K units with full recruitment. No fibrillations or positive waves were noted. The pronator teres muscle reveals 2 to 3 K units with full recruitment. No fibrillations or positive waves were noted. The biceps muscle reveals 1 to 2 K units with full recruitment. No fibrillations or positive waves were noted. The triceps  muscle reveals 2 to 4 K units with full recruitment. No fibrillations or positive waves were noted. Occasional complex repetitive discharges were seen. The anterior deltoid muscle reveals 2 to 3 K units with full recruitment. No fibrillations or positive waves were noted. The cervical paraspinal muscles were tested at 2 levels. No abnormalities of insertional activity were seen at either level tested. There was good relaxation.   IMPRESSION:  Nerve conduction studies done on both upper extremities and on the right lower extremity shows evidence of bilateral carpal tunnel syndrome of mild severity on the right, moderate severity on the left. There is no clear evidence of an overlying peripheral neuropathy. EMG evaluation of the right upper extremity shows findings consistent with carpal tunnel syndrome, without evidence of an overlying cervical radiculopathy.  Jill Alexanders MD 09/14/2014 10:57 AM  [facility] [address] [place], [place] [address]  Phone [phone] Fax [phone]

## 2014-09-14 NOTE — Progress Notes (Signed)
Peggy Conner is an 80 year old patient with a history of numbness in the hands. The patient comes in today for EMG and nerve conduction study evaluation. She also reports some sensory alteration in the feet following chemotherapy for cancer in the past.   Nerve conduction studies of both arms and the right leg show evidence of bilateral carpal tunnel syndrome, no clear evidence of a peripheral neuropathy. EMG of the right arm was unrevealing for a cervical radiculopathy.  The patient was given a prescription for wrist splints, but she does not wish to consider this treatment option. In the past, she has been seen by Dr. Daylene Katayama as she has had carpal tunnel syndrome previously on the right side.  The patient will followup otherwise in about 4 months for evaluation of the memory issues, she will have MRI evaluation the brain in the near future. Blood work done was unremarkable.

## 2014-09-20 ENCOUNTER — Telehealth: Payer: Self-pay | Admitting: Neurology

## 2014-09-20 ENCOUNTER — Ambulatory Visit
Admission: RE | Admit: 2014-09-20 | Discharge: 2014-09-20 | Disposition: A | Discharge status: Home / Self Care | Payer: Medicare Other | Source: Ambulatory Visit | Attending: Neurology | Admitting: Neurology

## 2014-09-20 DIAGNOSIS — R269 Unspecified abnormalities of gait and mobility: Secondary | ICD-10-CM

## 2014-09-20 DIAGNOSIS — E538 Deficiency of other specified B group vitamins: Secondary | ICD-10-CM

## 2014-09-20 DIAGNOSIS — R413 Other amnesia: Secondary | ICD-10-CM

## 2014-09-20 DIAGNOSIS — G622 Polyneuropathy due to other toxic agents: Secondary | ICD-10-CM

## 2014-09-20 NOTE — Telephone Encounter (Signed)
I called patient. MRI the brain shows fairly extensive white matter changes that have progressed since 2012. This likely is a contributing factor in her memory and balance issues. The patient will be seen back in office in about 4 months.   MRI brain 09/20/2014:  Impression   Abnormal MRI brain (without) demonstrating: 1. Mild diffuse, moderate perisylvian and severe mesial temporal atrophy. 2. Moderate-severe periventricular and subcortical and pontine chronic  small vessel ischemic disease.  3. No acute findings. Mild progression of atrophy and chronic small vessel  ischemic disease since 01/28/11.

## 2014-09-27 NOTE — Telephone Encounter (Signed)
Spoke to patient and scheduled follow up for 11-08-14 at noon.  She did not want to wait 4 months for her follow up.

## 2014-10-24 ENCOUNTER — Encounter: Payer: Self-pay | Admitting: Occupational Therapy

## 2014-10-24 ENCOUNTER — Ambulatory Visit: Payer: Medicare Other | Admitting: Occupational Therapy

## 2014-10-24 ENCOUNTER — Ambulatory Visit: Payer: Medicare Other | Attending: Internal Medicine | Admitting: Physical Therapy

## 2014-10-24 DIAGNOSIS — R269 Unspecified abnormalities of gait and mobility: Secondary | ICD-10-CM

## 2014-10-24 DIAGNOSIS — M25519 Pain in unspecified shoulder: Secondary | ICD-10-CM

## 2014-10-24 DIAGNOSIS — R278 Other lack of coordination: Secondary | ICD-10-CM

## 2014-10-24 DIAGNOSIS — M6281 Muscle weakness (generalized): Secondary | ICD-10-CM

## 2014-10-24 DIAGNOSIS — R5381 Other malaise: Secondary | ICD-10-CM

## 2014-10-24 DIAGNOSIS — R279 Unspecified lack of coordination: Secondary | ICD-10-CM

## 2014-10-24 DIAGNOSIS — Z5189 Encounter for other specified aftercare: Secondary | ICD-10-CM | POA: Insufficient documentation

## 2014-10-24 NOTE — Therapy (Signed)
Occupational Therapy Evaluation  Patient Details  Name: Peggy Conner MRN: [id number] Date of Birth: [birth year]  Encounter Date: 10/24/2014      OT End of Session - 10/24/14 1446    Visit Number 1   Number of Visits 16   Date for OT Re-Evaluation 12/19/14   OT Start Time 1400   OT Stop Time 1446   OT Time Calculation (min) 46 min   Activity Tolerance Patient tolerated treatment well      Past Medical History  Diagnosis Date  . Hypertension   . Depression   . Ovarian cancer     s/p surgery and chemothearpy  . Peripheral neuropathy   . Arthritis of foot   . Anxiety   . Memory difficulty 09/06/2014  . Gait disorder 09/06/2014  . HOH (hard of hearing)     hearing aid, history of tinnitus  . Carpal tunnel syndrome 09/14/2014    Bilateral    Past Surgical History  Procedure Laterality Date  . Hip pinning  2009  . Ovarian cancer debulking   2007    There were no vitals taken for this visit.  Visit Diagnosis:  Generalized muscle weakness  Decreased coordination  Pain in joint, shoulder region, unspecified laterality  Debility      Subjective Assessment - 10/24/14 1410    Symptoms weakness in arms, pain in B shoulders, balance problems, general debility   Pertinent History see epic snapshot   Currently in Pain? Yes   Pain Score 7    Pain Location Shoulder   Pain Orientation Left   Pain Descriptors / Indicators Sharp;Constant   Pain Type Chronic pain   Pain Onset Other (comment)  18  months   Pain Frequency Constant   Aggravating Factors  certain movements   Pain Relieving Factors pain meds,    Multiple Pain Sites Yes   Pain Score 6   Pain Type Chronic pain   Pain Location Shoulder   Pain Orientation Right   Pain Descriptors / Indicators Sharp   Pain Frequency Constant          [facility] OT Assessment - 10/24/14 1416    Assessment   Onset Date 07/20/14   Balance Screen   Has the patient fallen in the past 6 months Yes   How many times? --  see PT eval    ADL   Eating/Feeding Minimal assistance   Eating/Feeding Patient Percentage --  for cutting   Grooming Modified independent   Upper Body Bathing Supervision/safety   Lower Body Bathing Supervision/safety   Upper Body Dressing Minimal assistance  buttoning and zipping   Lower Body Dressing Minimal assistance  buttoning and zipping   Toileting - Clothing Manipulation Modified independent   Toileting -  Hygiene Modified Independent   Tub/Shower Transfer Supervision/safety  pt has aide who comes 2x per week to supervise with shower   Tub/Shower Transfer Equipment Shower seat with back   IADL   Shopping Completely unable to shop  due to balance issues and decreased energy   Light Housekeeping Needs help with all home maintenance tasks   Meal Prep Prepares adequate meal if supplied with ingredients  pt does not like to cook and goes out when she can.    Medication Management Is responsible for taking medication in correct dosages at correct time  pt states "I think I am having some memory problems"   Financial Management Requires assistance  dtr helps as needed - dtr is local   Mobility  Mobility Status History of falls   Mobility Status Comments uses straight cane in community   Written Expression   Dominant Hand Right   Activity Tolerance   Activity Tolerance Tolerates 30 min activity with muliple rests   Activity Tolerance Comments pt states she feels like she has no energy and feels tired all the time.   Cognition   Attention --  TBA via functional activity   Memory Appears intact  TBA via functional activity   Sensation   Light Touch Appears Intact   Hot/Cold Appears Intact   Coordination   Fine Motor Movements are Fluid and Coordinated No   Coordination R = 56.47  L= 35.40   AROM   Overall AROM  Within functional limits for tasks performed  pain with shoulder with abduction, L greater than R   Strength   Overall Strength Deficits  3+/5 L proximal strength, 4/5 R  proximal strength   Hand Function   Comments R grip strength = 20 pounds  L grip= 35 pounds.  Patient with arthritis in both hands, right greater than left.              OT Short Term Goals - 10/24/14 1458    OT SHORT TERM GOAL #1   Title Pt will demonstrate increased hand strength combined by 5 pounds to assist in functional activities - 11/21/2014   Status New   OT SHORT TERM GOAL #2   Title Pt will demonstrate improved coordination by decreasing 9 hole peg test time combined by 5 seconds to assist with functional activity   Status New   OT SHORT TERM GOAL #3   Title Pt will be I with HEP   Status New   OT SHORT TERM GOAL #4   Title Pt will rate pain in shoulders no greater than 5/10 with abduction for functional tasks   Status New          OT Long Term Goals - 10/24/14 1500    OT LONG TERM GOAL #1   Title Pt will be I with upgraded HEP prn - 12/19/2014   Status New   OT LONG TERM GOAL #2   Title Pt will demonstrate improved grip strength combined by 8 pounds to assist with functional actvities.   Status New   OT LONG TERM GOAL #3   Title Pt will demonstrate improved coordination by decreasing 9 hole peg test combined by 10 seconds to assist with functional activities.   Status New   OT LONG TERM GOAL #4   Title Pt will be I with buttoning, zipping and cutting with AE prn   Status New   OT LONG TERM GOAL #5   Title Pt will rate shoulder pain no greater than 4/10 with activity   Status New   Long Term Additional Goals   Additional Long Term Goals Yes   OT LONG TERM GOAL #6   Title Pt will be able to participate in ambulatory activity for at least 15 minutes without a rest break.   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  Patient was referred  with diagnosis of impaired balance and weakness of UE's and presents with the following deficts:  impaired coordinaton, impaired UE  strength, pain in both shoulders, impaired balance, dereased activity tolerance.  Deficits impact patient's ability to complete ADL's, complete IADL's, and participate in [place].     Rehab Potential Good   Clinical Impairments Affecting Rehab Potential Pt will benefit from skilled OT to address the following:  decreased strength BUE's, pain in both shoulders, decreased grip strength, decreased coordination, decreased balance, decreased functional mobility   OT Frequency 2x / week   OT Duration 8 weeks   OT Treatment/Interventions Self-care/ADL training;Moist Heat;Fluidtherapy;Therapeutic exercise;Energy conservation;Manual Therapy;Functional Mobility Training;DME and/or AE instruction;Therapeutic activities;Therapeutic exercises;Patient/family education;Balance training   Plan start HEP for coordination;  assess ability to use theraputty due to arthritis          G-Codes - 2014/10/25 1506    Functional Assessment Tool Used 9 hole peg test, grip strength, clinical observation   Functional Limitation Carrying, moving and handling objects   Carrying, Moving and Handling Objects Current Status (U2353) At least 60 percent but less than 80 percent impaired, limited or restricted   Carrying, Moving and Handling Objects Goal Status (I1443) At least 40 percent but less than 60 percent impaired, limited or restricted      Problem List Patient Active Problem List   Diagnosis Date Noted  . Carpal tunnel syndrome 09/14/2014  . Memory difficulty 09/06/2014  . Gait disorder 09/06/2014  . 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 Ovarian cancer 12/15/2011                                             Forde Radon, MS, OTR/L Oct 25, 2014 3:15 PM Phone: [phone] Fax: [phone]   [name] 10/25/14, 3:14  PM

## 2014-10-24 NOTE — Therapy (Signed)
Physical Therapy Evaluation  Patient Details  Name: Peggy Conner MRN: [id number] Date of Birth: May 12, 1925  Encounter Date: 10/24/2014      PT End of Session - 10/24/14 1420    Visit Number 1  G1   Number of Visits 17   Date for PT Re-Evaluation 11/23/14   PT Start Time 6213   PT Stop Time 1405   PT Time Calculation (min) 47 min      Past Medical History  Diagnosis Date  . Hypertension   . Depression   . Ovarian cancer     s/p surgery and chemothearpy  . Peripheral neuropathy   . Arthritis of foot   . Anxiety   . Memory difficulty 09/06/2014  . Gait disorder 09/06/2014  . HOH (hard of hearing)     hearing aid, history of tinnitus  . Carpal tunnel syndrome 09/14/2014    Bilateral    Past Surgical History  Procedure Laterality Date  . Hip pinning  2009  . Ovarian cancer debulking   2007    There were no vitals taken for this visit.  Visit Diagnosis:  Abnormality of gait      Subjective Assessment - 10/24/14 1413    Patient Stated Goals Improve balance and gait; pt. would like to play golf again;  increase UE strength          [facility] PT Assessment - 10/24/14 1338    Precautions   Precautions None   Balance Screen   Has the patient fallen in the past 6 months Yes   How many times? 3   Has the patient had a decrease in activity level because of a fear of falling?  Yes   Is the patient reluctant to leave their home because of a fear of falling?  Yes   Onset Private residence   [place] to enter  1 step   [place] One level   Prior Function   Level of Independence Independent with homemaking with ambulation   Ambulation/Gait   Ambulation/Gait Yes   Ambulation/Gait Assistance 5: Supervision   Ambulation/Gait Assistance Details right foot/LE is externally rotated   Ambulation Distance (Feet) 150 Feet   Assistive device Straight cane  no device used during testing   Gait velocity 2.   32.8/12.2=    Standardized Balance Assessment   Standardized Balance Assessment Berg Balance Test  score = 41/56   Berg Balance Test   Sit to Stand Able to stand without using hands and stabilize independently   Standing Unsupported Able to stand safely 2 minutes   Sitting with Back Unsupported but Feet Supported on Floor or Stool Able to sit safely and securely 2 minutes   Stand to Sit Sits safely with minimal use of hands   Transfers Able to transfer safely, minor use of hands   Standing Unsupported with Eyes Closed Able to stand 10 seconds with supervision   Standing Ubsupported with Feet Together Able to place feet together independently and stand for 1 minute with supervision   From Standing, Reach Forward with Outstretched Arm Can reach confidently >25 cm (10")  10"   From Standing Position, Pick up Object from Floor Able to pick up shoe, needs supervision   From Standing Position, Turn to Look Behind Over each Shoulder Looks behind one side only/other side shows less weight shift  greater rotation to left side   Turn 360 Degrees Needs close supervision or  verbal cueing  6.12 secs to L: 7.4 to right side   Standing Unsupported, Alternately Place Feet on Step/Stool Able to complete >2 steps/needs minimal assist   Standing Unsupported, One Foot in Front Able to take small step independently and hold 30 seconds   Standing on One Leg Tries to lift leg/unable to hold 3 seconds but remains standing independently   Timed Up and Go Test   Normal TUG (seconds) 15.7              PT Short Term Goals - 10/25/14 1425    PT SHORT TERM GOAL #1   Title Incr. TUG score to </= 13.5 secs without cane for reduced fall risk   Baseline 11-23-14   Time 4   Period Weeks   Status On-going   PT SHORT TERM GOAL #2   Title Increase gait velocity to >/= 2.9 ft/sec without device for incr. gait efficiency   Baseline 11-23-14   Time 4   Period Weeks   Status On-going   PT SHORT TERM GOAL #3    Title Increase Berg score to >/= 45/56 to decr. fall risk.   Baseline 11-23-14   Time 4   Period Weeks   Status On-going   PT SHORT TERM GOAL #4   Title Independent in HEP for balance and ankle strengthening exercises.   Baseline 11-23-14   Time 4   Period Weeks   Status On-going          PT Long Term Goals - 2014-10-25 1430    PT LONG TERM GOAL #1   Title Increase Berg score to >/=49/56 to decr. fall risk.   Baseline 11-23-14   Time 4   Period Weeks   Status On-going   PT LONG TERM GOAL #2   Title Incr. TUG score to </= 12.5 secs without device for reduced fall risk   Baseline 11-23-14   Time 4   Period Weeks   Status On-going   PT LONG TERM GOAL #3   Title Incr. gait velocity to >/= 3.2 ft/sec without device for incr. gait efficiency   Baseline 11-23-14   Time 4   Period Weeks   Status On-going   PT LONG TERM GOAL #4   Title Report at least 30% improvement in balance and steadiness with functional mobility and ambulation.   Baseline 11-23-14   Time 4   Period Weeks   Status On-going          Plan - Oct 25, 2014 1422    Clinical Impression Statement pt.'s neuropathy is contributing to balance deficits; pt. has decreased high level balance skills   Pt will benefit from skilled therapeutic intervention in order to improve on the following deficits Abnormal gait;Impaired sensation;Decreased endurance;Decreased balance   Rehab Potential Good   PT Frequency 2x / week   PT Duration 8 weeks   PT Treatment/Interventions Therapeutic activities;Patient/family education;Therapeutic exercise;Gait training;Balance training;Neuromuscular re-education;Stair training   PT Next Visit Plan begin balance HEP   PT Home Exercise Plan balance exercises and ankle strengthening exercises   Consulted and Agree with Plan of Care Patient          G-Codes - 2014/10/25 1435    Functional Assessment Tool Used TUG: Berg balance test   Functional Limitation Mobility: Walking and moving around    Mobility: Walking and Moving Around Current Status [phone]) At least 20 percent but less than 40 percent impaired, limited or restricted   Mobility: Walking and Moving Around Goal Status [phone])  At least 1 percent but less than 20 percent impaired, limited or restricted      Problem List Patient Active Problem List   Diagnosis Date Noted  . Carpal tunnel syndrome 09/14/2014  . Memory difficulty 09/06/2014  . Gait disorder 09/06/2014  . 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 Ovarian cancer 12/15/2011                                           Guido Sander, [place] [address] [place], [place] [address] [phone]    Alda Lea 10/24/2014, 2:47 PM

## 2014-11-05 ENCOUNTER — Other Ambulatory Visit: Payer: Self-pay | Admitting: Neurology

## 2014-11-07 ENCOUNTER — Encounter: Payer: Self-pay | Admitting: Occupational Therapy

## 2014-11-07 ENCOUNTER — Ambulatory Visit: Payer: Medicare Other | Admitting: Occupational Therapy

## 2014-11-07 ENCOUNTER — Ambulatory Visit: Payer: Medicare Other | Attending: Internal Medicine | Admitting: Physical Therapy

## 2014-11-07 ENCOUNTER — Encounter: Payer: Self-pay | Admitting: Physical Therapy

## 2014-11-07 DIAGNOSIS — R5381 Other malaise: Secondary | ICD-10-CM

## 2014-11-07 DIAGNOSIS — R279 Unspecified lack of coordination: Secondary | ICD-10-CM

## 2014-11-07 DIAGNOSIS — R269 Unspecified abnormalities of gait and mobility: Secondary | ICD-10-CM | POA: Insufficient documentation

## 2014-11-07 DIAGNOSIS — M6281 Muscle weakness (generalized): Secondary | ICD-10-CM

## 2014-11-07 DIAGNOSIS — R278 Other lack of coordination: Secondary | ICD-10-CM

## 2014-11-07 DIAGNOSIS — Z5189 Encounter for other specified aftercare: Secondary | ICD-10-CM | POA: Insufficient documentation

## 2014-11-07 DIAGNOSIS — M25519 Pain in unspecified shoulder: Secondary | ICD-10-CM

## 2014-11-07 NOTE — Therapy (Signed)
[facility] [address] [place], [place], [address] Phone: [phone]   Fax:  [phone]  Physical Therapy Treatment  Patient Details  Name: Phoenicia Pirie MRN: [id number] Date of Birth: 12/02/1924  Encounter Date: 11/07/2014      PT End of Session - 11/07/14 1631    Visit Number 2   Number of Visits 17   Date for PT Re-Evaluation 11/23/14   PT Start Time 1532   PT Stop Time 1618   PT Time Calculation (min) 46 min      Past Medical History  Diagnosis Date  . Hypertension   . Depression   . Ovarian cancer     s/p surgery and chemothearpy  . Peripheral neuropathy   . Arthritis of foot   . Anxiety   . Memory difficulty 09/06/2014  . Gait disorder 09/06/2014  . HOH (hard of hearing)     hearing aid, history of tinnitus  . Carpal tunnel syndrome 09/14/2014    Bilateral    Past Surgical History  Procedure Laterality Date  . Hip pinning  2009  . Ovarian cancer debulking   2007    There were no vitals taken for this visit.  Visit Diagnosis:  Generalized muscle weakness  Debility  Abnormality of gait      Subjective Assessment - 11/07/14 1621    Symptoms Pt denies falls.  Asks same questions multiple times during session.   Currently in Pain? No/denies   Multiple Pain Sites No           [facility] Adult PT Treatment/Exercise - 11/07/14 1622    Ambulation/Gait   Ambulation/Gait Yes   Ambulation/Gait Assistance 5: Supervision   Ambulation/Gait Assistance Details RLE rotated   Ambulation Distance (Feet) 150 Feet  twice   Assistive device Straight cane   Knee/Hip Exercises: Aerobic   Stationary Bike Scifit leve 2.0 x 8 minutes 4 extremities   Knee/Hip Exercises: Standing   Heel Raises 1 set;20 reps;5 seconds;Other (comment)  holding counter   Knee Flexion Both;1 set;10 reps;AROM;Other (comment)  holding counter   SLS 10 seconds x 3 each side   Other Standing Knee Exercises tandem standing x 3 each side x 10  seconds   Other Standing Knee Exercises bil hip extension/flexion straight leg x 10;bil hip abd x 10   Balance Exercises   Sidestepping Other reps (comment)  8'x6 at counter          PT Education - 11/07/14 1631    Education provided Yes   Education Details HEP   Person(s) Educated Patient   Methods Explanation;Demonstration;Handout   Comprehension Verbalized understanding;Need further instruction           Plan - 11/07/14 1632    Clinical Impression Statement Pt needs multiple cues with exercises.   Pt will benefit from skilled therapeutic intervention in order to improve on the following deficits Abnormal gait;Impaired sensation;Decreased endurance;Decreased balance   Rehab Potential Good   PT Frequency 2x / week   PT Duration 8 weeks   PT Treatment/Interventions Therapeutic activities;Patient/family education;Therapeutic exercise;Gait training;Balance training;Neuromuscular re-education;Stair training   PT Next Visit Plan Review HEP.  Balance activities   Consulted and Agree with Plan of Care Patient       Problem List Patient Active Problem List   Diagnosis Date Noted  . Carpal tunnel syndrome 09/14/2014  . Memory difficulty 09/06/2014  . Gait disorder 09/06/2014  . 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 Ovarian cancer 12/15/2011    Narda Bonds  11/07/2014, 4:35 PM     Narda Bonds, PTA [place] 11/07/2014 4:35 PM Phone: [phone] Fax: [phone]

## 2014-11-07 NOTE — Patient Instructions (Signed)
Theraputty Exercises to do at home - Yellow Putty  1. Make a ball 2. Make a pancake 3. Make a cone Repeat 3 times, alternating hands  4.  Make a fat hot dog and squeeze.  Do each hand 2 times  5.  Pull taffy - keep the putty thick and pull with your fingers. Do five times 6.  Pinch.  Make a hot dog.  Pinch along the hot dog with your index finger, ring finger and thumb (all at once).  Do each hand 2 times.   Stop if you get pain in your hands. Let your therapist know.  It is ok to take a break as you work.

## 2014-11-07 NOTE — Therapy (Signed)
[facility] [address] [place], [place], [address] Phone: [phone]   Fax:  [phone]  Occupational Therapy Treatment  Patient Details  Name: Peggy Conner MRN: [id number] Date of Birth: 23-Dec-1924  Encounter Date: 11/07/2014      OT End of Session - 11/07/14 1533    Visit Number 2   Number of Visits 16   Date for OT Re-Evaluation 12/19/14   OT Start Time 1446   OT Stop Time 1529   OT Time Calculation (min) 43 min   Activity Tolerance Patient tolerated treatment well      Past Medical History  Diagnosis Date  . Hypertension   . Depression   . Ovarian cancer     s/p surgery and chemothearpy  . Peripheral neuropathy   . Arthritis of foot   . Anxiety   . Memory difficulty 09/06/2014  . Gait disorder 09/06/2014  . HOH (hard of hearing)     hearing aid, history of tinnitus  . Carpal tunnel syndrome 09/14/2014    Bilateral    Past Surgical History  Procedure Laterality Date  . Hip pinning  2009  . Ovarian cancer debulking   2007    There were no vitals taken for this visit.  Visit Diagnosis:  Generalized muscle weakness  Decreased coordination  Pain in joint, shoulder region, unspecified laterality      Subjective Assessment - 11/07/14 1454    Symptoms (p) 'Now what are the goals?"   Pertinent History (p) see epic snapshot   Currently in Pain? (p) Yes   Pain Score (p) 5    Pain Location (p) Shoulder   Pain Orientation (p) Left   Pain Descriptors / Indicators (p) Aching   Pain Type (p) Chronic pain   Pain Onset (p) Other (comment)   Pain Frequency (p) Intermittent   Aggravating Factors  (p) certain movements   Pain Relieving Factors (p) pain meds   Multiple Pain Sites (p) Yes   Pain Score (p) 4   Pain Location (p) Shoulder   Pain Orientation (p) Right   Pain Descriptors / Indicators (p) Aching   Pain Frequency (p) Constant            OT Treatments/Exercises (OP) - 11/07/14 0001    Exercises   Exercises Hand   Hand Exercises   Theraputty Flatten;Roll;Grip;Pinch;Locate Pegs  yellow putty;15 pegs. Rated 9/10 for fatigue in hands at end          OT Education - 11/07/14 1533    Education provided Yes   Education Details theraputty exercise HEP with yellow   Person(s) Educated Patient   Methods Explanation;Demonstration;Handout   Comprehension Verbalized understanding;Returned demonstration          OT Short Term Goals - 11/07/14 1537    OT SHORT TERM GOAL #1   Title Pt will demonstrate increased hand strength combined by 5 pounds to assist in functional activities - 11/21/2014   Status On-going   OT SHORT TERM GOAL #2   Title Pt will demonstrate improved coordination by decreasing 9 hole peg test time combined by 5 seconds to assist with functional activity   Status On-going   OT SHORT TERM GOAL #3   Title Pt will be I with HEP   Status On-going   OT SHORT TERM GOAL #4   Title Pt will rate pain in shoulders no greater than 5/10 with abduction for functional tasks   Status On-going  OT Long Term Goals - 11/07/14 1537    OT LONG TERM GOAL #1   Title Pt will be I with upgraded HEP prn - 12/19/2014   Status On-going   OT LONG TERM GOAL #2   Title Pt will demonstrate improved grip strength combined by 8 pounds to assist with functional actvities.   Status On-going   OT LONG TERM GOAL #3   Title Pt will demonstrate improved coordination by decreasing 9 hole peg test combined by 10 seconds to assist with functional activities.   Status On-going   OT LONG TERM GOAL #4   Title Pt will be I with buttoning, zipping and cutting with AE prn   Status On-going   OT LONG TERM GOAL #5   Title Pt will rate shoulder pain no greater than 4/10 with activity   Status On-going   OT LONG TERM GOAL #6   Title Pt will be able to participate in ambulatory activity for at least 15 minutes without a rest break.   Status New          Plan - 11/07/14 1533    Clinical  Impression Statement Pt stated "now did I meet you last time?"  Completed theraputty exercises with handout with pictures, typed instructions, and demon/return demonstration. Pt clearly has memory deficits and lives alone therefore unsure of carry over.  Pt agreeable to HEP.    Rehab Potential Good   Clinical Impairments Affecting Rehab Potential Pt will benefit from skilled OT to address the following:  decreased strength BUE's, pain in both shoulders, decreased grip strength, decreased coordination, decreased balance, decreased functional mobility   OT Frequency 2x / week   OT Duration 8 weeks   OT Treatment/Interventions Self-care/ADL training;Moist Heat;Fluidtherapy;Therapeutic exercise;Energy conservation;Manual Therapy;Functional Mobility Training;DME and/or AE instruction;Therapeutic activities;Therapeutic exercises;Patient/family education;Balance training   Plan check HEP, address pain in shoulders.                               Problem List Patient Active Problem List   Diagnosis Date Noted  . Carpal tunnel syndrome 09/14/2014  . Memory difficulty 09/06/2014  . Gait disorder 09/06/2014  . 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 Ovarian cancer 12/15/2011   Forde Radon, MS, OTR/L 11/07/2014 3:39 PM Phone: [phone] Fax: [phone]    [name] 11/07/2014, 3:39 PM

## 2014-11-07 NOTE — Patient Instructions (Signed)
Heel Raise (Calf Strength / Balance)   Rise up on tiptoes.  Hold position to count of 5. Return slowly. Repeat 20 times per session. Do 1 session per day. Variation: Do without weights.  Copyright  VHI. All rights reserved.  ANKLE: Dorsiflexion - Standing   Stand with upright posture. Raise toes of both feet up at same time. 20 reps per set, 1 set per day.  Hold onto a support.  Copyright  VHI. All rights reserved.  Side-Stepping   Walk to left side with eyes open. Take even steps, leading with same foot. Make sure each foot lifts off the floor. Repeat in opposite direction. Repeat for 8 feet times 6. Do 1 session per day.   Copyright  VHI. All rights reserved.  EXTENSION: Standing (Active)  http://gtsc.exer.us/77   Copyright  VHI. All rights reserved.  Flexion and Extension - Standing   Stand and support self while swinging leg and hip forward and backward 10 times. Repeat with other. Do 1 time per day.  Copyright  VHI. All rights reserved.  FLEXION: Standing - Stable (Active)   Stand, both feet flat. Lift right knee toward ceiling. Complete 10 repetitions. Perform 1 session per day.  http://gtsc.exer.us/29   Copyright  VHI. All rights reserved.  ABDUCTION: Standing (Active)   Stand, feet flat. Lift right leg out to side.  Complete 10 repetitions. Perform 1 session per day.  http://gtsc.exer.us/111   Copyright  VHI. All rights reserved.  SINGLE LIMB STANCE   Stance: single leg on floor. Raise leg. Hold 10 seconds. Repeat with other leg. 3 reps per set, 1 set per day.  Copyright  VHI. All rights reserved.  Tandem Stance   Right foot in front of left, heel touching toe both feet "straight ahead". Stand on Foot Triangle of Support with both feet. Balance in this position 20 seconds. Do with left foot in front of right.  Repeat 3 times. 1 time per day.  Copyright  VHI. All rights reserved.

## 2014-11-08 ENCOUNTER — Encounter: Payer: Self-pay | Admitting: Neurology

## 2014-11-08 ENCOUNTER — Ambulatory Visit (INDEPENDENT_AMBULATORY_CARE_PROVIDER_SITE_OTHER): Payer: Medicare Other | Admitting: Neurology

## 2014-11-08 VITALS — BP 104/72 | HR 114 | Ht 64.0 in | Wt 149.8 lb

## 2014-11-08 DIAGNOSIS — R413 Other amnesia: Secondary | ICD-10-CM

## 2014-11-08 DIAGNOSIS — G622 Polyneuropathy due to other toxic agents: Secondary | ICD-10-CM

## 2014-11-08 DIAGNOSIS — R269 Unspecified abnormalities of gait and mobility: Secondary | ICD-10-CM

## 2014-11-08 NOTE — Patient Instructions (Signed)

## 2014-11-08 NOTE — Progress Notes (Signed)
Reason for visit: Memory disturbance  Peggy Conner is an 80 y.o. female  History of present illness:  Peggy Conner is an 80 year old right-handed white female with a history of a memory disturbance. The patient lives alone, but the family helps out with the finances, and managing medications. The patient has symptoms of a peripheral neuropathy, but EMG and nerve conduction studies do not show a clear evidence of a neuropathy issue. The patient could have a small fiber neuropathy, or a sensory ganglion neuropathy associated with her prior chemotherapy. The patient is on gabapentin which she claims is helpful. The patient was placed on Cymbalta for the neuropathy pain as well as for depression following the death of her husband. The patient took the medication for least a month, but apparently stopped the drug for unknown reasons. The patient does not recall that she had any problems with tolerance. The patient returns to this office for further evaluation.  Past Medical History  Diagnosis Date  . Hypertension   . Depression   . Ovarian cancer     s/p surgery and chemothearpy  . Peripheral neuropathy   . Arthritis of foot   . Anxiety   . Memory difficulty 09/06/2014  . Gait disorder 09/06/2014  . HOH (hard of hearing)     hearing aid, history of tinnitus  . Carpal tunnel syndrome 09/14/2014    Bilateral    Past Surgical History  Procedure Laterality Date  . Hip pinning  2009  . Ovarian cancer debulking   2007    Family History  Problem Relation Age of Onset  . Lung cancer Brother   . Heart attack Father   . Cancer Brother   . Lung cancer Brother     Social history:  reports that she has never smoked. She has never used smokeless tobacco. She reports that she drinks alcohol. She reports that she does not use illicit drugs.   No Known Allergies  Medications:  Current Outpatient Prescriptions on File Prior to Visit  Medication Sig Dispense Refill  . aspirin 81 MG tablet Take 81 mg  by mouth daily.    . clonazePAM (KLONOPIN) 0.5 MG tablet Take 0.5 mg by mouth daily.    . diclofenac sodium (VOLTAREN) 1 % GEL Apply 1 application topically daily.    . DULoxetine (CYMBALTA) 30 MG capsule TAKE ONE CAPSULE BY MOUTH EVERY DAY 30 capsule 0  . gabapentin (NEURONTIN) 300 MG capsule Take 300 mg by mouth at bedtime.    . Glucosamine-Chondroitin (GLUCOSAMINE CHONDR COMPLEX PO) Take by mouth daily.     Marland Kitchen losartan (COZAAR) 100 MG tablet Take 50 mg by mouth daily. 1/2 tab daily    . Melatonin 5 MG TABS Take by mouth daily.    . Omega-3 Fatty Acids (FISH OIL) 1200 MG CAPS Take 1 capsule by mouth daily.    . ranitidine (ZANTAC) 150 MG capsule Take 150 mg by mouth as needed.      No current facility-administered medications on file prior to visit.    ROS:  Out of a complete 14 system review of symptoms, the patient complains only of the following symptoms, and all other reviewed systems are negative.  Difficulty urinating Mild balance problems Memory disturbance  Blood pressure 104/72, pulse 114, height 5\' 4"  (1.626 m), weight 149 lb 12.8 oz (67.949 kg).  Physical Exam  General: The patient is alert and cooperative at the time of the examination.  Skin: No significant peripheral edema is noted.  Neurologic Exam  Mental status: The Mini-Mental Status Examination done today shows a total score 27/30.  Cranial nerves: Facial symmetry is present. Speech is normal, no aphasia or dysarthria is noted. Extraocular movements are full. Visual fields are full.  Motor: The patient has good strength in all 4 extremities.  Sensory examination: Soft touch sensation is symmetric on the face, arms, and legs.  Coordination: The patient has good finger-nose-finger and heel-to-shin bilaterally.  Gait and station: The patient has a normal gait. The patient has a cane for ambulation. Tandem gait is unsteady. Romberg is negative. No drift is seen.  Reflexes: Deep tendon reflexes are  symmetric.   09/20/14 MRI brain:  IMPRESSION:  Abnormal MRI brain (without) demonstrating: 1. Mild diffuse, moderate perisylvian and severe mesial temporal atrophy. 2. Moderate-severe periventricular and subcortical and pontine chronic small vessel ischemic disease.  3. No acute findings. Mild progression of atrophy and chronic small vessel ischemic disease since 01/28/11.   Assessment/Plan:  1. Memory disturbance  2. Gait disturbance  3. Peripheral neuropathy  4. Cerebrovascular disease  MRI of the brain has shown fairly extensive small vessel disease, and the patient likely has some degree of vascular dementia. The patient is being treated for her neuropathy symptoms. The patient will go back on Cymbalta, if she does well for 2 weeks, the family will call me, and I will start Aricept at that point. She will follow-up in 6 weeks for an evaluation. The blood work done previously was unremarkable.  Peggy Alexanders MD 11/08/2014 8:01 PM  [facility] [address] [place], [place] [address]  Phone [phone] Fax [phone]

## 2014-11-09 ENCOUNTER — Ambulatory Visit: Payer: Medicare Other | Admitting: Physical Therapy

## 2014-11-09 ENCOUNTER — Ambulatory Visit: Payer: Medicare Other | Admitting: Occupational Therapy

## 2014-11-13 ENCOUNTER — Ambulatory Visit: Payer: Medicare Other | Admitting: Occupational Therapy

## 2014-11-13 ENCOUNTER — Ambulatory Visit: Payer: Medicare Other | Admitting: Physical Therapy

## 2014-11-15 ENCOUNTER — Encounter: Payer: Self-pay | Admitting: Occupational Therapy

## 2014-11-15 ENCOUNTER — Ambulatory Visit: Payer: Medicare Other | Admitting: Occupational Therapy

## 2014-11-15 ENCOUNTER — Encounter: Payer: Self-pay | Admitting: Physical Therapy

## 2014-11-15 ENCOUNTER — Ambulatory Visit: Payer: Medicare Other | Admitting: Physical Therapy

## 2014-11-15 DIAGNOSIS — R269 Unspecified abnormalities of gait and mobility: Secondary | ICD-10-CM

## 2014-11-15 DIAGNOSIS — R278 Other lack of coordination: Secondary | ICD-10-CM

## 2014-11-15 DIAGNOSIS — R279 Unspecified lack of coordination: Secondary | ICD-10-CM

## 2014-11-15 DIAGNOSIS — M6281 Muscle weakness (generalized): Secondary | ICD-10-CM

## 2014-11-15 DIAGNOSIS — M25519 Pain in unspecified shoulder: Secondary | ICD-10-CM

## 2014-11-15 DIAGNOSIS — Z5189 Encounter for other specified aftercare: Secondary | ICD-10-CM | POA: Diagnosis not present

## 2014-11-15 DIAGNOSIS — R5381 Other malaise: Secondary | ICD-10-CM

## 2014-11-15 NOTE — Patient Instructions (Signed)
  Coordination Activities  Perform the following activities for 15-20  minutes 1-2 times per day with both hand(s).  Do this in addition to your putty exercises!!   Rotate ball in fingertips (clockwise and counter-clockwise).  Toss ball between hands.  Toss ball in air and catch with the same hand.  Flip cards 1 at a time as fast as you can.  Deal cards with your thumb (Hold deck in hand and push card off top with thumb).  Pick up coins, buttons, marbles, dried beans/pasta of different sizes and place in container.  Pick up coins and place in container or coin bank.  Pick up coins and stack.  Practice writing and/or typing.  Screw together nuts and bolts, then unfasten.

## 2014-11-15 NOTE — Therapy (Signed)
[facility] [address] [place], [place], [address] Phone: [phone]   Fax:  [phone]  Occupational Therapy Treatment  Patient Details  Name: Peggy Conner MRN: [id number] Date of Birth: 11-Aug-1925  Encounter Date: 11/15/2014      OT End of Session - 11/15/14 1154    Visit Number 3   Number of Visits 16   Date for OT Re-Evaluation 12/19/14   OT Start Time 1101   OT Stop Time 1143   OT Time Calculation (min) 42 min   Activity Tolerance Patient tolerated treatment well      Past Medical History  Diagnosis Date  . Hypertension   . Depression   . Ovarian cancer     s/p surgery and chemothearpy  . Peripheral neuropathy   . Arthritis of foot   . Anxiety   . Memory difficulty 09/06/2014  . Gait disorder 09/06/2014  . HOH (hard of hearing)     hearing aid, history of tinnitus  . Carpal tunnel syndrome 09/14/2014    Bilateral    Past Surgical History  Procedure Laterality Date  . Hip pinning  2009  . Ovarian cancer debulking   2007    There were no vitals taken for this visit.  Visit Diagnosis:  Generalized muscle weakness  Decreased coordination  Pain in joint, shoulder region, unspecified laterality      Subjective Assessment - 11/15/14 1106    Symptoms "I think I have a bladder infection that is why I missed appoirtments"  Recommend pt call her MD immediatley for follow up. Pt in agreement   Pertinent History see epic snapshot   Currently in Pain? No/denies            OT Treatments/Exercises (OP) - 11/15/14 0001    Exercises   Exercises Hand   Hand Exercises   Fine Motor Coordination In hand manipuation training   Other Hand Exercises Addressed fine motor coordination and sustained motor activity bilaterally - picking  up and stacking coins, flipping cards, rotating ball, tossing ball with two hands then single hand, writing with AE.  Pt did all activities with both hands except writing (used  dominanant R hand for this activity.  Pt was given these activities as part of HEP.          OT Education - 11/15/14 1154    Education provided Yes   Education Details Coordinaton HEP   Person(s) Educated Patient   Methods Explanation;Demonstration;Handout   Comprehension Verbalized understanding;Returned demonstration          OT Short Term Goals - 11/15/14 1156    OT SHORT TERM GOAL #1   Title Pt will demonstrate increased hand strength combined by 5 pounds to assist in functional activities - 11/21/2014   Status On-going   OT SHORT TERM GOAL #2   Title Pt will demonstrate improved coordination by decreasing 9 hole peg test time combined by 5 seconds to assist with functional activity   Status On-going   OT SHORT TERM GOAL #3   Title Pt will be I with HEP   Status On-going   OT SHORT TERM GOAL #4   Title Pt will rate pain in shoulders no greater than 5/10 with abduction for functional tasks   Status On-going          OT Long Term Goals - 11/15/14 1157    OT LONG TERM GOAL #1   Title Pt will be I with upgraded HEP prn - 12/19/2014  Status On-going   OT LONG TERM GOAL #2   Title Pt will demonstrate improved grip strength combined by 8 pounds to assist with functional actvities.   Status On-going   OT LONG TERM GOAL #3   Title Pt will demonstrate improved coordination by decreasing 9 hole peg test combined by 10 seconds to assist with functional activities.   Status On-going   OT LONG TERM GOAL #4   Title Pt will be I with buttoning, zipping and cutting with AE prn   Status On-going   OT LONG TERM GOAL #5   Title Pt will rate shoulder pain no greater than 4/10 with activity   Status On-going   OT LONG TERM GOAL #6   Title Pt will be able to participate in ambulatory activity for at least 15 minutes without a rest break.   Status New          Plan - 11/15/14 1155    Clinical Impression Statement Pt stated she missed last two sessions due to not feeling well  and then due to getting lost in [place].  Reminded pt of importance of keeping appointments as well as consistently doing program  at home in order to meet goals. Pt in agreement.   Rehab Potential Good   Clinical Impairments Affecting Rehab Potential Pt will benefit from skilled OT to address the following:  decreased strength BUE's, pain in both shoulders, decreased grip strength, decreased coordination, decreased balance, decreased functional mobility   OT Frequency 2x / week   OT Duration 8 weeks   OT Treatment/Interventions Self-care/ADL training;Moist Heat;Fluidtherapy;Therapeutic exercise;Energy conservation;Manual Therapy;Functional Mobility Training;DME and/or AE instruction;Therapeutic activities;Therapeutic exercises;Patient/family education;Balance training   Plan check with HEP compliance, address pain/ROM in shoulders                               Problem List Patient Active Problem List   Diagnosis Date Noted  . Carpal tunnel syndrome 09/14/2014  . Memory difficulty 09/06/2014  . Gait disorder 09/06/2014  . 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 Ovarian cancer 12/15/2011   Forde Radon, MS, OTR/L 11/15/2014 11:57 AM Phone: [phone] Fax: [phone]   [name] 11/15/2014, 11:57 AM

## 2014-11-15 NOTE — Therapy (Signed)
[facility] [address] [place], [place], [address] Phone: [phone]   Fax:  [phone]  Physical Therapy Treatment  Patient Details  Name: Peggy Conner MRN: [id number] Date of Birth: [birth year]  Encounter Date: 11/15/2014      PT End of Session - 11/15/14 1255    Visit Number 3   Number of Visits 17   Date for PT Re-Evaluation 11/23/14   PT Start Time 1150   PT Stop Time 1230   PT Time Calculation (min) 40 min   Equipment Utilized During Treatment Gait belt   Activity Tolerance Patient tolerated treatment well   Behavior During Therapy [facility] for tasks assessed/performed      Past Medical History  Diagnosis Date  . Hypertension   . Depression   . Ovarian cancer     s/p surgery and chemothearpy  . Peripheral neuropathy   . Arthritis of foot   . Anxiety   . Memory difficulty 09/06/2014  . Gait disorder 09/06/2014  . HOH (hard of hearing)     hearing aid, history of tinnitus  . Carpal tunnel syndrome 09/14/2014    Bilateral    Past Surgical History  Procedure Laterality Date  . Hip pinning  2009  . Ovarian cancer debulking   2007    There were no vitals taken for this visit.  Visit Diagnosis:  Generalized muscle weakness  Decreased coordination  Debility  Abnormality of gait      Subjective Assessment - 11/15/14 1154    Symptoms Has had a bladder infection for over a week now, done with antibiotics, however still feels like she has one. Went to MD and gave urine sample, they called back as she does still have one. Waiting to hear back from them on what they plan to do now. Has not been doing her HEP due to feeling bad/weak. No falls.                           Currently in Pain? No/denies      Treatment:  There Ex: Had pt perform ex's from HEP issued at last visit. Needed mod cues on correct ex form and posture. Issued new handout with ex's as pt has lost hers. Performed reps as stated in HEP issued on  11/07/2014.       [place] Adult PT Treatment/Exercise - 11/15/14 1220    Ambulation/Gait   Ambulation/Gait Yes   Ambulation/Gait Assistance 4: Min assist;4: Min guard;5: Supervision   Ambulation/Gait Assistance Details increased assist needed with gait without AD, especially with turns due to imbalance and veering at times. Pt needs steady cues to correct gait deviations and on posture with gait.                 Ambulation Distance (Feet) 270 Feet  x1 with out AD, 210 feet with cane   Assistive device None;Straight cane   Gait Pattern Step-through pattern;Decreased stride length;Poor foot clearance - left;Poor foot clearance - right;Narrow base of support;Decreased trunk rotation;Lateral hip instability  veering/increassed instability without AD noted          PT Education - 11/15/14 1223    Education provided Yes   Education Details HEP reveiw, gait with AD for safety for now   Person(s) Educated Patient   Methods Explanation;Demonstration;Handout   Comprehension Verbalized understanding;Need further instruction;Verbal cues required          PT Short Term Goals - 11/15/14 1257  PT SHORT TERM GOAL #1   Title Incr. TUG score to </= 13.5 secs without cane for reduced fall risk   Baseline 11-23-14   Time 4   Period Weeks   Status On-going   PT SHORT TERM GOAL #2   Title Increase gait velocity to >/= 2.9 ft/sec without device for incr. gait efficiency   Baseline 11-23-14   Time 4   Period Weeks   Status On-going   PT SHORT TERM GOAL #3   Title Increase Berg score to >/= 45/56 to decr. fall risk.   Baseline 11-23-14   Time 4   Period Weeks   Status On-going   PT SHORT TERM GOAL #4   Title Independent in HEP for balance and ankle strengthening exercises.   Baseline 11-23-14   Time 4   Period Weeks   Status On-going          PT Long Term Goals - 11/16/14 1257    PT LONG TERM GOAL #1   Title Increase Berg score to >/=49/56 to decr. fall risk.   Baseline 11-23-14    Time 4   Period Weeks   Status On-going   PT LONG TERM GOAL #2   Title Incr. TUG score to </= 12.5 secs without device for reduced fall risk   Baseline 11-23-14   Time 4   Period Weeks   Status On-going   PT LONG TERM GOAL #3   Title Incr. gait velocity to >/= 3.2 ft/sec without device for incr. gait efficiency   Baseline 11-23-14   Time 4   Period Weeks   Status On-going   PT LONG TERM GOAL #4   Title Report at least 30% improvement in balance and steadiness with functional mobility and ambulation.   Baseline 11-23-14   Time 4   Period Weeks   Status On-going          Plan - 11/15/14 1255    Clinical Impression Statement Pt needed max cueing with HEP to perform it correctly. Needs cues with gait as well. Pt advised to use cane for safety with gait at this time.   Pt will benefit from skilled therapeutic intervention in order to improve on the following deficits Abnormal gait;Impaired sensation;Decreased endurance;Decreased balance   Rehab Potential Good   PT Frequency 2x / week   PT Duration 8 weeks   PT Treatment/Interventions Therapeutic activities;Patient/family education;Therapeutic exercise;Gait training;Balance training;Neuromuscular re-education;Stair training   PT Next Visit Plan  Balance activities. Assess STG's.   PT Home Exercise Plan balance exercises and ankle strengthening exercises   Consulted and Agree with Plan of Care Patient        Problem List Patient Active Problem List   Diagnosis Date Noted  . Carpal tunnel syndrome 09/14/2014  . Memory difficulty 09/06/2014  . Gait disorder 09/06/2014  . 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 Ovarian cancer 12/15/2011    Willow Ora 11/16/2014, 12:58 PM  Willow Ora, PTA, [facility] [address] [place], [place] [address] [phone] 11/16/2014, 12:59 PM

## 2014-11-20 ENCOUNTER — Ambulatory Visit: Payer: Medicare Other | Admitting: Occupational Therapy

## 2014-11-20 ENCOUNTER — Ambulatory Visit: Payer: Medicare Other | Admitting: Physical Therapy

## 2014-11-21 ENCOUNTER — Telehealth: Payer: Self-pay | Admitting: Occupational Therapy

## 2014-11-21 ENCOUNTER — Ambulatory Visit: Payer: Medicare Other | Admitting: Occupational Therapy

## 2014-11-21 ENCOUNTER — Ambulatory Visit: Payer: Medicare Other | Admitting: Physical Therapy

## 2014-11-21 NOTE — Telephone Encounter (Signed)
Spoke to pt as she has missed multiple appointments since her eval.  Pt stated she has been sick and that is why she has cancelled so many times.  Confirmed with patient that she wants to continue with outpatient therapies and pt assured me she did.  Patient stated she would definitely be here next week.

## 2014-11-28 ENCOUNTER — Ambulatory Visit: Payer: Medicare Other | Admitting: Physical Therapy

## 2014-11-28 ENCOUNTER — Ambulatory Visit: Payer: Medicare Other | Admitting: Occupational Therapy

## 2014-11-29 ENCOUNTER — Ambulatory Visit: Payer: Medicare Other | Admitting: Physical Therapy

## 2014-11-29 ENCOUNTER — Ambulatory Visit: Payer: Medicare Other | Admitting: Occupational Therapy

## 2014-12-02 ENCOUNTER — Other Ambulatory Visit: Payer: Self-pay | Admitting: Neurology

## 2014-12-06 ENCOUNTER — Ambulatory Visit: Payer: Medicare Other | Admitting: Physical Therapy

## 2014-12-06 ENCOUNTER — Ambulatory Visit: Payer: Medicare Other | Admitting: Occupational Therapy

## 2014-12-12 ENCOUNTER — Ambulatory Visit: Payer: Medicare Other | Admitting: Occupational Therapy

## 2014-12-12 ENCOUNTER — Ambulatory Visit: Payer: Medicare Other | Admitting: Physical Therapy

## 2014-12-14 ENCOUNTER — Encounter: Payer: Self-pay | Admitting: Occupational Therapy

## 2014-12-14 NOTE — Therapy (Signed)
[facility] [address] [place], [place], [address] Phone: [phone]   Fax:  [phone]  Patient Details  Name: Peggy Conner MRN: [id number] Date of Birth: [birth year] Referring Provider:  No ref. provider found  Encounter Date: 12/14/2014  OCCUPATIONAL THERAPY DISCHARGE SUMMARY  Visits from Start of Care: 3  Current functional level related to goals / functional outcomes: OT Long Term Goals - 10/24/14 1500    OT LONG TERM GOAL #1   Title Pt will be I with upgraded HEP prn - 12/19/2014   Status New   OT LONG TERM GOAL #2   Title Pt will demonstrate improved grip strength combined by 8 pounds to assist with functional actvities.   Status New   OT LONG TERM GOAL #3   Title Pt will demonstrate improved coordination by decreasing 9 hole peg test combined by 10 seconds to assist with functional activities.   Status New   OT LONG TERM GOAL #4   Title Pt will be I with buttoning, zipping and cutting with AE prn   Status New   OT LONG TERM GOAL #5   Title Pt will rate shoulder pain no greater than 4/10 with activity   Status New   Long Term Additional Goals   Additional Long Term Goals Yes   OT LONG TERM GOAL #6   Title Pt will be able to participate in ambulatory activity for at least 15 minutes without a rest break.   Status New        Remaining deficits: Pt only participated in three sessions therefore all deficits remain - see eval   Education / Equipment: HEP  Plan: Patient agrees to discharge.  Patient goals were not met. Patient is being discharged due to the patient's request.  ?????     [name], OTR/L 12/14/2014, 8:48 AM  [facility] [address] [place], [place], [address] Phone: [phone]   Fax:  [phone]

## 2014-12-28 ENCOUNTER — Other Ambulatory Visit (INDEPENDENT_AMBULATORY_CARE_PROVIDER_SITE_OTHER): Payer: Medicare Other

## 2014-12-28 ENCOUNTER — Encounter: Payer: Self-pay | Admitting: Family

## 2014-12-28 ENCOUNTER — Ambulatory Visit (INDEPENDENT_AMBULATORY_CARE_PROVIDER_SITE_OTHER): Payer: Medicare Other | Admitting: Family

## 2014-12-28 VITALS — BP 144/88 | HR 83 | Temp 97.9°F | Resp 18 | Ht 64.0 in | Wt 152.8 lb

## 2014-12-28 DIAGNOSIS — R5383 Other fatigue: Secondary | ICD-10-CM

## 2014-12-28 DIAGNOSIS — R05 Cough: Secondary | ICD-10-CM

## 2014-12-28 DIAGNOSIS — I10 Essential (primary) hypertension: Secondary | ICD-10-CM

## 2014-12-28 DIAGNOSIS — R059 Cough, unspecified: Secondary | ICD-10-CM

## 2014-12-28 LAB — CBC
HCT: 38.7 % (ref 36.0–46.0)
Hemoglobin: 13.1 g/dL (ref 12.0–15.0)
MCHC: 33.9 g/dL (ref 30.0–36.0)
MCV: 87.9 fl (ref 78.0–100.0)
PLATELETS: 387 10*3/uL (ref 150.0–400.0)
RBC: 4.4 Mil/uL (ref 3.87–5.11)
RDW: 14.8 % (ref 11.5–15.5)
WBC: 6.8 10*3/uL (ref 4.0–10.5)

## 2014-12-28 LAB — TSH: TSH: 2.19 u[IU]/mL (ref 0.35–4.50)

## 2014-12-28 LAB — BASIC METABOLIC PANEL
BUN: 22 mg/dL (ref 6–23)
CHLORIDE: 93 meq/L — AB (ref 96–112)
CO2: 28 mEq/L (ref 19–32)
Calcium: 8.9 mg/dL (ref 8.4–10.5)
Creatinine, Ser: 0.8 mg/dL (ref 0.40–1.20)
GFR: 71.94 mL/min (ref >60.00)
GLUCOSE: 102 mg/dL — AB (ref 70–99)
POTASSIUM: 4.3 meq/L (ref 3.5–5.1)
SODIUM: 129 meq/L — AB (ref 135–145)

## 2014-12-28 NOTE — Progress Notes (Signed)
Pre visit review using our clinic review tool, if applicable. No additional management support is needed unless otherwise documented below in the visit note. 

## 2014-12-28 NOTE — Assessment & Plan Note (Signed)
Potentially related to gastroesophageal reflux. Start over-the-counter omeprazole on a daily basis. Low suspicion for infectious process, however obtain CBC to rule out. Medication list reviewed with no clear indications for cough.

## 2014-12-28 NOTE — Assessment & Plan Note (Signed)
Blood pressure remains stable with current regimen of losartan. Continue current dosage of losartan

## 2014-12-28 NOTE — Assessment & Plan Note (Signed)
Question source of fatigue. Cannot rule out depression as a result of her previous battle cancer. Obtain CBC, TSH, and basic metabolic panel to rule out metabolic causes. Low suspicion for diabetes. If all lab work comes back negative will consider cardiac evaluation to rule out cardiovascular disease. Follow-up if symptoms worsen or fail to improve.

## 2014-12-28 NOTE — Patient Instructions (Addendum)
Aspirin Cymbalta  Gabapentin Glucosamine-Chondritin Losartan Melatonin Omeprazole (Prilosec)    Thank you for choosing [facility].  Summary/Instructions:  Your prescription(s) have been submitted to your pharmacy or been printed and provided for you. Please take as directed and contact our office if you believe you are having problem(s) with the medication(s) or have any questions.  Please stop by the lab on the basement level of the building for your blood work. Your results will be released to [facility] (or called to you) after review, usually within 72 hours after test completion. If any changes need to be made, you will be notified at that same time.  If your symptoms worsen or fail to improve, please contact our office for further instruction, or in case of emergency go directly to the emergency room at the closest medical facility.    Cough, Adult  A cough is a reflex that helps clear your throat and airways. It can help heal the body or may be a reaction to an irritated airway. A cough may only last 2 or 3 weeks (acute) or may last more than 8 weeks (chronic).  CAUSES Acute cough:  Viral or bacterial infections. Chronic cough:  Infections.  Allergies.  Asthma.  Post-nasal drip.  Smoking.  Heartburn or acid reflux.  Some medicines.  Chronic lung problems (COPD).  Cancer. SYMPTOMS   Cough.  Fever.  Chest pain.  Increased breathing rate.  High-pitched whistling sound when breathing (wheezing).  Colored mucus that you cough up (sputum). TREATMENT   A bacterial cough may be treated with antibiotic medicine.  A viral cough must run its course and will not respond to antibiotics.  Your caregiver may recommend other treatments if you have a chronic cough. HOME CARE INSTRUCTIONS   Only take over-the-counter or prescription medicines for pain, discomfort, or fever as directed by your caregiver. Use cough suppressants only as directed by your  caregiver.  Use a cold steam vaporizer or humidifier in your bedroom or home to help loosen secretions.  Sleep in a semi-upright position if your cough is worse at night.  Rest as needed.  Stop smoking if you smoke. SEEK IMMEDIATE MEDICAL CARE IF:   You have pus in your sputum.  Your cough starts to worsen.  You cannot control your cough with suppressants and are losing sleep.  You begin coughing up blood.  You have difficulty breathing.  You develop pain which is getting worse or is uncontrolled with medicine.  You have a fever. MAKE SURE YOU:   Understand these instructions.  Will watch your condition.  Will get help right away if you are not doing well or get worse. Document Released: 05/16/2011 Document Revised: 02/09/2012 Document Reviewed: 05/16/2011 [facility] Patient Information 2015 [place], [place]. This information is not intended to replace advice given to you by your health care provider. Make sure you discuss any questions you have with your health care provider.  Fatigue Fatigue is a feeling of tiredness, lack of energy, lack of motivation, or feeling tired all the time. Having enough rest, good nutrition, and reducing stress will normally reduce fatigue. Consult your caregiver if it persists. The nature of your fatigue will help your caregiver to find out its cause. The treatment is based on the cause.  CAUSES  There are many causes for fatigue. Most of the time, fatigue can be traced to one or more of your habits or routines. Most causes fit into one or more of three general areas. They are: Lifestyle problems  Sleep disturbances.  Overwork.  Physical exertion.  Unhealthy habits.  Poor eating habits or eating disorders.  Alcohol and/or drug use .  Lack of proper nutrition (malnutrition). Psychological problems  Stress and/or anxiety problems.  Depression.  Grief.  Boredom. Medical Problems or Conditions  Anemia.  Pregnancy.  Thyroid  gland problems.  Recovery from major surgery.  Continuous pain.  Emphysema or asthma that is not well controlled  Allergic conditions.  Diabetes.  Infections (such as mononucleosis).  Obesity.  Sleep disorders, such as sleep apnea.  Heart failure or other heart-related problems.  Cancer.  Kidney disease.  Liver disease.  Effects of certain medicines such as antihistamines, cough and cold remedies, prescription pain medicines, heart and blood pressure medicines, drugs used for treatment of cancer, and some antidepressants. SYMPTOMS  The symptoms of fatigue include:   Lack of energy.  Lack of drive (motivation).  Drowsiness.  Feeling of indifference to the surroundings. DIAGNOSIS  The details of how you feel help guide your caregiver in finding out what is causing the fatigue. You will be asked about your present and past health condition. It is important to review all medicines that you take, including prescription and non-prescription items. A thorough exam will be done. You will be questioned about your feelings, habits, and normal lifestyle. Your caregiver may suggest blood tests, urine tests, or other tests to look for common medical causes of fatigue.  TREATMENT  Fatigue is treated by correcting the underlying cause. For example, if you have continuous pain or depression, treating these causes will improve how you feel. Similarly, adjusting the dose of certain medicines will help in reducing fatigue.  HOME CARE INSTRUCTIONS   Try to get the required amount of good sleep every night.  Eat a healthy and nutritious diet, and drink enough water throughout the day.  Practice ways of relaxing (including yoga or meditation).  Exercise regularly.  Make plans to change situations that cause stress. Act on those plans so that stresses decrease over time. Keep your work and personal routine reasonable.  Avoid street drugs and minimize use of alcohol.  Start taking a  daily multivitamin after consulting your caregiver. SEEK MEDICAL CARE IF:   You have persistent tiredness, which cannot be accounted for.  You have fever.  You have unintentional weight loss.  You have headaches.  You have disturbed sleep throughout the night.  You are feeling sad.  You have constipation.  You have dry skin.  You have gained weight.  You are taking any new or different medicines that you suspect are causing fatigue.  You are unable to sleep at night.  You develop any unusual swelling of your legs or other parts of your body. SEEK IMMEDIATE MEDICAL CARE IF:   You are feeling confused.  Your vision is blurred.  You feel faint or pass out.  You develop severe headache.  You develop severe abdominal, pelvic, or back pain.  You develop chest pain, shortness of breath, or an irregular or fast heartbeat.  You are unable to pass a normal amount of urine.  You develop abnormal bleeding such as bleeding from the rectum or you vomit blood.  You have thoughts about harming yourself or committing suicide.  You are worried that you might harm someone else. MAKE SURE YOU:   Understand these instructions.  Will watch your condition.  Will get help right away if you are not doing well or get worse. Document Released: 09/14/2007 Document Revised: 02/09/2012 Document Reviewed: 03/21/2014  [employer] Patient Information 2015 [place]. This information is not intended to replace advice given to you by your health care provider. Make sure you discuss any questions you have with your health care provider.

## 2014-12-28 NOTE — Progress Notes (Signed)
Subjective:    Patient ID: Peggy Conner, female    DOB: 27-Oct-1925, 81 y.o.   MRN: [id number]  Chief Complaint  Patient presents with  . Establish Care    says she has no energy, cough thats lasted a long time,     HPI:  Peggy Conner is a 81 y.o. female who presents today to establish care and discuss her energy level and cough  1) Cough - Associated symptom of coughing has been chronic that waxes and wanes. Has tried a variety of cough syrups and nasal sprays which provide some relief. Denies history of asthma. Is currently taking ranitidine as needed.   2) Fatigue - Generalized feeling of fatigue has been going on for about 9 years. States that she used to play golf on a regular basis but the pain in her feet prevents her from doing so. Indicates her nutrition is doing well.   3) Hypertension - currently treated with losartan.  BP Readings from Last 3 Encounters:  12/28/14 144/88  11/08/14 104/72  09/06/14 128/77    No Known Allergies   Current Outpatient Prescriptions on File Prior to Visit  Medication Sig Dispense Refill  . aspirin 81 MG tablet Take 81 mg by mouth daily.    . diclofenac sodium (VOLTAREN) 1 % GEL Apply 1 application topically daily.    . DULoxetine (CYMBALTA) 30 MG capsule TAKE ONE CAPSULE BY MOUTH EVERY DAY 30 capsule 5  . gabapentin (NEURONTIN) 300 MG capsule Take 300 mg by mouth at bedtime.    . Glucosamine-Chondroitin (GLUCOSAMINE CHONDR COMPLEX PO) Take by mouth daily.     Marland Kitchen losartan (COZAAR) 100 MG tablet Take 50 mg by mouth daily. 1/2 tab daily    . Melatonin 5 MG TABS Take by mouth daily.    . ranitidine (ZANTAC) 150 MG capsule Take 150 mg by mouth as needed.     . clonazePAM (KLONOPIN) 0.5 MG tablet Take 0.5 mg by mouth daily.     No current facility-administered medications on file prior to visit.    Past Medical History  Diagnosis Date  . Hypertension   . Depression   . Ovarian cancer     s/p surgery and chemothearpy  . Memory difficulty  09/06/2014  . Gait disorder 09/06/2014  . HOH (hard of hearing)     hearing aid, history of tinnitus  . Anxiety   . Arthritis of foot   . GERD (gastroesophageal reflux disease)   . Peripheral neuropathy   . Carpal tunnel syndrome 09/14/2014    Bilateral    Past Surgical History  Procedure Laterality Date  . Hip pinning  2009  . Ovarian cancer debulking   2007    Family History  Problem Relation Age of Onset  . Lung cancer Brother   . Heart attack Father   . Cancer Brother   . Lung cancer Brother     History   Social History  . Marital Status: Widowed    Spouse Name: N/A    Number of Children: 2  . Years of Education: 12   Occupational History  . Retired    Social History Main Topics  . Smoking status: Never Smoker   . Smokeless tobacco: Never Used  . Alcohol Use: Yes     Comment: occasional  . Drug Use: No  . Sexual Activity: Not on file   Other Topics Concern  . Not on file   Social History Narrative   Patient is married.  Patient has 2 children   Patient is retired.    Fun: Play golf, watch golf;      Review of Systems  Eyes:       Negative for changes in vision  Respiratory: Positive for cough. Negative for chest tightness.   Cardiovascular: Negative for chest pain, palpitations and leg swelling.      Objective:    BP 144/88 mmHg  Pulse 83  Temp(Src) 97.9 F (36.6 C) (Oral)  Resp 18  Ht 5\' 4"  (1.626 m)  Wt 152 lb 12.8 oz (69.31 kg)  BMI 26.22 kg/m2  SpO2 98% Nursing note and vital signs reviewed.  Physical Exam  Constitutional: She is oriented to person, place, and time. She appears well-developed and well-nourished. No distress.  Cardiovascular: Normal rate, regular rhythm, normal heart sounds and intact distal pulses.   Pulmonary/Chest: Effort normal and breath sounds normal.  Neurological: She is alert and oriented to person, place, and time.  Skin: Skin is warm and dry.  Psychiatric: She has a normal mood and affect. Her  behavior is normal. Judgment and thought content normal.       Assessment & Plan:

## 2014-12-29 LAB — CA 125: CA 125: 8 U/mL (ref <35)

## 2014-12-30 ENCOUNTER — Encounter: Payer: Self-pay | Admitting: Family

## 2015-02-12 ENCOUNTER — Telehealth: Payer: Self-pay

## 2015-02-12 NOTE — Telephone Encounter (Signed)
Patient confirms flu vaccine in October of 2015

## 2015-03-14 ENCOUNTER — Ambulatory Visit: Payer: Medicare Other | Admitting: Internal Medicine

## 2015-03-22 ENCOUNTER — Telehealth: Payer: Self-pay

## 2015-03-22 NOTE — Telephone Encounter (Signed)
Patient called to educate on Medicare Wellness apt. LVM for the patient to call back to educate and schedule for wellness visit.   

## 2015-03-29 ENCOUNTER — Encounter: Payer: Self-pay | Admitting: Family

## 2015-03-29 ENCOUNTER — Ambulatory Visit (INDEPENDENT_AMBULATORY_CARE_PROVIDER_SITE_OTHER): Payer: Medicare Other | Admitting: Family

## 2015-03-29 VITALS — BP 132/82 | HR 87 | Temp 98.1°F | Resp 18 | Ht 64.0 in | Wt 157.8 lb

## 2015-03-29 DIAGNOSIS — R05 Cough: Secondary | ICD-10-CM | POA: Diagnosis not present

## 2015-03-29 DIAGNOSIS — R5383 Other fatigue: Secondary | ICD-10-CM | POA: Diagnosis not present

## 2015-03-29 DIAGNOSIS — R059 Cough, unspecified: Secondary | ICD-10-CM

## 2015-03-29 MED ORDER — ALBUTEROL SULFATE HFA 108 (90 BASE) MCG/ACT IN AERS: 2.0000 | INHALATION_SPRAY | Freq: Four times a day (QID) | RESPIRATORY_TRACT | Status: DC | PRN

## 2015-03-29 MED ORDER — DICLOFENAC SODIUM 1 % TD GEL: 2.0000 g | Freq: Four times a day (QID) | TRANSDERMAL | Status: DC

## 2015-03-29 MED ORDER — AZITHROMYCIN 250 MG PO TABS: ORAL_TABLET | ORAL | Status: DC

## 2015-03-29 NOTE — Patient Instructions (Addendum)
Thank you for choosing [facility].  Summary/Instructions:  Your prescription(s) have been submitted to your pharmacy or been printed and provided for you. Please take as directed and contact our office if you believe you are having problem(s) with the medication(s) or have any questions.  If your symptoms worsen or fail to improve, please contact our office for further instruction, or in case of emergency go directly to the emergency room at the closest medical facility.   Please start taking ferrous sulfate 325 mg to help with fatigue.  For the cough - please start the azithromycin and robutussin/delsym as needed for cough.

## 2015-03-29 NOTE — Progress Notes (Addendum)
   Subjective:    Patient ID: Peggy Conner, female    DOB: [birth year], 81 y.o.   MRN: [id number]  Chief Complaint  Patient presents with  . Follow-up    x2 weeks, cough that has gotten really bad, at night it gets so bad to where she wants to throw up, she has a chronic cough already and says that it has gotten worse over the last couple of weeks    HPI:  Peggy Conner is a 81 y.o. female with a PMH of hypertension, cough, fatigue, memory difficulty and ovarian cancer who presents today for a follow up office visit.   1) Cough - associated symptom of chronic cough has been going on for several years. As of the last 2 weeks the cough has gotten progressively worse. The severity of the cough is enough to keep her from sleeping and also bad enough to make her become short of breath and even vomit. Notes an increase in the amount of phelgm and it is mostly clear to white.  2) Fatigue - previously seen for fatigue and blood work was negative with the exception of decreased iron. Patient has not started iron supplementation.   No Known Allergies   Current Outpatient Prescriptions on File Prior to Visit  Medication Sig Dispense Refill  . aspirin 81 MG tablet Take 81 mg by mouth daily.    . clonazePAM (KLONOPIN) 0.5 MG tablet Take 0.5 mg by mouth daily.    . diclofenac sodium (VOLTAREN) 1 % GEL Apply 1 application topically daily.    . DULoxetine (CYMBALTA) 30 MG capsule TAKE ONE CAPSULE BY MOUTH EVERY DAY 30 capsule 5  . gabapentin (NEURONTIN) 300 MG capsule Take 300 mg by mouth at bedtime.    . Glucosamine-Chondroitin (GLUCOSAMINE CHONDR COMPLEX PO) Take by mouth daily.     Marland Kitchen losartan (COZAAR) 100 MG tablet Take 50 mg by mouth daily. 1/2 tab daily    . Melatonin 5 MG TABS Take by mouth daily.    . ranitidine (ZANTAC) 150 MG capsule Take 150 mg by mouth as needed.      No current facility-administered medications on file prior to visit.    Review of Systems  Constitutional: Positive for fatigue.  Negative for fever and chills.  HENT: Negative for congestion.   Respiratory: Positive for cough.       Objective:    BP 132/82 mmHg  Pulse 87  Temp(Src) 98.1 F (36.7 C) (Oral)  Resp 18  Ht 5\' 4"  (1.626 m)  Wt 157 lb 12.8 oz (71.578 kg)  BMI 27.07 kg/m2  SpO2 96% Nursing note and vital signs reviewed.  Physical Exam  Constitutional: She is oriented to person, place, and time. She appears well-developed and well-nourished. No distress.  HENT:  Right Ear: Hearing, tympanic membrane, external ear and ear canal normal.  Left Ear: Hearing, tympanic membrane, external ear and ear canal normal.  Nose: Nose normal.  Mouth/Throat: Uvula is midline, oropharynx is clear and moist and mucous membranes are normal.  Cardiovascular: Normal rate, regular rhythm, normal heart sounds and intact distal pulses.   Pulmonary/Chest: Effort normal. She has wheezes.  Neurological: She is alert and oriented to person, place, and time.  Skin: Skin is warm and dry.  Psychiatric: She has a normal mood and affect. Her behavior is normal. Judgment and thought content normal.       Assessment & Plan:

## 2015-03-29 NOTE — Assessment & Plan Note (Signed)
Previous lab work was normal for metabolic causes of fatigue. It was noted a decrease in her iron stores. It was recommended at the time to start ferrous sulfate. Discussed with patient starting supplementation. She is willing to start. Start ferrous sulfate 325 daily. Recheck CBC in 3 months.

## 2015-03-29 NOTE — Progress Notes (Signed)
Pre visit review using our clinic review tool, if applicable. No additional management support is needed unless otherwise documented below in the visit note. 

## 2015-03-29 NOTE — Assessment & Plan Note (Signed)
Cough symptoms have increased over the past 2 weeks with increased frequency and increased production. Start azithromycin. Continue over-the-counter medications as needed for cough relief. Start albuterol for wheezing. Question possible COPD. Follow-up if symptoms worsen or fail to improve.

## 2015-04-10 ENCOUNTER — Ambulatory Visit: Payer: Medicare Other

## 2015-04-10 ENCOUNTER — Telehealth: Payer: Self-pay

## 2015-04-10 NOTE — Telephone Encounter (Signed)
Patient could not make appointment today.  She is requesting a call in regards.

## 2015-04-10 NOTE — Telephone Encounter (Signed)
Patient called in and cancelled apt because she did not feel like driving over today. Will reschedule when she feels better.

## 2015-05-07 ENCOUNTER — Ambulatory Visit (INDEPENDENT_AMBULATORY_CARE_PROVIDER_SITE_OTHER): Payer: Medicare Other | Admitting: Internal Medicine

## 2015-05-07 ENCOUNTER — Other Ambulatory Visit (INDEPENDENT_AMBULATORY_CARE_PROVIDER_SITE_OTHER): Payer: Medicare Other

## 2015-05-07 ENCOUNTER — Encounter: Payer: Self-pay | Admitting: Internal Medicine

## 2015-05-07 VITALS — HR 96 | Temp 98.2°F | Resp 14 | Ht 64.0 in | Wt 154.8 lb

## 2015-05-07 DIAGNOSIS — J310 Chronic rhinitis: Secondary | ICD-10-CM

## 2015-05-07 DIAGNOSIS — E871 Hypo-osmolality and hyponatremia: Secondary | ICD-10-CM | POA: Diagnosis not present

## 2015-05-07 DIAGNOSIS — E876 Hypokalemia: Secondary | ICD-10-CM | POA: Diagnosis not present

## 2015-05-07 DIAGNOSIS — R55 Syncope and collapse: Secondary | ICD-10-CM

## 2015-05-07 LAB — BASIC METABOLIC PANEL
BUN: 14 mg/dL (ref 6–23)
CALCIUM: 9.4 mg/dL (ref 8.4–10.5)
CO2: 34 mEq/L — ABNORMAL HIGH (ref 19–32)
Chloride: 93 mEq/L — ABNORMAL LOW (ref 96–112)
Creatinine, Ser: 0.66 mg/dL (ref 0.40–1.20)
GFR: 89.75 mL/min (ref >60.00)
Glucose, Bld: 64 mg/dL — ABNORMAL LOW (ref 70–99)
POTASSIUM: 4.4 meq/L (ref 3.5–5.1)
Sodium: 130 mEq/L — ABNORMAL LOW (ref 135–145)

## 2015-05-07 NOTE — Patient Instructions (Signed)
Plain Mucinex (NOT D) for thick secretions ;force NON dairy fluids .   Nasal cleansing in the shower as discussed with lather of mild shampoo.After 10 seconds wash off lather while  exhaling through nostrils. Make sure that all residual soap is removed to prevent irritation.  Flonase OR Nasacort AQ 1 spray in each nostril twice a day as needed. Use the "crossover" technique into opposite nostril spraying toward opposite ear @ 45 degree angle, not straight up into nostril.  Plain Allegra (NOT D )  160 daily , Loratidine 10 mg , OR Zyrtec 10 mg @ bedtime  as needed for itchy eyes & sneezing.   Your next office appointment will be determined based upon review of your pending labs.  Those written interpretation of the lab results and instructions will be transmitted to you by My Chart  Critical results will be called.  Followup as needed for any active or acute issue. Please report any significant change in your symptoms.

## 2015-05-07 NOTE — Progress Notes (Signed)
   Subjective:    Patient ID: Peggy Conner, female    DOB: [birth year], 81 y.o.   MRN: [id number]  [place] began to have frank diarrhea 5/27. This was constant until the next day. It resolved with 2 doses of Imodium. On 5/29 she had frank syncope stating "everything went black". There was no definite neurocardiac prodrome prior to the episode.  Her family did treat her with increased fluids including ginger ale, water,& chicken noodle soup during the acute illness.  She was hospitalized for observation for 24 hours 5/29-5/30/16 at [facility] The diagnosis was syncope related to dehydration. Apparently her sodium and potassium were low. Those records will be obtained.  At this time her only complaints are chronic dizziness. She does awaken with diaphoresis approximately 4 AM routinely. This is been present for years.  She also has a dry cough in the context of postnasal drainage. She denies significant reflux symptoms    Review of Systems   Denied were any change in heart rhythm or rate prior to the syncope. There was no associated chest pain or shortness of breath .  Also specifically denied prior to the episode were headache, limb weakness, tingling, or numbness. No seizure activity noted.  She denies significant sneezing, itchy/watery eyes, or wheezing.  She has no upper respiratory tract respiratory infection symptoms such as frontal headache, facial pain , nasal purulence, dental pain, sore throat , otic pain or otic discharge denied. No fever , chills or sweats.  Unexplained weight loss, abdominal pain, significant dyspepsia, dysphagia, melena, rectal bleeding, or persistently small caliber stools are denied.    Objective:   Physical Exam Pertinent or positive findings include: She has bilateral ptosis. Breath sounds are somewhat decreased. She has marked mixed DIP/DIP changes of the fingers.   General appearance :adequately  nourished; in no distress. Appears younger than stated age Eyes: No conjunctival inflammation or scleral icterus is present.  Oral exam:  Lips and gums are healthy appearing.There is no oropharyngeal erythema or exudate noted. Dental hygiene is good.  Heart:  Normal rate and regular rhythm. S1 and S2 normal without gallop, murmur, click, rub or other extra sounds    Lungs:Chest clear to auscultation; no wheezes, rhonchi,rales ,or rubs present.No increased work of breathing.   Abdomen: bowel sounds normal, soft and non-tender without masses, organomegaly or hernias noted.  No guarding or rebound.  Vascular : all pulses equal ; no bruits present.  Skin:Warm & dry.  Intact without suspicious lesions or rashes ; no tenting or jaundice   Lymphatic: No lymphadenopathy is noted about the head, neck, axilla.   Neuro: Strength, tone decreased. Ambulating with a cane..      Assessment & Plan:  #1 syncope in the context of severe diarrhea  #2 hyponatremia and hypokalemia  Plan: See orders

## 2015-05-07 NOTE — Progress Notes (Signed)
Pre visit review using our clinic review tool, if applicable. No additional management support is needed unless otherwise documented below in the visit note. 

## 2015-05-08 ENCOUNTER — Ambulatory Visit: Payer: Medicare Other | Admitting: Family

## 2015-05-10 ENCOUNTER — Encounter: Payer: Self-pay | Admitting: Neurology

## 2015-05-10 ENCOUNTER — Ambulatory Visit (INDEPENDENT_AMBULATORY_CARE_PROVIDER_SITE_OTHER): Payer: Medicare Other | Admitting: Neurology

## 2015-05-10 VITALS — BP 152/93 | HR 94 | Ht 64.0 in | Wt 155.0 lb

## 2015-05-10 DIAGNOSIS — R269 Unspecified abnormalities of gait and mobility: Secondary | ICD-10-CM | POA: Diagnosis not present

## 2015-05-10 DIAGNOSIS — R413 Other amnesia: Secondary | ICD-10-CM | POA: Diagnosis not present

## 2015-05-10 MED ORDER — DONEPEZIL HCL 5 MG PO TABS: 5.0000 mg | ORAL_TABLET | Freq: Every day | ORAL | Status: DC

## 2015-05-10 NOTE — Patient Instructions (Signed)
Begin Aricept (donepezil) at 5 mg at night for one month. If this medication is well-tolerated, please call our office and we will call in a prescription for the 10 mg tablets. Look out for side effects that may include nausea, diarrhea, weight loss, or stomach cramps. This medication will also cause a runny nose, therefore there is no need for allergy medications for this purpose.  Fall Prevention and Home Safety Falls cause injuries and can affect all age groups. It is possible to use preventive measures to significantly decrease the likelihood of falls. There are many simple measures which can make your home safer and prevent falls. OUTDOORS  Repair cracks and edges of walkways and driveways.  Remove high doorway thresholds.  Trim shrubbery on the main path into your home.  Have good outside lighting.  Clear walkways of tools, rocks, debris, and clutter.  Check that handrails are not broken and are securely fastened. Both sides of steps should have handrails.  Have leaves, snow, and ice cleared regularly.  Use sand or salt on walkways during winter months.  In the garage, clean up grease or oil spills. BATHROOM  Install night lights.  Install grab bars by the toilet and in the tub and shower.  Use non-skid mats or decals in the tub or shower.  Place a plastic non-slip stool in the shower to sit on, if needed.  Keep floors dry and clean up all water on the floor immediately.  Remove soap buildup in the tub or shower on a regular basis.  Secure bath mats with non-slip, double-sided rug tape.  Remove throw rugs and tripping hazards from the floors. BEDROOMS  Install night lights.  Make sure a bedside light is easy to reach.  Do not use oversized bedding.  Keep a telephone by your bedside.  Have a firm chair with side arms to use for getting dressed.  Remove throw rugs and tripping hazards from the floor. KITCHEN  Keep handles on pots and pans turned toward  the center of the stove. Use back burners when possible.  Clean up spills quickly and allow time for drying.  Avoid walking on wet floors.  Avoid hot utensils and knives.  Position shelves so they are not too high or low.  Place commonly used objects within easy reach.  If necessary, use a sturdy step stool with a grab bar when reaching.  Keep electrical cables out of the way.  Do not use floor polish or wax that makes floors slippery. If you must use wax, use non-skid floor wax.  Remove throw rugs and tripping hazards from the floor. STAIRWAYS  Never leave objects on stairs.  Place handrails on both sides of stairways and use them. Fix any loose handrails. Make sure handrails on both sides of the stairways are as long as the stairs.  Check carpeting to make sure it is firmly attached along stairs. Make repairs to worn or loose carpet promptly.  Avoid placing throw rugs at the top or bottom of stairways, or properly secure the rug with carpet tape to prevent slippage. Get rid of throw rugs, if possible.  Have an electrician put in a light switch at the top and bottom of the stairs. OTHER FALL PREVENTION TIPS  Wear low-heel or rubber-soled shoes that are supportive and fit well. Wear closed toe shoes.  When using a stepladder, make sure it is fully opened and both spreaders are firmly locked. Do not climb a closed stepladder.  Add color or  contrast paint or tape to grab bars and handrails in your home. Place contrasting color strips on first and last steps.  Learn and use mobility aids as needed. Install an electrical emergency response system.  Turn on lights to avoid dark areas. Replace light bulbs that burn out immediately. Get light switches that glow.  Arrange furniture to create clear pathways. Keep furniture in the same place.  Firmly attach carpet with non-skid or double-sided tape.  Eliminate uneven floor surfaces.  Select a carpet pattern that does not  visually hide the edge of steps.  Be aware of all pets. OTHER HOME SAFETY TIPS  Set the water temperature for 120 F (48.8 C).  Keep emergency numbers on or near the telephone.  Keep smoke detectors on every level of the home and near sleeping areas. Document Released: 11/07/2002 Document Revised: 05/18/2012 Document Reviewed: 02/06/2012 [facility] Patient Information 2015 [place], [place]. This information is not intended to replace advice given to you by your health care provider. Make sure you discuss any questions you have with your health care provider.

## 2015-05-10 NOTE — Progress Notes (Signed)
Reason for visit: Memory disturbance  Peggy Conner is an 81 y.o. female  History of present illness:  Peggy Conner is an 81 year old right-handed white female with a history of a memory disturbance that is progressive. 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 The patient has had ongoing progression of her memory issues over time. The patient lives alone, but her daughter has taken over her finances, she is no longer operating a motor vehicle, and she is getting aid and assistance 4 days a week. The patient is becoming socially withdrawn, she does not feel like getting out of the house much. The patient does have some balance issues, she has fallen on occasion. She uses a cane for ambulation. The patient is on Cymbalta for her peripheral neuropathy discomfort. She takes 30 mg daily of this. There is some question whether there may be some underlying depression. The last fall that the patient had was about 2 weeks ago. The patient comes to this office for an evaluation.  Past Medical History  Diagnosis Date  . Hypertension   . Depression   . Ovarian cancer     s/p surgery and chemothearpy  . Memory difficulty 09/06/2014  . Gait disorder 09/06/2014  . HOH (hard of hearing)     hearing aid, history of tinnitus  . Anxiety   . Arthritis of foot   . GERD (gastroesophageal reflux disease)   . Peripheral neuropathy   . Carpal tunnel syndrome 09/14/2014    Bilateral    Past Surgical History  Procedure Laterality Date  . Hip pinning  2009  . Ovarian cancer debulking   2007    Family History  Problem Relation Age of Onset  . Lung cancer Brother   . Heart attack Father   . Cancer Brother   . Lung cancer Brother     Social history:  reports that she has never smoked. She has never used smokeless tobacco. She reports that she does not drink alcohol or use illicit drugs.      Allergies  Allergen Reactions  . Pregabalin Other (See Comments)    Medications:  Prior to Admission medications   Medication Sig Start Date End Date Taking? Authorizing Provider  albuterol (PROVENTIL HFA;VENTOLIN HFA) 108 (90 BASE) MCG/ACT inhaler Inhale 2 puffs into the lungs every 6 (six) hours as needed for wheezing or shortness of breath. 03/29/15  Yes Peggy Circle, FNP  aspirin 81 MG tablet Take 81 mg by mouth daily.   Yes Historical Provider, MD  diclofenac sodium (VOLTAREN) 1 % GEL Apply 2 g topically 4 (four) times daily. 03/29/15  Yes Peggy Circle, FNP  DULoxetine (CYMBALTA) 30 MG capsule TAKE ONE CAPSULE BY MOUTH EVERY DAY 12/03/14  Yes Kathrynn Ducking, MD  gabapentin (NEURONTIN) 300 MG capsule Take 300 mg by mouth at bedtime.   Yes Historical Provider, MD  losartan (COZAAR) 100 MG tablet Take 50 mg by mouth daily. 1/2 tab daily   Yes Historical Provider, MD  Melatonin 5 MG TABS Take by mouth daily.   Yes Historical Provider, MD  ranitidine (ZANTAC) 150 MG capsule Take 150 mg by mouth as needed.    Yes Historical Provider, MD  vitamin B-12 (CYANOCOBALAMIN) 1000 MCG tablet Take 1,000 mcg by mouth daily.   Yes Historical Provider, MD    ROS:  Out of a complete 14 system review of symptoms,  the patient complains only of the following symptoms, and all other reviewed systems are negative.  Walking difficulty Memory loss, dizziness Confusion  Blood pressure 152/93, pulse 94, height 5\' 4"  (1.626 m), weight 155 lb (70.308 kg).  Physical Exam  General: The patient is alert and cooperative at the time of the examination.  Skin: No significant peripheral edema is noted.   Neurologic Exam  Mental status: The patient is alert and oriented x 3 at the time of the examination. The Mini-Mental Status Examination done today shows a total score of 28/30. The patient is able to name 4 animals in 30 seconds.   Cranial nerves: Facial symmetry is present. Speech is normal, no  aphasia or dysarthria is noted. Extraocular movements are full. Visual fields are full.  Motor: The patient has good strength in all 4 extremities.  Sensory examination: Soft touch sensation is symmetric on the face, arms, and legs.  Coordination: The patient has good finger-nose-finger and heel-to-shin bilaterally.  Gait and station: The patient has a normal gait. Tandem gait is slightly unsteady. Romberg is negative. No drift is seen.  Reflexes: Deep tendon reflexes are symmetric.   MRI brain 09/20/14:  IMPRESSION:  Abnormal MRI brain (without) demonstrating: 1. Mild diffuse, moderate perisylvian and severe mesial temporal atrophy. 2. Moderate-severe periventricular and subcortical and pontine chronic small vessel ischemic disease.  3. No acute findings. Mild progression of atrophy and chronic small vessel ischemic disease since 01/28/11.  * MRI scan images were reviewed online. I agree with the written report.    Assessment/Plan:  1. Memory disturbance  2. Cerebrovascular disease by MRI  3. Gait disorder  4. Peripheral neuropathy  The patient will be placed on low-dose Aricept at this time, taking 5 mg at night. The patient will be set up for physical therapy for gait training. She will follow-up in 6 months. The family will contact me after one month if she is tolerating the Aricept, she will be placed on the maintenance dose of 10 mg at night.  Jill Alexanders MD 05/10/2015 7:38 PM  [facility] [address] [place], [place] [address]  Phone [phone] Fax [phone]

## 2015-05-28 ENCOUNTER — Other Ambulatory Visit: Payer: Self-pay

## 2015-06-06 ENCOUNTER — Other Ambulatory Visit: Payer: Self-pay | Admitting: Neurology

## 2015-06-19 ENCOUNTER — Encounter: Payer: Self-pay | Admitting: Neurology

## 2015-06-19 NOTE — Telephone Encounter (Signed)
I called [facility] and spoke to Almyra Free to follow up on the PT referral. She stated she would call to schedule an appointment right then. I waited a little while and called the patient's daughter. She stated she would check with her mom and the caretaker with her to see if Almyra Free called and would let me know if they did not hear from her. She said her mom's memory seems to be getting worse. She is not sure if the Aricept really does affect her mother's sleep or if she is just confused about it because sometimes she just decides not to take it. She would like Dr. Jannifer Franklin' opinion on what to do about the medication. Would it be best to try something different?

## 2015-06-20 ENCOUNTER — Ambulatory Visit: Payer: Medicare Other | Attending: Neurology | Admitting: Physical Therapy

## 2015-06-20 DIAGNOSIS — M6281 Muscle weakness (generalized): Secondary | ICD-10-CM | POA: Insufficient documentation

## 2015-06-20 DIAGNOSIS — R5381 Other malaise: Secondary | ICD-10-CM | POA: Insufficient documentation

## 2015-06-20 DIAGNOSIS — R269 Unspecified abnormalities of gait and mobility: Secondary | ICD-10-CM | POA: Diagnosis not present

## 2015-06-20 DIAGNOSIS — R279 Unspecified lack of coordination: Secondary | ICD-10-CM | POA: Insufficient documentation

## 2015-06-21 NOTE — Therapy (Addendum)
[facility] [address] [place], [place], [address] Phone: [phone]   Fax:  [phone]  Physical Therapy Evaluation  Patient Details  Name: Peggy Conner MRN: [id number] Date of Birth: 09-17-1925 Referring Provider:  Kathrynn Ducking, MD  Encounter Date: 06/20/2015      PT End of Session - 06/21/15 1426    Visit Number 1  new episode of care started 06/20/15   Number of Visits 9   Date for PT Re-Evaluation 08/20/15   Authorization Type Medicare G-code every 10th visit   PT Start Time 1148   PT Stop Time 1226   PT Time Calculation (min) 38 min   Activity Tolerance Patient tolerated treatment well   Behavior During Therapy [facility] for tasks assessed/performed      Past Medical History  Diagnosis Date  . Hypertension   . Depression   . Ovarian cancer     s/p surgery and chemothearpy  . Memory difficulty 09/06/2014  . Gait disorder 09/06/2014  . HOH (hard of hearing)     hearing aid, history of tinnitus  . Anxiety   . Arthritis of foot   . GERD (gastroesophageal reflux disease)   . Peripheral neuropathy   . Carpal tunnel syndrome 09/14/2014    Bilateral    Past Surgical History  Procedure Laterality Date  . Hip pinning  2009  . Ovarian cancer debulking   2007    There were no vitals filed for this visit.  Visit Diagnosis:  Abnormality of gait      Subjective Assessment - 06/21/15 1418    Subjective Pt presents to OP PT with reports of difficulty with walking and balance over the past several months.  Saw Dr. Jannifer Franklin in June 2016, and he recommended PT.  She has been using cane for approximately 6 months, but she reports no falls.   Patient Stated Goals She wants to walk better and get stronger   Currently in Pain? No/denies            [facility] PT Assessment - 06/21/15 0001    Assessment   Medical Diagnosis gait disorder   Onset Date/Surgical Date 05/10/15  MD visit (Dr. Willis)-neurologist   Precautions   Precautions Fall   Balance Screen   Has the patient fallen in the past 6 months No   Has the patient had a decrease in activity level because of a fear of falling?  Yes   Is the patient reluctant to leave their home because of a fear of falling?  No   Home Environment   Living Environment Private residence   Living Arrangements Alone   Available Help at Discharge Family   Type of [place] to enter  1 step   Entrance Stairs-Number of Steps 1   Home Layout One level   Prior Function   Level of Independence Independent with basic ADLs;Independent with gait;Independent with transfers   Transfers   Transfers Sit to Stand;Stand to Sit   Sit to Stand 6: Modified independent (Device/Increase time);With upper extremity assist;With armrests;From chair/3-in-1   Stand to Sit 6: Modified independent (Device/Increase time);With upper extremity assist;With armrests;To chair/3-in-1   Ambulation/Gait   Ambulation/Gait Yes   Ambulation/Gait Assistance 5: Supervision   Ambulation/Gait Assistance Details Pt tends to carry cane, needs several reminders to use cane appropriately with gait   Ambulation Distance (Feet) 150 Feet   Assistive device Straight cane   Gait Pattern Step-through pattern;Wide base of  support   Ambulation Surface Level;Indoor   Gait velocity 16 sec= 2.05 ft/sec   Standardized Balance Assessment   Standardized Balance Assessment Berg Balance Test;Timed Up and Go Test   Berg Balance Test   Sit to Stand Able to stand  independently using hands   Standing Unsupported Able to stand 2 minutes with supervision   Sitting with Back Unsupported but Feet Supported on Floor or Stool Able to sit safely and securely 2 minutes   Stand to Sit Controls descent by using hands   Transfers Able to transfer safely, definite need of hands   Standing Unsupported with Eyes Closed Able to stand 10 seconds with supervision   Standing Ubsupported with Feet Together  Able to place feet together independently and stand for 1 minute with supervision   From Standing, Reach Forward with Outstretched Arm Can reach forward >12 cm safely (5")   From Standing Position, Pick up Object from [place] to pick up shoe safely and easily   From Standing Position, Turn to Look Behind Over each Shoulder Looks behind from both sides and weight shifts well   Turn 360 Degrees Able to turn 360 degrees safely but slowly   Standing Unsupported, Alternately Place Feet on Step/Stool Able to stand independently and complete 8 steps >20 seconds   Standing Unsupported, One Foot in Front Needs help to step but can hold 15 seconds   Standing on One Leg Unable to try or needs assist to prevent fall   Total Score 39   Timed Up and Go Test   Normal TUG (seconds) 17.29                              PT Long Term Goals - 06/21/15 1432    PT LONG TERM GOAL #1   Title Pt will improve Berg score to at least 44/56 for decreased fall risk.  Target 07/20/15   Time 4   Period Weeks   Status New   PT LONG TERM GOAL #2   Title Pt will improve TUG score to less than or equal to 13.5 seconds for decreased fall risk.   Time 4   Period Weeks   Status New   PT LONG TERM GOAL #3   Title Pt will improve gait velocity to at least 2.62 ft/sec for imrpoved gait efficiency and safety with gait.   Time 4   Period Weeks   Status New   PT LONG TERM GOAL #4   Title Pt will verbalize fall prevention techniques within home environment.   Time 4   Period Weeks   Status New               Plan - 06/21/15 1428    Clinical Impression Statement Pt is an 81 year old female who presents to OP PT with reports in changes in balance, gait and overall functional mobility level in the past 3-6 months.  She saw Dr. Jannifer Franklin in June 2016.  She uses cane for gait, but she reports no falls.  She is at fall risk per Berg score of 39/56, TUG score of 17.29 seconds and she has reduced gait  speed of 2.05 f/tsec.  She reports having overall decreased functional mobility.  She would benefit from skilled physical therapy to address strength, balance and gait training for overall improved functional mobility and decreased fall risk.   Pt will benefit from skilled therapeutic intervention in order to  improve on the following deficits Abnormal gait;Decreased balance;Decreased strength;Difficulty walking   Rehab Potential Good   Clinical Impairments Affecting Rehab Potential memory deficits-may need to have caregiver/family member present for HEP instruction   PT Frequency 2x / week   PT Duration 4 weeks  plus evaluation   PT Treatment/Interventions Therapeutic activities;Patient/family education;Therapeutic exercise;Gait training;Balance training;Neuromuscular re-education;Stair training;ADLs/Self Care Home Management;Functional mobility training   PT Next Visit Plan Initiate OTAGO and walking program for home.   Consulted and Agree with Plan of Care Patient          G-Codes - 07-09-2015 1435    Functional Assessment Tool Used TUG 17.29 seconds, Berg 39/56; gait velocity 2.05 ft/sec   Functional Limitation Mobility: Walking and moving around   Mobility: Walking and Moving Around Current Status [phone]) At least 20 percent but less than 40 percent impaired, limited or restricted   Mobility: Walking and Moving Around Goal Status [phone]) At least 1 percent but less than 20 percent impaired, limited or restricted       Problem List Patient Active Problem List   Diagnosis Date Noted  . Cough 12/28/2014  . Fatigue 12/28/2014  . Essential hypertension 12/28/2014  . Carpal tunnel syndrome 09/14/2014  . Memory difficulty 09/06/2014  . Gait disorder 09/06/2014  . 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 Ovarian cancer 12/15/2011    Frazier Butt. 06/21/2015, 2:36 PM Frazier Butt., PT [facility] [address] [place],  [place], [address] Phone: [phone]   Fax:  [phone]

## 2015-06-22 ENCOUNTER — Encounter: Payer: Self-pay | Admitting: Physical Therapy

## 2015-06-22 ENCOUNTER — Ambulatory Visit: Payer: Medicare Other | Admitting: Physical Therapy

## 2015-06-22 DIAGNOSIS — R278 Other lack of coordination: Secondary | ICD-10-CM

## 2015-06-22 DIAGNOSIS — R269 Unspecified abnormalities of gait and mobility: Secondary | ICD-10-CM | POA: Diagnosis not present

## 2015-06-22 DIAGNOSIS — M6281 Muscle weakness (generalized): Secondary | ICD-10-CM

## 2015-06-22 DIAGNOSIS — R5381 Other malaise: Secondary | ICD-10-CM

## 2015-06-22 DIAGNOSIS — R279 Unspecified lack of coordination: Secondary | ICD-10-CM

## 2015-06-23 NOTE — Therapy (Signed)
[facility] [address] [place], [place], [address] Phone: [phone]   Fax:  [phone]  Physical Therapy Treatment  Patient Details  Name: Peggy Conner MRN: [id number] Date of Birth: [birth year] Referring Provider:  Golden Circle, FNP  Encounter Date: 06/22/2015      PT End of Session - 06/22/15 1108    Visit Number 2  new episode of care started 06/20/15   Number of Visits 9   Date for PT Re-Evaluation 08/20/15   Authorization Type Medicare G-code every 10th visit   PT Start Time 1104   PT Stop Time 1145   PT Time Calculation (min) 41 min   Activity Tolerance Patient tolerated treatment well   Behavior During Therapy [facility] for tasks assessed/performed      Past Medical History  Diagnosis Date  . Hypertension   . Depression   . Ovarian cancer     s/p surgery and chemothearpy  . Memory difficulty 09/06/2014  . Gait disorder 09/06/2014  . HOH (hard of hearing)     hearing aid, history of tinnitus  . Anxiety   . Arthritis of foot   . GERD (gastroesophageal reflux disease)   . Peripheral neuropathy   . Carpal tunnel syndrome 09/14/2014    Bilateral    Past Surgical History  Procedure Laterality Date  . Hip pinning  2009  . Ovarian cancer debulking   2007    There were no vitals filed for this visit.  Visit Diagnosis:  Abnormality of gait  Generalized muscle weakness  Decreased coordination  Debility      Subjective Assessment - 06/22/15 1107    Subjective Reports she just had a terrible coughing spell out in the lobby, better now. No falls or pain to report.   Currently in Pain? No/denies             Balance Exercises - 06/22/15 1109    OTAGO PROGRAM   Head Movements Standing;5 reps   Neck Movements Standing;5 reps   Back Extension Standing;5 reps   Trunk Movements Standing;5 reps   Ankle Movements Sitting;10 reps   Knee Extensor 10 reps;Other reps (comment)  red theraband   Knee Flexor 10 reps   Hip ABductor 10 reps   Ankle Plantorflexors 20 reps, support   Ankle Dorsiflexors 20 reps, support   Knee Bends 10 reps, support   Backwards Walking Support   Walking and Turning Around No assistive device   Sideways Walking No assistive device           PT Long Term Goals - 06/21/15 1432    PT LONG TERM GOAL #1   Title Pt will improve Berg score to at least 44/56 for decreased fall risk.  Target 07/20/15   Time 4   Period Weeks   Status New   PT LONG TERM GOAL #2   Title Pt will improve TUG score to less than or equal to 13.5 seconds for decreased fall risk.   Time 4   Period Weeks   Status New   PT LONG TERM GOAL #3   Title Pt will improve gait velocity to at least 2.62 ft/sec for imrpoved gait efficiency and safety with gait.   Time 4   Period Weeks   Status New   PT LONG TERM GOAL #4   Title Pt will verbalize fall prevention techniques within home environment.   Time 4   Period Weeks   Status New  Plan - 06/22/15 1108    Clinical Impression Statement Initiated [place] program with no issues reported. Educated pt on proper shoe wear as well (currenly wearing open toe sandles). Pt plan's to buy new sneakers soon as her's are old and out of shape. Progressing toward goals.   Pt will benefit from skilled therapeutic intervention in order to improve on the following deficits Abnormal gait;Decreased balance;Decreased strength;Difficulty walking   Rehab Potential Good   Clinical Impairments Affecting Rehab Potential memory deficits-may need to have caregiver/family member present for HEP instruction   PT Frequency 2x / week   PT Duration 4 weeks  plus evaluation   PT Treatment/Interventions Therapeutic activities;Patient/family education;Therapeutic exercise;Gait training;Balance training;Neuromuscular re-education;Stair training;ADLs/Self Care Home Management;Functional mobility training   PT Next Visit Plan complete OTAGO program; work on  balancea and Media planner and Agree with Plan of Care Patient        Problem List Patient Active Problem List   Diagnosis Date Noted  . Cough 12/28/2014  . Fatigue 12/28/2014  . Essential hypertension 12/28/2014  . Carpal tunnel syndrome 09/14/2014  . Memory difficulty 09/06/2014  . Gait disorder 09/06/2014  . 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 Ovarian cancer 12/15/2011    Willow Ora 06/23/2015, 12:25 PM  Willow Ora, PTA, [facility] [address], [place], [place] [address] [phone] 06/23/2015, 12:25 PM

## 2015-06-25 ENCOUNTER — Ambulatory Visit: Payer: Medicare Other | Admitting: Physical Therapy

## 2015-06-26 ENCOUNTER — Ambulatory Visit: Payer: Medicare Other | Admitting: Physical Therapy

## 2015-06-26 DIAGNOSIS — R269 Unspecified abnormalities of gait and mobility: Secondary | ICD-10-CM | POA: Diagnosis not present

## 2015-06-26 NOTE — Therapy (Signed)
[facility] [address] [place], [place], [address] Phone: [phone]   Fax:  [phone]  Physical Therapy Treatment  Patient Details  Name: Peggy Conner MRN: [id number] Date of Birth: 1925-12-01 Referring Provider:  Golden Circle, FNP  Encounter Date: 06/26/2015      PT End of Session - 06/26/15 1510    Visit Number 3   Number of Visits 9   Date for PT Re-Evaluation 08/20/15   Authorization Type Medicare G-code every 10th visit   PT Start Time 1408   PT Stop Time 1446   PT Time Calculation (min) 38 min   Activity Tolerance Patient tolerated treatment well   Behavior During Therapy [facility] for tasks assessed/performed      Past Medical History  Diagnosis Date  . Hypertension   . Depression   . Ovarian cancer     s/p surgery and chemothearpy  . Memory difficulty 09/06/2014  . Gait disorder 09/06/2014  . HOH (hard of hearing)     hearing aid, history of tinnitus  . Anxiety   . Arthritis of foot   . GERD (gastroesophageal reflux disease)   . Peripheral neuropathy   . Carpal tunnel syndrome 09/14/2014    Bilateral    Past Surgical History  Procedure Laterality Date  . Hip pinning  2009  . Ovarian cancer debulking   2007    There were no vitals filed for this visit.  Visit Diagnosis:  Abnormality of gait      Subjective Assessment - 06/26/15 1411    Subjective No falls to report; no energy today.  In answer to PT question:  she reports not doing exercises at home and she did not bring HEP folder in today   Currently in Pain? No/denies                              Balance Exercises - 06/26/15 1411    OTAGO PROGRAM   Head Movements Standing;5 reps   Neck Movements Standing;5 reps   Back Extension Standing;5 reps   Trunk Movements Standing;5 reps   Ankle Movements Sitting;10 reps   Knee Extensor 10 reps   Knee Flexor 10 reps   Hip ABductor 10 reps  facing counter-bilateral  UE support   Ankle Plantorflexors 20 reps, support   Ankle Dorsiflexors 20 reps, support   Knee Bends 10 reps, support   Backwards Walking Support   Walking and Turning Around Assistive device  cane   Sideways Walking Assistive device  counter   Tandem Stance 10 seconds, support   Tandem Walk Support  10 ft 4 reps   One Leg Stand 10 seconds, support   Heel Walking Support  10 ft 4 reps   Toe Walk Support  10 ft 4 reps   Sit to Stand 10 reps, bilateral support   Overall OTAGO Comments Discussed importance of HEP; encouraged pt to bring back folder next visit to add remaining exercises to HEP; discussed with pt having caregiver come back next session to see pt perform HEP     Pt requires 2 seated rest breaks during OTAGO exercise program.           PT Long Term Goals - 06/21/15 1432    PT LONG TERM GOAL #1   Title Pt will improve Berg score to at least 44/56 for decreased fall risk.  Target 07/20/15   Time 4   Period [place]  Status New   PT LONG TERM GOAL #2   Title Pt will improve TUG score to less than or equal to 13.5 seconds for decreased fall risk.   Time 4   Period Weeks   Status New   PT LONG TERM GOAL #3   Title Pt will improve gait velocity to at least 2.62 ft/sec for imrpoved gait efficiency and safety with gait.   Time 4   Period Weeks   Status New   PT LONG TERM GOAL #4   Title Pt will verbalize fall prevention techniques within home environment.   Time 4   Period Weeks   Status New               Plan - 06/26/15 1510    Clinical Impression Statement Reviewed OTAGO, with pt needing frequent redirection to performance of HEP.  Discussed and recommend pt's caregiver come back to therapy visit next time in order for improved carryover of HEP performance at home (pt didn't want to invite caregiver back to session today.)   Pt will benefit from skilled therapeutic intervention in order to improve on the following deficits Abnormal gait;Decreased  balance;Decreased strength;Difficulty walking   Rehab Potential Good   Clinical Impairments Affecting Rehab Potential memory deficits-may need to have caregiver/family member present for HEP instruction   PT Frequency 2x / week   PT Duration 4 weeks  wk 2 of 4   PT Treatment/Interventions Therapeutic activities;Patient/family education;Therapeutic exercise;Gait training;Balance training;Neuromuscular re-education;Stair training;ADLs/Self Care Home Management;Functional mobility training   PT Next Visit Plan add remaining OTAGO exercises (with exception of tandem gait and heel walking backwards); caregiver instruction on HEP; walking program   Consulted and Agree with Plan of Care Patient        Problem List Patient Active Problem List   Diagnosis Date Noted  . Cough 12/28/2014  . Fatigue 12/28/2014  . Essential hypertension 12/28/2014  . Carpal tunnel syndrome 09/14/2014  . Memory difficulty 09/06/2014  . Gait disorder 09/06/2014  . 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 Ovarian cancer 12/15/2011    Frazier Butt. 06/26/2015, 3:14 PM  Frazier Butt., PT  [facility] [address] [place], [place], [address] Phone: [phone]   Fax:  [phone]

## 2015-06-29 ENCOUNTER — Encounter: Payer: Self-pay | Admitting: Physical Therapy

## 2015-06-29 ENCOUNTER — Ambulatory Visit: Payer: Medicare Other | Admitting: Physical Therapy

## 2015-06-29 DIAGNOSIS — R5381 Other malaise: Secondary | ICD-10-CM

## 2015-06-29 DIAGNOSIS — M6281 Muscle weakness (generalized): Secondary | ICD-10-CM

## 2015-06-29 DIAGNOSIS — R279 Unspecified lack of coordination: Secondary | ICD-10-CM

## 2015-06-29 DIAGNOSIS — R269 Unspecified abnormalities of gait and mobility: Secondary | ICD-10-CM

## 2015-06-29 DIAGNOSIS — R278 Other lack of coordination: Secondary | ICD-10-CM

## 2015-06-29 NOTE — Therapy (Signed)
[facility] [address] [place], [place], [address] Phone: [phone]   Fax:  [phone]  Physical Therapy Treatment  Patient Details  Name: Peggy Conner MRN: [id number] Date of Birth: 08-16-1925 Referring Provider:  Golden Circle, FNP  Encounter Date: 06/29/2015      PT End of Session - 06/29/15 1452    Visit Number 4   Number of Visits 9   Date for PT Re-Evaluation 08/20/15   Authorization Type Medicare G-code every 10th visit   PT Start Time 1450   PT Stop Time 1532   PT Time Calculation (min) 42 min   Activity Tolerance Patient tolerated treatment well   Behavior During Therapy [facility] for tasks assessed/performed      Past Medical History  Diagnosis Date  . Hypertension   . Depression   . Ovarian cancer     s/p surgery and chemothearpy  . Memory difficulty 09/06/2014  . Gait disorder 09/06/2014  . HOH (hard of hearing)     hearing aid, history of tinnitus  . Anxiety   . Arthritis of foot   . GERD (gastroesophageal reflux disease)   . Peripheral neuropathy   . Carpal tunnel syndrome 09/14/2014    Bilateral    Past Surgical History  Procedure Laterality Date  . Hip pinning  2009  . Ovarian cancer debulking   2007    There were no vitals filed for this visit.  Visit Diagnosis:  Abnormality of gait  Generalized muscle weakness  Decreased coordination  Debility      Subjective Assessment - 06/29/15 1451    Subjective No new complaints. Did some of her exercises today. [name], her caregiver, is with her today.   Currently in Pain? No/denies           Balance Exercises - 06/29/15 1455    OTAGO PROGRAM   Head Movements Standing;5 reps   Neck Movements Standing;5 reps   Back Extension Standing;5 reps   Trunk Movements Standing;5 reps   Ankle Movements Sitting;10 reps   Knee Extensor 10 reps  red band resistance   Knee Flexor 10 reps   Hip ABductor 10 reps  bil UE support facing  counter   Ankle Plantorflexors 20 reps, support   Ankle Dorsiflexors 20 reps, support   Knee Bends 10 reps, support   Backwards Walking Support   Walking and Turning Around No assistive device  supervision from caregiver   Sideways Walking No assistive device  supervision from caregiver   Tandem Stance 10 seconds, support   Tandem Walk Support   One Leg Stand 10 seconds, support   Heel Walking Support   Toe Walk Support   Sit to Stand 10 reps, bilateral support           PT Education - 06/29/15 1901    Education provided Yes   Education Details educated pt's caregiver on how to cue/assist pt with OTAGO program at home   Person(s) Educated Patient;Caregiver(s)   Methods Explanation;Handout   Comprehension Verbalized understanding;Returned demonstration           PT Long Term Goals - 06/21/15 1432    PT LONG TERM GOAL #1   Title Pt will improve Berg score to at least 44/56 for decreased fall risk.  Target 07/20/15   Time 4   Period Weeks   Status New   PT LONG TERM GOAL #2   Title Pt will improve TUG score to less than or equal to 13.5  seconds for decreased fall risk.   Time 4   Period Weeks   Status New   PT LONG TERM GOAL #3   Title Pt will improve gait velocity to at least 2.62 ft/sec for imrpoved gait efficiency and safety with gait.   Time 4   Period Weeks   Status New   PT LONG TERM GOAL #4   Title Pt will verbalize fall prevention techniques within home environment.   Time 4   Period Weeks   Status New           Plan - 06/29/15 1452    Clinical Impression Statement Pt's caregiver was present today to see how to best cue pt/assist pt with her home exercise program (OTAGO). Added new OTAGO exercises from last session, included heell walking forward only and tandem gait with UE support forward only. Pt making steady progress toward goals.   Pt will benefit from skilled therapeutic intervention in order to improve on the following deficits Abnormal  gait;Decreased balance;Decreased strength;Difficulty walking   Rehab Potential Good   Clinical Impairments Affecting Rehab Potential memory deficits-may need to have caregiver/family member present for HEP instruction   PT Frequency 2x / week   PT Duration 4 weeks  wk 2 of 4   PT Treatment/Interventions Therapeutic activities;Patient/family education;Therapeutic exercise;Gait training;Balance training;Neuromuscular re-education;Stair training;ADLs/Self Care Home Management;Functional mobility training   PT Next Visit Plan Walking program. Continue with strengthening and balance activities.   Consulted and Agree with Plan of Care Patient;Family member/caregiver   Family Member Consulted [name]        Problem List Patient Active Problem List   Diagnosis Date Noted  . Cough 12/28/2014  . Fatigue 12/28/2014  . Essential hypertension 12/28/2014  . Carpal tunnel syndrome 09/14/2014  . Memory difficulty 09/06/2014  . Gait disorder 09/06/2014  . 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 Ovarian cancer 12/15/2011    Willow Ora 06/29/2015, 7:05 PM  Willow Ora, PTA, [facility] [address], [place], [place] [address] [phone] 06/29/2015, 7:06 PM

## 2015-07-04 ENCOUNTER — Ambulatory Visit: Payer: Medicare Other | Admitting: Physical Therapy

## 2015-07-06 ENCOUNTER — Encounter: Payer: Self-pay | Admitting: Physical Therapy

## 2015-07-06 ENCOUNTER — Ambulatory Visit: Payer: Medicare Other | Attending: Neurology | Admitting: Physical Therapy

## 2015-07-06 DIAGNOSIS — R5381 Other malaise: Secondary | ICD-10-CM | POA: Diagnosis present

## 2015-07-06 DIAGNOSIS — R279 Unspecified lack of coordination: Secondary | ICD-10-CM | POA: Insufficient documentation

## 2015-07-06 DIAGNOSIS — R269 Unspecified abnormalities of gait and mobility: Secondary | ICD-10-CM

## 2015-07-06 DIAGNOSIS — M6281 Muscle weakness (generalized): Secondary | ICD-10-CM | POA: Insufficient documentation

## 2015-07-06 DIAGNOSIS — R278 Other lack of coordination: Secondary | ICD-10-CM

## 2015-07-06 NOTE — Therapy (Signed)
[facility] [address] [place], [place], [address] Phone: [phone]   Fax:  [phone]  Physical Therapy Treatment  Patient Details  Name: Peggy Conner MRN: [id number] Date of Birth: 09/01/1925 Referring Provider:  Golden Circle, FNP  Encounter Date: 07/06/2015   07/06/15 1323  PT Visits / Re-Eval  Visit Number 5 (g5)  Number of Visits 9  Date for PT Re-Evaluation 08/20/15  Authorization  Authorization Type Medicare G-code every 10th visit  PT Time Calculation  PT Start Time 1316  PT Stop Time 1400  PT Time Calculation (min) 44 min  PT - End of Session  Equipment Utilized During Treatment Gait belt  Activity Tolerance Patient tolerated treatment well  Behavior During Therapy [facility] for tasks assessed/performed     Past Medical History  Diagnosis Date  . Hypertension   . Depression   . Ovarian cancer     s/p surgery and chemothearpy  . Memory difficulty 09/06/2014  . Gait disorder 09/06/2014  . HOH (hard of hearing)     hearing aid, history of tinnitus  . Anxiety   . Arthritis of foot   . GERD (gastroesophageal reflux disease)   . Peripheral neuropathy   . Carpal tunnel syndrome 09/14/2014    Bilateral    Past Surgical History  Procedure Laterality Date  . Hip pinning  2009  . Ovarian cancer debulking   2007    There were no vitals filed for this visit.  Visit Diagnosis:  Abnormality of gait  Generalized muscle weakness  Decreased coordination  Debility     07/06/15 1322  Symptoms/Limitations  Subjective No new complaints. No falls or pain to report. Has been doing her HEP (OTAGO) with [name] at home, no issues to report.   Pain Assessment  Currently in Pain? No/denies      07/06/15 1324  Transfers  Sit to Stand 6: Modified independent (Device/Increase time);With upper extremity assist;With armrests;From chair/3-in-1  Stand to Sit 6: Modified independent (Device/Increase time);With  upper extremity assist;With armrests;To chair/3-in-1  Ambulation/Gait  Ambulation/Gait Yes  Ambulation/Gait Assistance 5: Supervision;4: Min guard  Ambulation/Gait Assistance Details cues to use cane with gait vs carring it, cues on posture as well. occasional toe scuffing noted with gait on outdoor paved surfaces with no balance loss.  Ambulation Distance (Feet) 700 Feet (or more)  Assistive device Straight cane  Gait Pattern Step-through pattern;Wide base of support  Ambulation Surface Level;Unlevel;Indoor;Outdoor;Paved     Neuro Re-ed: Red mats next to cabinet - high knee marching, tandem gait, toe walk (1 lap only) and heel walking, all forward/backwards x 3 laps each/each way with min guard to min assist for balance, 1 UE support and cues to task. - stepping over hurdles of different heights x 4 laps with 1 UE support on counter and min assist for balance. Cues on posture, step height and length to clear hurdle.  - 6 cones in a row along edge of mats: aternating toe taps to each one with side stepping left<>right x 1 lap each way with min HHA for balance and cues on posture and weight shifting with activity. - 3 cones down middle of mats: alternating toe taps to each cone with weaving around cones x 2 laps        PT Long Term Goals - 06/21/15 1432    PT LONG TERM GOAL #1   Title Pt will improve Berg score to at least 44/56 for decreased fall risk.  Target 07/20/15  Time 4   Period Weeks   Status New   PT LONG TERM GOAL #2   Title Pt will improve TUG score to less than or equal to 13.5 seconds for decreased fall risk.   Time 4   Period Weeks   Status New   PT LONG TERM GOAL #3   Title Pt will improve gait velocity to at least 2.62 ft/sec for imrpoved gait efficiency and safety with gait.   Time 4   Period Weeks   Status New   PT LONG TERM GOAL #4   Title Pt will verbalize fall prevention techniques within home environment.   Time 4   Period Weeks   Status New         07/06/15 1324  Plan  Clinical Impression Statement Worked on gait and balance activites today with no issues reported. Pt did need redirection to task being performed several times during sessoiin due to internal and external distractions. Pt making steady progress toward goals.  Pt will benefit from skilled therapeutic intervention in order to improve on the following deficits Abnormal gait;Decreased balance;Decreased strength;Difficulty walking  Rehab Potential Good  Clinical Impairments Affecting Rehab Potential memory deficits-may need to have caregiver/family member present for HEP instruction  PT Frequency 2x / week  PT Duration 4 weeks (wk 2 of 4)  PT Treatment/Interventions Therapeutic activities;Patient/family education;Therapeutic exercise;Gait training;Balance training;Neuromuscular re-education;Stair training;ADLs/Self Care Home Management;Functional mobility training  PT Next Visit Plan Continue with strengthening and balance activities.  Consulted and Agree with Plan of Care Patient;Family member/caregiver  Family Member Consulted [name]     Problem List Patient Active Problem List   Diagnosis Date Noted  . Cough 12/28/2014  . Fatigue 12/28/2014  . Essential hypertension 12/28/2014  . Carpal tunnel syndrome 09/14/2014  . Memory difficulty 09/06/2014  . Gait disorder 09/06/2014  . 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 Ovarian cancer 12/15/2011    Willow Ora 07/07/2015, 10:05 PM  Willow Ora, PTA, [facility] [address] [place], [place] [address] [phone] 07/07/2015, 10:05 PM

## 2015-07-09 ENCOUNTER — Ambulatory Visit: Payer: Medicare Other | Admitting: Physical Therapy

## 2015-07-10 ENCOUNTER — Ambulatory Visit: Payer: Medicare Other | Admitting: Physical Therapy

## 2015-07-10 ENCOUNTER — Encounter: Payer: Self-pay | Admitting: Physical Therapy

## 2015-07-10 DIAGNOSIS — R269 Unspecified abnormalities of gait and mobility: Secondary | ICD-10-CM

## 2015-07-10 DIAGNOSIS — R279 Unspecified lack of coordination: Secondary | ICD-10-CM

## 2015-07-10 DIAGNOSIS — R278 Other lack of coordination: Secondary | ICD-10-CM

## 2015-07-10 DIAGNOSIS — M6281 Muscle weakness (generalized): Secondary | ICD-10-CM

## 2015-07-10 DIAGNOSIS — R5381 Other malaise: Secondary | ICD-10-CM

## 2015-07-10 NOTE — Therapy (Signed)
[facility] [address] [place], [place], [address] Phone: [phone]   Fax:  [phone]  Physical Therapy Treatment  Patient Details  Name: Peggy Conner MRN: [id number] Date of Birth: 20-May-1925 Referring Provider:  Golden Circle, FNP  Encounter Date: 07/10/2015      PT End of Session - 07/10/15 1237    Visit Number 6  g6   Number of Visits 9   Date for PT Re-Evaluation 08/20/15   Authorization Type Medicare G-code every 10th visit   PT Start Time 1231   PT Stop Time 1310   PT Time Calculation (min) 39 min   Equipment Utilized During Treatment Gait belt   Activity Tolerance Patient tolerated treatment well   Behavior During Therapy [facility] for tasks assessed/performed      Past Medical History  Diagnosis Date  . Hypertension   . Depression   . Ovarian cancer     s/p surgery and chemothearpy  . Memory difficulty 09/06/2014  . Gait disorder 09/06/2014  . HOH (hard of hearing)     hearing aid, history of tinnitus  . Anxiety   . Arthritis of foot   . GERD (gastroesophageal reflux disease)   . Peripheral neuropathy   . Carpal tunnel syndrome 09/14/2014    Bilateral    Past Surgical History  Procedure Laterality Date  . Hip pinning  2009  . Ovarian cancer debulking   2007    There were no vitals filed for this visit.  Visit Diagnosis:  Abnormality of gait  Generalized muscle weakness  Decreased coordination  Debility      Subjective Assessment - 07/10/15 1236    Subjective No new complaints. No falls or pain to report. Tired after last session.   Currently in Pain? No/denies      Treatment: Gait: 420 feet x 2, no AD, supervision. Cues for increased step length/height with gait.   Neuro Re-ed: On both red mats - high knee marching, toe walking, heel walking x 3 laps each/each way with up to min assist. Cues on posture and ex form/techique.  single leg stance activities with tall cones: -  alternating fwd toe taps, cross toe taps, fwd double toe taps, cross double toe taps, and flip over/up x 10 reps each bil legs. Min guard assist to min assist for balance. Cues on posture, weight shifting and stance stability with activity.        PT Long Term Goals - 06/21/15 1432    PT LONG TERM GOAL #1   Title Pt will improve Berg score to at least 44/56 for decreased fall risk.  Target 07/20/15   Time 4   Period Weeks   Status New   PT LONG TERM GOAL #2   Title Pt will improve TUG score to less than or equal to 13.5 seconds for decreased fall risk.   Time 4   Period Weeks   Status New   PT LONG TERM GOAL #3   Title Pt will improve gait velocity to at least 2.62 ft/sec for imrpoved gait efficiency and safety with gait.   Time 4   Period Weeks   Status New   PT LONG TERM GOAL #4   Title Pt will verbalize fall prevention techniques within home environment.   Time 4   Period Weeks   Status New           Plan - 07/10/15 1237    Clinical Impression Statement Continued to work on  gait and balance activites. Occasional need for redirection to task after rest breaks taken. Making steady progress toward goals.   Pt will benefit from skilled therapeutic intervention in order to improve on the following deficits Abnormal gait;Decreased balance;Decreased strength;Difficulty walking   Rehab Potential Good   Clinical Impairments Affecting Rehab Potential memory deficits-may need to have caregiver/family member present for HEP instruction   PT Frequency 2x / week   PT Duration 4 weeks  wk 2 of 4   PT Treatment/Interventions Therapeutic activities;Patient/family education;Therapeutic exercise;Gait training;Balance training;Neuromuscular re-education;Stair training;ADLs/Self Care Home Management;Functional mobility training   PT Next Visit Plan  Continue with strengthening and balance activities.   Consulted and Agree with Plan of Care Patient;Family member/caregiver   Family Member  Consulted pt's daughter        Problem List Patient Active Problem List   Diagnosis Date Noted  . Cough 12/28/2014  . Fatigue 12/28/2014  . Essential hypertension 12/28/2014  . Carpal tunnel syndrome 09/14/2014  . Memory difficulty 09/06/2014  . Gait disorder 09/06/2014  . 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 Ovarian cancer 12/15/2011    Willow Ora 07/11/2015, 11:34 AM  Willow Ora, PTA, [facility] [address] [place], [place] [address] [phone] 07/11/2015, 11:34 AM

## 2015-07-13 ENCOUNTER — Encounter: Payer: Self-pay | Admitting: Physical Therapy

## 2015-07-13 ENCOUNTER — Ambulatory Visit: Payer: Medicare Other | Admitting: Physical Therapy

## 2015-07-13 DIAGNOSIS — R279 Unspecified lack of coordination: Secondary | ICD-10-CM

## 2015-07-13 DIAGNOSIS — M6281 Muscle weakness (generalized): Secondary | ICD-10-CM

## 2015-07-13 DIAGNOSIS — R269 Unspecified abnormalities of gait and mobility: Secondary | ICD-10-CM | POA: Diagnosis not present

## 2015-07-13 DIAGNOSIS — R5381 Other malaise: Secondary | ICD-10-CM

## 2015-07-13 DIAGNOSIS — R278 Other lack of coordination: Secondary | ICD-10-CM

## 2015-07-13 NOTE — Therapy (Signed)
[facility] [address] [place], [place], [address] Phone: [phone]   Fax:  [phone]  Physical Therapy Treatment  Patient Details  Name: Peggy Conner MRN: [id number] Date of Birth: 09/17/1925 Referring Provider:  Golden Circle, FNP  Encounter Date: 07/13/2015    07/13/15 1106  PT Visits / Re-Eval  Visit Number 7 (g7)  Number of Visits 9  Date for PT Re-Evaluation 08/20/15  Authorization  Authorization Type Medicare G-code every 10th visit  PT Time Calculation  PT Start Time 1104  PT Stop Time 1145  PT Time Calculation (min) 41 min  PT - End of Session  Equipment Utilized During Treatment Gait belt  Activity Tolerance Patient tolerated treatment well  Behavior During Therapy [facility] for tasks assessed/performed    Past Medical History  Diagnosis Date  . Hypertension   . Depression   . Ovarian cancer     s/p surgery and chemothearpy  . Memory difficulty 09/06/2014  . Gait disorder 09/06/2014  . HOH (hard of hearing)     hearing aid, history of tinnitus  . Anxiety   . Arthritis of foot   . GERD (gastroesophageal reflux disease)   . Peripheral neuropathy   . Carpal tunnel syndrome 09/14/2014    Bilateral    Past Surgical History  Procedure Laterality Date  . Hip pinning  2009  . Ovarian cancer debulking   2007    There were no vitals filed for this visit.  Visit Diagnosis:  Abnormality of gait  Generalized muscle weakness  Decreased coordination  Debility      Subjective Assessment - 07/13/15 1105    Subjective No new complaints. No falls or pain to report. Reports feeling sleepy today.   Currently in Pain? No/denies          [facility] Adult PT Treatment/Exercise - 07/13/15 1107    Ambulation/Gait   Ambulation/Gait Yes   Ambulation/Gait Assistance 5: Supervision;6: Modified independent (Device/Increase time)   Ambulation Distance (Feet) 700 Feet  or more   Assistive device Straight  cane   Gait Pattern Step-through pattern;Wide base of support   Ambulation Surface Level;Unlevel;Indoor;Outdoor;Paved;Gravel;Grass   Gait velocity 13.56 sec= 2.42 ft/sec with cane     Neuro Re-ed: Blue/red mats next to counter with 1 UE support and min guard to min assist for balance. Cues on correct form and technique - high knee marching fwd/bwd, toe walk fwd/bwd, heel walk fwd/bwd and tandem gait fwd/bwd. All 3 laps each /each way.   Blue mat only Single leg stance activities with tall cones - alternating fwd toe taps, cross toe taps, fwd double toe taps, cross double toe taps, and flip over/up x 10 each bil legs. Cues on posture, weight shifting and to ensure stance stability before lifting other leg.               PT Long Term Goals - 07/13/15 1117    PT LONG TERM GOAL #1   Title Pt will improve Berg score to at least 44/56 for decreased fall risk.  Target 07/20/15   Time 4   Period Weeks   Status New   PT LONG TERM GOAL #2   Title Pt will improve TUG score to less than or equal to 13.5 seconds for decreased fall risk.   Time 4   Period Weeks   Status New   PT LONG TERM GOAL #3   Title Pt will improve gait velocity to at least 2.62 ft/sec for imrpoved  gait efficiency and safety with gait.   Time 4   Period Weeks   Status New   PT LONG TERM GOAL #4   Title Pt will verbalize fall prevention techniques within home environment.   Time 4   Period Weeks   Status New        07/13/15 1106  Plan  Clinical Impression Statement Pt continues to make steady progress toward goals.   Pt will benefit from skilled therapeutic intervention in order to improve on the following deficits Abnormal gait;Decreased balance;Decreased strength;Difficulty walking  Rehab Potential Good  Clinical Impairments Affecting Rehab Potential memory deficits-may need to have caregiver/family member present for HEP instruction  PT Frequency 2x / week  PT Duration 4 weeks (wk 2 of 4)  PT  Treatment/Interventions Therapeutic activities;Patient/family education;Therapeutic exercise;Gait training;Balance training;Neuromuscular re-education;Stair training;ADLs/Self Care Home Management;Functional mobility training  PT Next Visit Plan assess LTG's  Consulted and Agree with Plan of Care Patient;Family member/caregiver  Family Member Consulted [name], pt's caregiver       Problem List Patient Active Problem List   Diagnosis Date Noted  . Cough 12/28/2014  . Fatigue 12/28/2014  . Essential hypertension 12/28/2014  . Carpal tunnel syndrome 09/14/2014  . Memory difficulty 09/06/2014  . Gait disorder 09/06/2014  . 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 Ovarian cancer 12/15/2011    Willow Ora 07/13/2015, 11:19 AM  Willow Ora, PTA, [facility] [address], [place], [place] [address] [phone] 07/13/2015, 11:19 AM

## 2015-07-18 ENCOUNTER — Ambulatory Visit: Payer: Medicare Other | Admitting: Physical Therapy

## 2015-07-18 DIAGNOSIS — R269 Unspecified abnormalities of gait and mobility: Secondary | ICD-10-CM

## 2015-07-18 NOTE — Patient Instructions (Signed)

## 2015-07-18 NOTE — Therapy (Signed)
[facility] [address] [place], [place], [address] Phone: [phone]   Fax:  [phone]  Physical Therapy Treatment  Patient Details  Name: Peggy Conner MRN: [id number] Date of Birth: 03-14-1925 Referring Provider:  Golden Circle, FNP  Encounter Date: 07/18/2015      PT End of Session - 07/18/15 2307    Visit Number 8   Number of Visits 9   Date for PT Re-Evaluation 08/20/15   Authorization Type Medicare G-code every 10th visit   PT Start Time 1106   PT Stop Time 1145   PT Time Calculation (min) 39 min   Activity Tolerance Patient tolerated treatment well   Behavior During Therapy [facility] for tasks assessed/performed      Past Medical History  Diagnosis Date  . Hypertension   . Depression   . Ovarian cancer     s/p surgery and chemothearpy  . Memory difficulty 09/06/2014  . Gait disorder 09/06/2014  . HOH (hard of hearing)     hearing aid, history of tinnitus  . Anxiety   . Arthritis of foot   . GERD (gastroesophageal reflux disease)   . Peripheral neuropathy   . Carpal tunnel syndrome 09/14/2014    Bilateral    Past Surgical History  Procedure Laterality Date  . Hip pinning  2009  . Ovarian cancer debulking   2007    There were no vitals filed for this visit.  Visit Diagnosis:  Abnormality of gait      Subjective Assessment - 07/18/15 1108    Subjective Did not sleep one bit last night.  Denies falls.   Currently in Pain? No/denies                         [facility] Adult PT Treatment/Exercise - 07/18/15 1116    Ambulation/Gait   Ambulation/Gait Yes   Ambulation/Gait Assistance 5: Supervision   Ambulation Distance (Feet) 100 Feet  x 2   Assistive device Straight cane   Gait Pattern Step-through pattern;Wide base of support   Ambulation Surface Level;Indoor   Gait velocity 12.86 sec = 2.55 ft/sec   Standardized Balance Assessment   Standardized Balance Assessment Berg  Balance Test;Timed Up and Go Test   Berg Balance Test   Sit to Stand Able to stand  independently using hands   Standing Unsupported Able to stand safely 2 minutes   Sitting with Back Unsupported but Feet Supported on Floor or Stool Able to sit safely and securely 2 minutes   Stand to Sit Controls descent by using hands   Transfers Able to transfer safely, definite need of hands   Standing Unsupported with Eyes Closed Able to stand 10 seconds safely   Standing Ubsupported with Feet Together Able to place feet together independently and stand 1 minute safely   From Standing, Reach Forward with Outstretched Arm Can reach forward >12 cm safely (5")   From Standing Position, Pick up Object from Floor Able to pick up shoe safely and easily   From Standing Position, Turn to Look Behind Over each Shoulder Turn sideways only but maintains balance   Turn 360 Degrees Able to turn 360 degrees safely but slowly   Standing Unsupported, Alternately Place Feet on Step/Stool Able to complete >2 steps/needs minimal assist   Standing Unsupported, One Foot in Front Able to take small step independently and hold 30 seconds   Standing on One Leg Tries to lift leg/unable to hold 3  seconds but remains standing independently   Total Score 40   Timed Up and Go Test   TUG Normal TUG   Normal TUG (seconds) 17.21                PT Education - 07/18/15 2306    Education provided Yes   Education Details Discussed progress with goals; plans for discharge next visit; community fitness options   Person(s) Educated Patient;Caregiver(s)   Methods Explanation;Handout   Comprehension Verbalized understanding             PT Long Term Goals - 07/18/15 1130    PT LONG TERM GOAL #1   Title Pt will improve Berg score to at least 44/56 for decreased fall risk.  Target 07/20/15   Baseline 07/18/15 Berg 40/56, improved from 44/56   Status Not Met   PT LONG TERM GOAL #2   Title Pt will improve TUG score to less  than or equal to 13.5 seconds for decreased fall risk.   Baseline 07/18/15 TUG 17.21 sec   Status Not Met   PT LONG TERM GOAL #3   Title Pt will improve gait velocity to at least 2.62 ft/sec for imrpoved gait efficiency and safety with gait.   Baseline 07/18/15 gait velocity 2.55 ft/sec   Status Not Met               Plan - 07/18/15 2308    Clinical Impression Statement Pt did not meet LTG #1, 2, 3 for Berg, TUG and gait velocity.  Pt has had no falls and caregiver is assisting with OTAGO program.  Discussed continued community fitness and plan to discuss fall prevention prior to discharge next visit.   Pt will benefit from skilled therapeutic intervention in order to improve on the following deficits Abnormal gait;Decreased balance;Decreased strength;Difficulty walking   Rehab Potential Good   Clinical Impairments Affecting Rehab Potential memory deficits-may need to have caregiver/family member present for HEP instruction   PT Frequency 2x / week   PT Duration 4 weeks   PT Treatment/Interventions Therapeutic activities;Patient/family education;Therapeutic exercise;Gait training;Balance training;Neuromuscular re-education;Stair training;ADLs/Self Care Home Management;Functional mobility training   PT Next Visit Plan Check remaining LTGs and discharge next visit   Consulted and Agree with Plan of Care Patient;Family member/caregiver   Family Member Consulted caregiver        Problem List Patient Active Problem List   Diagnosis Date Noted  . Cough 12/28/2014  . Fatigue 12/28/2014  . Essential hypertension 12/28/2014  . Carpal tunnel syndrome 09/14/2014  . Memory difficulty 09/06/2014  . Gait disorder 09/06/2014  . 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 Ovarian cancer 12/15/2011    Frazier Butt. 07/18/2015, 11:12 PM  Frazier Butt., PT  [facility] [address] [place], [place], [address] Phone: [phone]   Fax:   [phone]

## 2015-07-20 ENCOUNTER — Ambulatory Visit: Payer: Medicare Other | Admitting: Physical Therapy

## 2015-08-01 ENCOUNTER — Ambulatory Visit: Payer: Medicare Other | Admitting: Physical Therapy

## 2015-08-01 DIAGNOSIS — R269 Unspecified abnormalities of gait and mobility: Secondary | ICD-10-CM

## 2015-08-01 NOTE — Therapy (Signed)
[facility] [address] [place], [place], [address] Phone: [phone]   Fax:  [phone]  Physical Therapy Treatment  Patient Details  Name: Peggy Conner MRN: [id number] Date of Birth: 03/09/1925 Referring Provider:  Golden Circle, FNP  Encounter Date: 08/01/2015      PT End of Session - 08/01/15 1616    Visit Number 9   Number of Visits 9   Authorization Type Medicare G-code every 10th visit   PT Start Time 1533   PT Stop Time 1608   PT Time Calculation (min) 35 min   Equipment Utilized During Treatment Gait belt   Activity Tolerance Patient limited by fatigue  O2 sats 89%>95%   Behavior During Therapy [facility] for tasks assessed/performed      Past Medical History  Diagnosis Date  . Hypertension   . Depression   . Ovarian cancer     s/p surgery and chemothearpy  . Memory difficulty 09/06/2014  . Gait disorder 09/06/2014  . HOH (hard of hearing)     hearing aid, history of tinnitus  . Anxiety   . Arthritis of foot   . GERD (gastroesophageal reflux disease)   . Peripheral neuropathy   . Carpal tunnel syndrome 09/14/2014    Bilateral    Past Surgical History  Procedure Laterality Date  . Hip pinning  2009  . Ovarian cancer debulking   2007    There were no vitals filed for this visit.  Visit Diagnosis:  Abnormality of gait      Subjective Assessment - 08/01/15 1534    Subjective Just don't feel all that great today.  Daughter present-reports some good days and some not so good.   Currently in Pain? No/denies                        Strengthening portion of OTAGO performed with added 2# weight.        Balance Exercises - 08/01/15 1614    OTAGO PROGRAM   Ankle Movements Sitting;10 reps  2#   Knee Extensor 10 reps;Weight (comment)  2#   Knee Flexor 10 reps;Weight (comment)  2#   Hip ABductor 10 reps;Weight (comment)  2#   Ankle Plantorflexors 20 reps, support  10  reps-2#   Ankle Dorsiflexors 20 reps, support  10 reps-2#    Walking for exercise 2:30, for 350 ft, needing to sit due to fatigue.  Discussed walking program starting at 2 minutes around the house, several times a day with caregiver.       PT Education - 08/01/15 1615    Education provided Yes   Education Details HEP review/discussion of addition of weights to [employer]; walking program, community fitness and fall prevention   Person(s) Educated Patient;Child(ren)   Methods Explanation;Demonstration;Handout   Comprehension Verbalized understanding             PT Long Term Goals - 08/01/15 1618    PT LONG TERM GOAL #1   Title Pt will improve Berg score to at least 44/56 for decreased fall risk.  Target 07/20/15   Baseline 07/18/15 Berg 40/56, improved from 39/56   Status Not Met   PT LONG TERM GOAL #2   Title Pt will improve TUG score to less than or equal to 13.5 seconds for decreased fall risk.   Baseline 07/18/15 TUG 17.21 sec   Status Not Met   PT LONG TERM GOAL #3   Title Pt will improve  gait velocity to at least 2.62 ft/sec for imrpoved gait efficiency and safety with gait.   Baseline 07/18/15 gait velocity 2.55 ft/sec   Status Not Met   PT LONG TERM GOAL #4   Title Pt will verbalize fall prevention techniques within home environment.   Status Achieved               Plan - 08-02-2015 1620    Clinical Impression Statement Pt met LTG#4 for education on fall prevention.  Daughter present at session today, and verbalizes frustration that patient is not motivated for participation in exercise.  Discussed and reiterated with patient the importance of consistent exercise and physical activitiy on a regular basis.  Pt is being discharged today due to not meeting her goals.   Pt will benefit from skilled therapeutic intervention in order to improve on the following deficits Abnormal gait;Decreased balance;Decreased strength;Difficulty walking   Rehab Potential Good    Clinical Impairments Affecting Rehab Potential memory deficits-may need to have caregiver/family member present for HEP instruction   PT Frequency 2x / week   PT Duration 4 weeks   PT Treatment/Interventions Therapeutic activities;Patient/family education;Therapeutic exercise;Gait training;Balance training;Neuromuscular re-education;Stair training;ADLs/Self Care Home Management;Functional mobility training   PT Next Visit Plan Discharge today   Consulted and Agree with Plan of Care Patient;Family member/caregiver   Family Member Consulted daughter          G-Codes - Aug 02, 2015 Jan 17, 1632    Functional Assessment Tool Used 17.21 sec TUG, Berg 40/56, gait velocity 2.55 ft/sec   Functional Limitation Mobility: Walking and moving around   Mobility: Walking and Moving Around Goal Status [phone]) At least 1 percent but less than 20 percent impaired, limited or restricted   Mobility: Walking and Moving Around Discharge Status [phone]) At least 20 percent but less than 40 percent impaired, limited or restricted      Problem List Patient Active Problem List   Diagnosis Date Noted  . Cough 12/28/2014  . Fatigue 12/28/2014  . Essential hypertension 12/28/2014  . Carpal tunnel syndrome 09/14/2014  . Memory difficulty 09/06/2014  . Gait disorder 09/06/2014  . 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 Ovarian cancer 12/15/2011    Frazier Butt. 08-02-15, 4:34 PM  [facility] [address] [place], [place], [address] Phone: [phone]   Fax:  [phone]   PHYSICAL THERAPY DISCHARGE SUMMARY  Visits from Start of Care: 9  Current functional level related to goals / functional outcomes: See goals above-pt did not meet LTG #1-3.  LTG #4 met.   Remaining deficits: Balance, strength, gait-pt reports mostly sitting and lying down on couch at home.   Education / Equipment: HEP, fall prevention, community fitness and walking program.  Pt's  daughter present for last visit and verbalizes understanding of the information presented; however, she also voices frustration over patient's unwillingness to participate in exercise and physical activity.  Plan: Patient agrees to discharge.  Patient goals were not met. Patient is being discharged due to the physician's request.  ?????       Frazier Butt., PT  Mady Haagensen, PT 08-02-2015 4:38 PM Phone: [phone] Fax: [phone]

## 2015-08-01 NOTE — Patient Instructions (Addendum)
Walking ideas: -[place] -both have fellowship halls that are open for walking at certain times  It is important to avoid accidents which may result in broken bones.  Here are a few ideas on how to make your home safer so you will be less likely to trip or fall.  1. Use nonskid mats or non slip strips in your shower or tub, on your bathroom floor and around sinks.  If you know that you have spilled water, wipe it up! 2. In the bathroom, it is important to have properly installed grab bars on the walls or on the edge of the tub.  Towel racks are NOT strong enough for you to hold onto or to pull on for support. 3. Stairs and hallways should have enough light.  Add lamps or night lights if you need ore light. 4. It is good to have handrails on both sides of the stairs if possible.  Always fix broken handrails right away. 5. It is important to see the edges of steps.  Paint the edges of outdoor steps white so you can see them better.  Put colored tape on the edge of inside steps. 6. Throw-rugs are dangerous because they can slide.  Removing the rugs is the best idea, but if they must stay, add adhesive carpet tape to prevent slipping. 7. Do not keep things on stairs or in the halls.  Remove small furniture that blocks the halls as it may cause you to trip.  Keep telephone and electrical cords out of the way where you walk. 8. Always were sturdy, rubber-soled shoes for good support.  Never wear just socks, especially on the stairs.  Socks may cause you to slip or fall.  Do not wear full-length housecoats as you can easily trip on the bottom.  9. Place the things you use the most on the shelves that are the easiest to reach.  If you use a stepstool, make sure it is in good condition.  If you feel unsteady, DO NOT climb, ask for help. If a health professional advises you to use a cane or walker, do not be ashamed.  These items can keep you from falling  and breaking your bones. Community Occupational psychologist of Services Cost  A Matter of Balance Class locations vary. Call [place] on Aging for more information.  http://dawson-may.com/ 252-499-7343 8-Session program addressing the fear of falling and increasing activity levels of older adults Free to minimal cost  A.C.T. By [employer] [address], [place], [place] [address].  BetaBlues.dk [phone]  Personal training, gym, classes including Silver Sneakers* and ACTion for Aging Adults Fee-based  A.H.O.Y. (Add Health to [place]) Airs on Time [employer] 13, M-F at [place]: [employer],  [employer] [place],  [place], [place] [facility], [address] [facility], [place], [place], [place] [address]  [place] Location: Sharrell Ku. [employer] [place] [employer]      [phone]  [phone]  [phone]  [phone]  [phone]  [phone]  [phone]  [phone]  [phone]  [phone]    [phone] A total-body conditioning class for adults 32 and older;  designed to increase muscular strength, endurance, range of movement, flexibility, balance, agility and coordination Free  [facility], [place] [address] [address]      [address]      [phone]      Pilate's class for individualsreturning to exercise after an injury, before or after surgery or for individuals with complex musculoskeletal issues; designed to improve strength, balance , flexibility       $15/class  [place] [address] [place], [place] [address] [email or web address] [place] classes for beginners to advanced [place], [place] [address] [email or web address] [place] Chair Exercises Free, ages 32 and older; Ages 30-59 fee based  Marvia Pickles, [facility] [address] [place], [place] [address] [email or web address] .Beverlee Nims [phone]  A.H.O.Y. Tai Chi Fee-based Donation based or free  [place] Class locations vary.  Call or email Angela Burke or view website for more information. [email or web address] [email or web address] [phone] Ongoing classes at local [employer]MC[employer] and gyms Fee-based  Silver Sneakers A.C.T. By [employer]: [place] [employer] [place] [phone]  [phone] Classes designed for older adults who want to improve their strength, flexibility, balance and endurance.   Silver sneakers is covered by some insurance plans and includes a fitness center membership at participating locations. Find out more by calling [phone] or visiting [email or web address] Covered by some insurance plans  [facility] [place] [phone] A.H.O.Y., fitness room, personal training, fitness classes for injury prevention, strength, balance, flexibility, water fitness classes Ages 55+: $40 for 6 months; Ages 47-54: $40 for 6 months   Tai Chi for Everybody [facility] [address] [place], [place] [address] [email or web address] Sears [phone] Tai Chi classes for beginners to advanced; geared for seniors Donation Based      [employer] (Helpling Others Participate in Exercise     Loyal Gambler. Rosana Hoes, PhD, [place] [email or web address] [place]     [phone]     A comprehensive fitness program for adults.  The program paris senior-level undergraduates Kinesiology students with adults who desire to learn how to exercise safely.  Includes a structural exercise class focusing on functional fitnesss     $100/semester in fall and spring; $75 in summer (no trainers)    *Silver Sneakers is covered by some Personal assistant and includes a  Radio producer at participating locations.  Find out more by calling [phone] or visiting [email or web address]  For additional health and human services resources for senior adults, please contact SeniorLine at [phone] in [place] and [place] at [phone] in all other areas.

## 2015-08-01 NOTE — Addendum Note (Signed)
Addended by: Frazier Butt on: 08/01/2015 04:32 PM   Modules accepted: Orders

## 2015-08-15 ENCOUNTER — Ambulatory Visit: Payer: Medicare Other | Admitting: Family

## 2015-08-16 ENCOUNTER — Encounter: Payer: Self-pay | Admitting: Family

## 2015-08-16 ENCOUNTER — Ambulatory Visit (INDEPENDENT_AMBULATORY_CARE_PROVIDER_SITE_OTHER): Payer: Medicare Other | Admitting: Family

## 2015-08-16 VITALS — BP 164/94 | HR 87 | Temp 98.5°F | Resp 18 | Ht 64.0 in | Wt 155.8 lb

## 2015-08-16 DIAGNOSIS — R4589 Other symptoms and signs involving emotional state: Secondary | ICD-10-CM | POA: Insufficient documentation

## 2015-08-16 DIAGNOSIS — F329 Major depressive disorder, single episode, unspecified: Secondary | ICD-10-CM | POA: Diagnosis not present

## 2015-08-16 DIAGNOSIS — Z23 Encounter for immunization: Secondary | ICD-10-CM | POA: Diagnosis not present

## 2015-08-16 DIAGNOSIS — R05 Cough: Secondary | ICD-10-CM | POA: Diagnosis not present

## 2015-08-16 DIAGNOSIS — R059 Cough, unspecified: Secondary | ICD-10-CM

## 2015-08-16 MED ORDER — AZITHROMYCIN 250 MG PO TABS: ORAL_TABLET | ORAL | Status: DC

## 2015-08-16 MED ORDER — VILAZODONE HCL 20 MG PO TABS: 20.0000 mg | ORAL_TABLET | Freq: Every day | ORAL | Status: DC

## 2015-08-16 MED ORDER — VILAZODONE HCL 10 MG PO TABS: 10.0000 mg | ORAL_TABLET | Freq: Every day | ORAL | Status: DC

## 2015-08-16 NOTE — Patient Instructions (Signed)
Thank you for choosing [facility].  Summary/Instructions:  Your prescription(s) have been submitted to your pharmacy or been printed and provided for you. Please take as directed and contact our office if you believe you are having problem(s) with the medication(s) or have any questions.  If your symptoms worsen or fail to improve, please contact our office for further instruction, or in case of emergency go directly to the emergency room at the closest medical facility.     

## 2015-08-16 NOTE — Progress Notes (Signed)
Pre visit review using our clinic review tool, if applicable. No additional management support is needed unless otherwise documented below in the visit note. 

## 2015-08-16 NOTE — Assessment & Plan Note (Signed)
Symptoms consistent with depressed mood possibly related to her memory issues. Discussed risks and benefits of medication. Start trial of Vilazodone. Follow up in 1 month or sooner if depressed mood or thoughts of suicide develop.

## 2015-08-16 NOTE — Assessment & Plan Note (Signed)
Symptoms and exam consistent with underlying allergic rhinitis, however cannot rule out infection given time of symptoms. Start azithromycin. Continue over the counter medications as needed for symptom relief and supportive care. Follow up if symptoms worsen or fail to improve.

## 2015-08-16 NOTE — Progress Notes (Signed)
Subjective:    Patient ID: Peggy Conner, female    DOB: 1925/05/16, 81 y.o.   MRN: [id number]  Chief Complaint  Patient presents with  . Cough    has had a really bad cough, drainage, the cough gets bad at night, memory issues are worse    HPI:  Peggy Conner is a 81 y.o. female with a PMH of hypertension, carpal tunnel syndrome, ovarian cancer, memory difficulty, fatigue who presents today for an acute office visit.  1.) Cough -  This is a new problem. Associated symptoms of cough, drainage and overall not feeling well has been going on for the last couple of weeks. Modifying factors include loratadine which has helped with her symptoms. She also takes robutussin and Delysm which does help with the cough. Timing of the cough is worse at night.   2.) Depressed Mood - Associated symptom of decreased interest in doing things has been going on for several months. Daughter reports that she does not feel like going out and lays around and sleeps a lot. Notes the dementia has progressively worsened and she does not wish to take her Aricept.    Allergies  Allergen Reactions  . Pregabalin Other (See Comments)    Current Outpatient Prescriptions on File Prior to Visit  Medication Sig Dispense Refill  . aspirin 81 MG tablet Take 81 mg by mouth daily.    . diclofenac sodium (VOLTAREN) 1 % GEL Apply 2 g topically 4 (four) times daily. 100 g 0  . DULoxetine (CYMBALTA) 30 MG capsule TAKE ONE CAPSULE BY MOUTH EVERY DAY 30 capsule 6  . gabapentin (NEURONTIN) 300 MG capsule Take 300 mg by mouth at bedtime.    Marland Kitchen losartan (COZAAR) 100 MG tablet Take 50 mg by mouth daily. 1/2 tab daily    . Melatonin 5 MG TABS Take by mouth daily.    . ranitidine (ZANTAC) 150 MG capsule Take 150 mg by mouth as needed.     . vitamin B-12 (CYANOCOBALAMIN) 1000 MCG tablet Take 1,000 mcg by mouth daily.     No current facility-administered medications on file prior to visit.    Review of Systems  Constitutional: Negative for  fever and chills.  HENT: Positive for congestion. Negative for sneezing.   Eyes: Positive for itching.  Respiratory: Positive for cough.   Psychiatric/Behavioral: Positive for dysphoric mood. Negative for suicidal ideas.      Objective:    BP 164/94 mmHg  Pulse 87  Temp(Src) 98.5 F (36.9 C) (Oral)  Resp 18  Ht 5\' 4"  (1.626 m)  Wt 155 lb 12.8 oz (70.67 kg)  BMI 26.73 kg/m2  SpO2 98% Nursing note and vital signs reviewed.  Physical Exam  Constitutional: She is oriented to person, place, and time. She appears well-developed and well-nourished. No distress.  HENT:  Right Ear: Hearing, tympanic membrane, external ear and ear canal normal.  Left Ear: Hearing, tympanic membrane, external ear and ear canal normal.  Nose: Right sinus exhibits no maxillary sinus tenderness and no frontal sinus tenderness. Left sinus exhibits no maxillary sinus tenderness and no frontal sinus tenderness.  Mouth/Throat: Uvula is midline and oropharynx is clear and moist.  Cardiovascular: Normal rate, regular rhythm, normal heart sounds and intact distal pulses.   Pulmonary/Chest: Effort normal and breath sounds normal.  Neurological: She is alert and oriented to person, place, and time.  Skin: Skin is warm and dry.  Psychiatric: Her behavior is normal. Judgment and thought content normal. She exhibits a  depressed mood.       Assessment & Plan:   Problem List Items Addressed This Visit      Other   Cough    Symptoms and exam consistent with underlying allergic rhinitis, however cannot rule out infection given time of symptoms. Start azithromycin. Continue over the counter medications as needed for symptom relief and supportive care. Follow up if symptoms worsen or fail to improve.       Relevant Medications   azithromycin (ZITHROMAX) 250 MG tablet   Depressed mood    Symptoms consistent with depressed mood possibly related to her memory issues. Discussed risks and benefits of medication. Start  trial of Vilazodone. Follow up in 1 month or sooner if depressed mood or thoughts of suicide develop.       Relevant Medications   Vilazodone HCl (VIIBRYD) 10 MG TABS   Vilazodone HCl (VIIBRYD) 20 MG TABS    Other Visit Diagnoses    Encounter for immunization    -  Primary

## 2015-08-20 ENCOUNTER — Encounter: Payer: Self-pay | Admitting: Family

## 2015-08-21 ENCOUNTER — Other Ambulatory Visit: Payer: Self-pay | Admitting: Family

## 2015-08-21 MED ORDER — BENZONATATE 100 MG PO CAPS: 100.0000 mg | ORAL_CAPSULE | Freq: Two times a day (BID) | ORAL | Status: DC | PRN

## 2015-08-21 MED ORDER — BUPROPION HCL ER (XL) 150 MG PO TB24: 150.0000 mg | ORAL_TABLET | Freq: Every day | ORAL | Status: DC

## 2015-09-15 ENCOUNTER — Ambulatory Visit (INDEPENDENT_AMBULATORY_CARE_PROVIDER_SITE_OTHER): Payer: Medicare Other | Admitting: Family Medicine

## 2015-09-15 ENCOUNTER — Encounter: Payer: Self-pay | Admitting: Family Medicine

## 2015-09-15 VITALS — BP 120/70 | HR 106 | Temp 97.9°F | Ht 64.0 in | Wt 154.0 lb

## 2015-09-15 DIAGNOSIS — R059 Cough, unspecified: Secondary | ICD-10-CM

## 2015-09-15 DIAGNOSIS — R05 Cough: Secondary | ICD-10-CM | POA: Diagnosis not present

## 2015-09-15 MED ORDER — AZITHROMYCIN 250 MG PO TABS: ORAL_TABLET | ORAL | Status: DC

## 2015-09-15 NOTE — Progress Notes (Signed)
Pre visit review using our clinic review tool, if applicable. No additional management support is needed unless otherwise documented below in the visit note. 

## 2015-09-15 NOTE — Assessment & Plan Note (Signed)
Discussed anticipate cough more related to seasonal allergic rhinitis/drainage than true infection, however given age and endorsed worsening productive cough over last week with associated coughing fits and post tussive emesis, will cover for atypical infection with zpack.  Recommended switch claritin to allegra, try OTC dimetapp/delsym. Update if not improved with treatment. She cannot use nasal saline or intranasal steroids due to hand arthritis.

## 2015-09-15 NOTE — Patient Instructions (Addendum)
Change claritin to allegra.  zpack for cough.  Try over the counter delsym or dimetapp for cough. May try ibuprofen 400mg  with meal for bronchial inflammation. Let us know how you do with this.

## 2015-09-15 NOTE — Progress Notes (Signed)
BP 120/70 mmHg  Pulse 106  Temp(Src) 97.9 F (36.6 C) (Oral)  Ht 5\' 4"  (1.626 m)  Wt 154 lb (69.854 kg)  BMI 26.42 kg/m2  SpO2 97%   CC: "i've coughed myself to death"  Subjective:    Patient ID: Peggy Conner, female    DOB: 09/29/1925, 81 y.o.   MRN: [id number]  HPI: Peggy Conner is a 81 y.o. female presenting on 09/15/2015 for Cough   Presents with caregiver - has memory loss problems.  1 wk h/o cough that has worsened. Coughing up clear phlegm. Ribcage soreness from cough. Endorses mild wheezing. + congestion and PNDrainage (chronic issue but worsening). Endorses chronic cough from drainage. Coughing fits to point of gagging.   She and caregiver also endorse that she has had chronic cough presumed stemming from allergies and claritin has been ineffective.   She did have zpack prescribed 08/16/2015 and it was filled by pharmacy but pt doesn't remember taking this or if it was helpful - having memory trouble.   No fevers/chills, ear or tooth pain, abd pain, headaches.  No dyspnea.  Has tried robitussin, tessalon perls, cough drops (mild improvement).   No sick contacts at home. No smokers at home. No h/o asthma/COPD.   Relevant past medical, surgical, family and social history reviewed and updated as indicated. Interim medical history since our last visit reviewed. Allergies and medications reviewed and updated. Current Outpatient Prescriptions on File Prior to Visit  Medication Sig  . aspirin 81 MG tablet Take 81 mg by mouth daily.  . benzonatate (TESSALON) 100 MG capsule Take 1 capsule (100 mg total) by mouth 2 (two) times daily as needed for cough.  Marland Kitchen buPROPion (WELLBUTRIN XL) 150 MG 24 hr tablet Take 1 tablet (150 mg total) by mouth daily.  . diclofenac sodium (VOLTAREN) 1 % GEL Apply 2 g topically 4 (four) times daily.  . DULoxetine (CYMBALTA) 30 MG capsule TAKE ONE CAPSULE BY MOUTH EVERY DAY  . gabapentin (NEURONTIN) 300 MG capsule Take 300 mg by mouth at bedtime.  Marland Kitchen  losartan (COZAAR) 100 MG tablet TAKE 1/2 TABLET ONCE A DAY  . Melatonin 5 MG TABS Take by mouth daily.  . ranitidine (ZANTAC) 150 MG capsule Take 150 mg by mouth as needed.   . vitamin B-12 (CYANOCOBALAMIN) 1000 MCG tablet Take 1,000 mcg by mouth daily.   No current facility-administered medications on file prior to visit.    Review of Systems Per HPI unless specifically indicated above     Objective:    BP 120/70 mmHg  Pulse 106  Temp(Src) 97.9 F (36.6 C) (Oral)  Ht 5\' 4"  (1.626 m)  Wt 154 lb (69.854 kg)  BMI 26.42 kg/m2  SpO2 97%  Wt Readings from Last 3 Encounters:  09/15/15 154 lb (69.854 kg)  08/16/15 155 lb 12.8 oz (70.67 kg)  05/10/15 155 lb (70.308 kg)    Physical Exam  Constitutional: She appears well-developed and well-nourished. No distress.  HENT:  Head: Normocephalic and atraumatic.  Right Ear: Hearing, tympanic membrane, external ear and ear canal normal.  Left Ear: Hearing, tympanic membrane, external ear and ear canal normal.  Nose: Rhinorrhea present. No mucosal edema. Right sinus exhibits no maxillary sinus tenderness and no frontal sinus tenderness. Left sinus exhibits no maxillary sinus tenderness and no frontal sinus tenderness.  Mouth/Throat: Uvula is midline, oropharynx is clear and moist and mucous membranes are normal. No oropharyngeal exudate, posterior oropharyngeal edema, posterior oropharyngeal erythema or tonsillar abscesses.  Nasal mucosal  inflammation present and mild injection  Eyes: Conjunctivae and EOM are normal. Pupils are equal, round, and reactive to light. No scleral icterus.  Neck: Normal range of motion. Neck supple.  Cardiovascular: Normal rate, regular rhythm, normal heart sounds and intact distal pulses.   No murmur heard. Pulmonary/Chest: Effort normal and breath sounds normal. No respiratory distress. She has no wheezes. She has no rales.  Lymphadenopathy:    She has no cervical adenopathy.  Skin: Skin is warm and dry. No rash  noted.  Nursing note and vitals reviewed.     Assessment & Plan:   Problem List Items Addressed This Visit    Cough - Primary    Discussed anticipate cough more related to seasonal allergic rhinitis/drainage than true infection, however given age and endorsed worsening productive cough over last week with associated coughing fits and post tussive emesis, will cover for atypical infection with zpack.  Recommended switch claritin to allegra, try OTC dimetapp/delsym. Update if not improved with treatment. She cannot use nasal saline or intranasal steroids due to hand arthritis.          Follow up plan: Return if symptoms worsen or fail to improve.

## 2015-09-17 ENCOUNTER — Ambulatory Visit: Payer: Medicare Other | Admitting: Family

## 2015-09-22 ENCOUNTER — Other Ambulatory Visit: Payer: Self-pay | Admitting: Family

## 2015-10-12 ENCOUNTER — Telehealth: Payer: Self-pay | Admitting: Family

## 2015-10-12 NOTE — Telephone Encounter (Signed)
I have not ordered any tests for her and am not seeing any results. What test did she have done?

## 2015-10-12 NOTE — Telephone Encounter (Signed)
Pt called in and had some test done today an and they wanted her to call and talking to someone today about their findings  Best number [phone]

## 2015-10-12 NOTE — Telephone Encounter (Signed)
Please advise on tests that were done.

## 2015-10-31 ENCOUNTER — Other Ambulatory Visit: Payer: Self-pay | Admitting: Family

## 2015-11-02 MED ORDER — BENZONATATE 100 MG PO CAPS: 100.0000 mg | ORAL_CAPSULE | Freq: Two times a day (BID) | ORAL | Status: DC | PRN

## 2015-11-02 NOTE — Addendum Note (Signed)
Addended by: Mauricio Po D on: 11/02/2015 05:55 PM   Modules accepted: Orders

## 2015-11-14 ENCOUNTER — Ambulatory Visit (INDEPENDENT_AMBULATORY_CARE_PROVIDER_SITE_OTHER): Payer: Medicare Other | Admitting: Nurse Practitioner

## 2015-11-14 ENCOUNTER — Encounter: Payer: Self-pay | Admitting: Nurse Practitioner

## 2015-11-14 VITALS — BP 111/72 | HR 102 | Ht 64.0 in | Wt 155.4 lb

## 2015-11-14 DIAGNOSIS — R413 Other amnesia: Secondary | ICD-10-CM

## 2015-11-14 DIAGNOSIS — R05 Cough: Secondary | ICD-10-CM

## 2015-11-14 DIAGNOSIS — G622 Polyneuropathy due to other toxic agents: Secondary | ICD-10-CM | POA: Diagnosis not present

## 2015-11-14 DIAGNOSIS — R059 Cough, unspecified: Secondary | ICD-10-CM

## 2015-11-14 MED ORDER — DONEPEZIL HCL 10 MG PO TABS: 10.0000 mg | ORAL_TABLET | Freq: Every day | ORAL | Status: DC

## 2015-11-14 NOTE — Patient Instructions (Signed)
Restart Aricept 5 mg in the morning to lessen side effects for one month After 1 month increase Aricept to 10 mg Rx to daughter Create a safe environment, remove locks on bathroom  Doors Reduced confusion, keep familiar objects and people around, stick to a routine Use effective communication such as simple words and short sentences Reduce nighttime restlessness, a consistent nighttime routine,  avoid napping during the day Encourage good nutrition and hydration Seek medical care for acute worsening confusion or fever, this usually indicates infection Perform exercises from home exercise program Follow up in 6 months

## 2015-11-14 NOTE — Progress Notes (Signed)
I have read the note, and I agree with the clinical assessment and plan.  WILLIS,CHARLES KEITH   

## 2015-11-14 NOTE — Progress Notes (Signed)
[facility]  PATIENT: Peggy Conner DOB: 04-Feb-1925   REASON FOR VISIT: Follow-up for memory disorder, gait disorder, and cerebral vascular disease HISTORY FROM: Patient and daughter    HISTORY OF PRESENT ILLNESS:Peggy Conner is an 81 year old right-handed white female with a history of a memory disturbance that is progressive. 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 The patient has had ongoing progression of her memory issues over time. She was last seen in this office by Dr. Jannifer Franklin 05/10/2015. The patient lives alone, but her daughter has taken over her finances, she is no longer operating a motor vehicle, and she is getting aid and assistance 6 days a week. Caregivers assist with bathing and dressing however she can feed herself .The patient is becoming socially withdrawn, she does not feel like getting out of the house much. The patient does have some balance issues, she has fallen on occasion but no recent falls.. She uses a cane for ambulation. The patient is on Cymbalta for her peripheral neuropathy discomfort. She takes 30 mg daily of this. There is some question whether there may be some underlying depression. She has been placed on Viibryd since last seen by her primary care provider. She was placed on Aricept by Dr. Jannifer Franklin however she is currently not taking medication but wants to restart it. The patient comes to this office for an evaluation.   REVIEW OF SYSTEMS: Full 14 system review of systems performed and notable only for those listed, all others are neg:  Constitutional: neg  Cardiovascular: neg Ear/Nose/Throat: neg  Skin: neg Eyes: neg Respiratory: neg Gastroitestinal: neg  Hematology/Lymphatic: neg  Endocrine: neg Musculoskeletal: Walking difficulty Allergy/Immunology: neg Neurological: Memory loss Psychiatric: neg Sleep :  neg   ALLERGIES: Allergies  Allergen Reactions  . Pregabalin Other (See Comments)    HOME MEDICATIONS: Outpatient Prescriptions Prior to Visit  Medication Sig Dispense Refill  . aspirin 81 MG tablet Take 81 mg by mouth daily.    Marland Kitchen azithromycin (ZITHROMAX) 250 MG tablet Take two tablets on day one followed by one tablet on days 2-5 6 each 0  . benzonatate (TESSALON) 100 MG capsule Take 1 capsule (100 mg total) by mouth 2 (two) times daily as needed for cough. 30 capsule 0  . buPROPion (WELLBUTRIN XL) 150 MG 24 hr tablet Take 1 tablet (150 mg total) by mouth daily. 30 tablet 0  . diclofenac sodium (VOLTAREN) 1 % GEL Apply 2 g topically 4 (four) times daily. 100 g 0  . DULoxetine (CYMBALTA) 30 MG capsule TAKE ONE CAPSULE BY MOUTH EVERY DAY 30 capsule 6  . gabapentin (NEURONTIN) 300 MG capsule Take 300 mg by mouth at bedtime.    Marland Kitchen losartan (COZAAR) 100 MG tablet TAKE 1/2 TABLET ONCE A DAY 45 tablet 3  . Melatonin 5 MG TABS Take by mouth daily.    . ranitidine (ZANTAC) 150 MG capsule Take 150 mg by mouth as needed.     Marland Kitchen VIIBRYD 20 MG TABS TAKE 1 TABLET (20 MG TOTAL) BY MOUTH DAILY. 30 tablet 0  . vitamin B-12 (CYANOCOBALAMIN) 1000 MCG tablet Take 1,000 mcg by mouth daily.     No facility-administered medications prior to visit.    PAST MEDICAL HISTORY: Past Medical History  Diagnosis Date  . Hypertension   . Depression   . Ovarian cancer [facility])     s/p surgery and chemothearpy  .  Memory difficulty 09/06/2014  . Gait disorder 09/06/2014  . HOH (hard of hearing)     hearing aid, history of tinnitus  . Anxiety   . Arthritis of foot   . GERD (gastroesophageal reflux disease)   . Peripheral neuropathy ([place])   . Carpal tunnel syndrome 09/14/2014    Bilateral    PAST SURGICAL HISTORY: Past Surgical History  Procedure Laterality Date  . Hip pinning  2009  . Ovarian cancer debulking   2007    FAMILY HISTORY: Family History  Problem Relation Age of Onset  . Lung cancer Brother    . Heart attack Father   . Cancer Brother   . Lung cancer Brother     SOCIAL HISTORY: Social History   Social History  . Marital Status: Widowed    Spouse Name: N/A  . Number of Children: 2  . Years of Education: 12   Occupational History  . Retired    Social History Main Topics  . Smoking status: Never Smoker   . Smokeless tobacco: Never Used  . Alcohol Use: No     Comment: occasional  . Drug Use: No  . Sexual Activity: Not on file   Other Topics Concern  . Not on file   Social History Narrative   Patient is married.    Patient has 2 children   Patient is retired.    Fun: Play golf, watch golf;    Patient drinks 3 cups of caffeine daily.   Patient is right handed.     PHYSICAL EXAM  Filed Vitals:   11/14/15 1403  BP: 111/72  Pulse: 102  Height: 5\' 4"  (1.626 m)  Weight: 155 lb 6.4 oz (70.489 kg)   Body mass index is 26.66 kg/(m^2). General: The patient is alert and cooperative at the time of the examination. Skin: No significant peripheral edema is noted. Neurologic Exam Mental status: The patient is not oriented to date year and season or place.  The Mini-Mental Status Examination done today shows a total score of 21/30 Last  28/30. The patient is able to name 4 animals in 30 seconds.Clock drawing 3/4.  Cranial nerves: Facial symmetry is present. Speech is normal, no aphasia or dysarthria is noted. Extraocular movements are full. Visual fields are full. Motor: The patient has good strength in all 4 extremities. Sensory examination: Soft touch sensation is symmetric on the face, arms, and legs. Coordination: The patient has good finger-nose-finger and heel-to-shin bilaterally. Gait and station: The patient has a normal gait. Tandem gait is slightly unsteady. Romberg is negative. No drift is seen. She ambulates with a single-point cane Reflexes: Deep tendon reflexes are symmetric upper and lower.   DIAGNOSTIC DATA (LABS, IMAGING, TESTING) - I reviewed  patient records, labs, notes, testing and imaging myself where available.  Lab Results  Component Value Date   WBC 6.8 12/28/2014   HGB 13.1 12/28/2014   HCT 38.7 12/28/2014   MCV 87.9 12/28/2014   PLT 387.0 12/28/2014    ASSESSMENT AND PLAN  81 y.o. year old female  has a past medical history of Hypertension; Depression;  Memory difficulty (09/06/2014); Gait disorder (09/06/2014); HOH (hard of hearing); Anxiety;  Peripheral neuropathy ([place]); and cerebrovascular disease here to follow-up.  PLAN Restart Aricept 5 mg in the morning to lessen side effects for one month After 1 month increase Aricept to 10 mg Rx to daughter Create a safe environment, remove locks on bathroom  Doors Reduce confusion, keep familiar objects and people around, stick  to a routine Use effective communication such as simple words and short sentences Reduce nighttime restlessness, a consistent nighttime routine,  avoid napping during the day Encourage good nutrition and hydration Seek medical care for acute worsening confusion or fever, this usually indicates infection Perform exercises from home exercise program Follow up in 6 monthsVst time 30 min Dennie Bible, [facility], [facility], [facility] [address] [place], [place] [address] [phone]

## 2016-01-03 ENCOUNTER — Other Ambulatory Visit: Payer: Self-pay | Admitting: Family

## 2016-01-03 MED ORDER — BENZONATATE 100 MG PO CAPS: 100.0000 mg | ORAL_CAPSULE | Freq: Two times a day (BID) | ORAL | Status: DC | PRN

## 2016-01-03 NOTE — Addendum Note (Signed)
Addended by: Mauricio Po D on: 01/03/2016 12:32 PM   Modules accepted: Orders

## 2016-01-15 ENCOUNTER — Other Ambulatory Visit: Payer: Self-pay | Admitting: Family

## 2016-02-14 ENCOUNTER — Other Ambulatory Visit: Payer: Self-pay | Admitting: Family

## 2016-02-21 ENCOUNTER — Other Ambulatory Visit: Payer: Self-pay | Admitting: Neurology

## 2016-03-25 ENCOUNTER — Ambulatory Visit (INDEPENDENT_AMBULATORY_CARE_PROVIDER_SITE_OTHER): Payer: Medicare Other | Admitting: Family

## 2016-03-25 ENCOUNTER — Encounter: Payer: Self-pay | Admitting: Family

## 2016-03-25 VITALS — BP 130/88 | HR 87 | Temp 98.0°F | Resp 16 | Ht 64.0 in | Wt 151.0 lb

## 2016-03-25 DIAGNOSIS — R05 Cough: Secondary | ICD-10-CM | POA: Diagnosis not present

## 2016-03-25 DIAGNOSIS — R059 Cough, unspecified: Secondary | ICD-10-CM

## 2016-03-25 MED ORDER — LEVOFLOXACIN 500 MG PO TABS: 500.0000 mg | ORAL_TABLET | Freq: Every day | ORAL | Status: DC

## 2016-03-25 MED ORDER — FLUTICASONE FUROATE-VILANTEROL 100-25 MCG/INH IN AEPB: 1.0000 | INHALATION_SPRAY | Freq: Every day | RESPIRATORY_TRACT | Status: DC

## 2016-03-25 MED ORDER — BENZONATATE 100 MG PO CAPS: 100.0000 mg | ORAL_CAPSULE | Freq: Two times a day (BID) | ORAL | Status: DC | PRN

## 2016-03-25 NOTE — Progress Notes (Signed)
Subjective:    Patient ID: Peggy Conner, female    DOB: Sep 29, 1925, 82 y.o.   MRN: [id number]  Chief Complaint  Patient presents with  . Cough    x2 weeks has had a cough and has had more of a productive cough over the past week    HPI:  Peggy Conner is a 82 y.o. female who  has a past medical history of Hypertension; Depression; Ovarian cancer ([place]); Memory difficulty (09/06/2014); Gait disorder (09/06/2014); HOH (hard of hearing); Anxiety; Arthritis of foot; GERD (gastroesophageal reflux disease); Peripheral neuropathy ([place]); and Carpal tunnel syndrome (09/14/2014). and presents today For an acute office visit.  This is a new problem. Associated symptoms of cough and congestion have been going on for approximately 2 weeks with increasing cough and worsening of symptoms over the past week. Denies fevers. Severity of the cough results in some chest discomfort.and shortness of breath on occasion. Modifying factors include OTC cough syrup, Tessalon, and allergy medicine which have not helped very much. No recent antibiotic use.   Allergies  Allergen Reactions  . Pregabalin Other (See Comments)     Current Outpatient Prescriptions on File Prior to Visit  Medication Sig Dispense Refill  . aspirin 81 MG tablet Take 81 mg by mouth daily.    Marland Kitchen buPROPion (WELLBUTRIN XL) 150 MG 24 hr tablet TAKE ONE TABLET BY MOUTH ONCE DAILY 30 tablet 5  . diclofenac sodium (VOLTAREN) 1 % GEL Apply 2 g topically 4 (four) times daily. (Patient taking differently: Apply 2 g topically 4 (four) times daily as needed. ) 100 g 0  . donepezil (ARICEPT) 10 MG tablet Take 1 tablet (10 mg total) by mouth daily. 30 tablet 6  . DULoxetine (CYMBALTA) 30 MG capsule TAKE ONE CAPSULE BY MOUTH ONCE DAILY 30 capsule 3  . gabapentin (NEURONTIN) 300 MG capsule Take 300 mg by mouth at bedtime.    Marland Kitchen losartan (COZAAR) 100 MG tablet TAKE 1/2 TABLET ONCE A DAY 45 tablet 3  . Melatonin 5 MG TABS Take by mouth daily as needed. Reported on  11/14/2015    . ranitidine (ZANTAC) 150 MG capsule Take 150 mg by mouth as needed.     . vitamin B-12 (CYANOCOBALAMIN) 1000 MCG tablet Take 1,000 mcg by mouth daily.     No current facility-administered medications on file prior to visit.     Past Surgical History  Procedure Laterality Date  . Hip pinning  2009  . Ovarian cancer debulking   2007    Past Medical History  Diagnosis Date  . Hypertension   . Depression   . Ovarian cancer [facility])     s/p surgery and chemothearpy  . Memory difficulty 09/06/2014  . Gait disorder 09/06/2014  . HOH (hard of hearing)     hearing aid, history of tinnitus  . Anxiety   . Arthritis of foot   . GERD (gastroesophageal reflux disease)   . Peripheral neuropathy ([place])   . Carpal tunnel syndrome 09/14/2014    Bilateral    Review of Systems  Constitutional: Negative for fever and chills.  HENT: Positive for congestion. Negative for sore throat.   Respiratory: Positive for cough and shortness of breath.   Neurological: Positive for headaches.      Objective:    BP 130/88 mmHg  Pulse 87  Temp(Src) 98 F (36.7 C) (Oral)  Resp 16  Ht 5\' 4"  (1.626 m)  Wt 151 lb (68.493 kg)  BMI 25.91 kg/m2  SpO2 95% Nursing  note and vital signs reviewed.  Physical Exam  Constitutional: She is oriented to person, place, and time. She appears well-developed and well-nourished. No distress.  HENT:  Right Ear: Hearing, tympanic membrane, external ear and ear canal normal.  Left Ear: Hearing, tympanic membrane, external ear and ear canal normal.  Nose: Nose normal.  Mouth/Throat: Uvula is midline, oropharynx is clear and moist and mucous membranes are normal.  Cardiovascular: Normal rate, regular rhythm, normal heart sounds and intact distal pulses.   Pulmonary/Chest: Effort normal. She has wheezes.  Neurological: She is alert and oriented to person, place, and time.  Skin: Skin is warm and dry.  Psychiatric: She has a normal mood and affect. Her behavior  is normal. Judgment and thought content normal.       Assessment & Plan:   Problem List Items Addressed This Visit      Other   Cough - Primary    Worsening cough and exam consistent with acute bronchitis. Start levofloxacin, Tessalon and Breo. Continue over the counter medications as needed for symptom relief and supportive care. Follow up if symptoms worsen or do not improve.       Relevant Medications   benzonatate (TESSALON) 100 MG capsule   levofloxacin (LEVAQUIN) 500 MG tablet   fluticasone furoate-vilanterol (BREO ELLIPTA) 100-25 MCG/INH AEPB       I have discontinued [name] VIIBRYD and VIIBRYD. I am also having her start on levofloxacin and fluticasone furoate-vilanterol. Additionally, I am having her maintain her ranitidine, Melatonin, aspirin, gabapentin, vitamin B-12, diclofenac sodium, losartan, donepezil, buPROPion, DULoxetine, Vilazodone HCl, and benzonatate.   Meds ordered this encounter  Medications  . Vilazodone HCl (VIIBRYD) 10 MG TABS    Sig: Take 10 mg by mouth daily.  . benzonatate (TESSALON) 100 MG capsule    Sig: Take 1 capsule (100 mg total) by mouth 2 (two) times daily as needed for cough.    Dispense:  30 capsule    Refill:  0    Order Specific Question:  Supervising Provider    Answer:  Pricilla Holm A [id number]  . levofloxacin (LEVAQUIN) 500 MG tablet    Sig: Take 1 tablet (500 mg total) by mouth daily.    Dispense:  7 tablet    Refill:  0    Order Specific Question:  Supervising Provider    Answer:  Pricilla Holm A [id number]  . fluticasone furoate-vilanterol (BREO ELLIPTA) 100-25 MCG/INH AEPB    Sig: Inhale 1 puff into the lungs daily.    Dispense:  14 each    Refill:  0    Order Specific Question:  Supervising Provider    Answer:  Pricilla Holm A [id number]     Follow-up: Return if symptoms worsen or fail to improve.  Mauricio Po, FNP

## 2016-03-25 NOTE — Progress Notes (Signed)
Pre visit review using our clinic review tool, if applicable. No additional management support is needed unless otherwise documented below in the visit note. 

## 2016-03-25 NOTE — Patient Instructions (Signed)
Thank you for choosing [facility].  Summary/Instructions:  Please continue to take your medications as prescribed.   Breo 1x per day for the next couple of days and then as needed daily.  Your prescription(s) have been submitted to your pharmacy or been printed and provided for you. Please take as directed and contact our office if you believe you are having problem(s) with the medication(s) or have any questions.  If your symptoms worsen or fail to improve, please contact our office for further instruction, or in case of emergency go directly to the emergency room at the closest medical facility.   Acute Bronchitis Bronchitis is inflammation of the airways that extend from the windpipe into the lungs (bronchi). The inflammation often causes mucus to develop. This leads to a cough, which is the most common symptom of bronchitis.  In acute bronchitis, the condition usually develops suddenly and goes away over time, usually in a couple weeks. Smoking, allergies, and asthma can make bronchitis worse. Repeated episodes of bronchitis may cause further lung problems.  CAUSES Acute bronchitis is most often caused by the same virus that causes a cold. The virus can spread from person to person (contagious) through coughing, sneezing, and touching contaminated objects. SIGNS AND SYMPTOMS   Cough.   Fever.   Coughing up mucus.   Body aches.   Chest congestion.   Chills.   Shortness of breath.   Sore throat.  DIAGNOSIS  Acute bronchitis is usually diagnosed through a physical exam. Your health care provider will also ask you questions about your medical history. Tests, such as chest X-rays, are sometimes done to rule out other conditions.  TREATMENT  Acute bronchitis usually goes away in a couple weeks. Oftentimes, no medical treatment is necessary. Medicines are sometimes given for relief of fever or cough. Antibiotic medicines are usually not needed but may be prescribed  in certain situations. In some cases, an inhaler may be recommended to help reduce shortness of breath and control the cough. A cool mist vaporizer may also be used to help thin bronchial secretions and make it easier to clear the chest.  HOME CARE INSTRUCTIONS  Get plenty of rest.   Drink enough fluids to keep your urine clear or pale yellow (unless you have a medical condition that requires fluid restriction). Increasing fluids may help thin your respiratory secretions (sputum) and reduce chest congestion, and it will prevent dehydration.   Take medicines only as directed by your health care provider.  If you were prescribed an antibiotic medicine, finish it all even if you start to feel better.  Avoid smoking and secondhand smoke. Exposure to cigarette smoke or irritating chemicals will make bronchitis worse. If you are a smoker, consider using nicotine gum or skin patches to help control withdrawal symptoms. Quitting smoking will help your lungs heal faster.   Reduce the chances of another bout of acute bronchitis by washing your hands frequently, avoiding people with cold symptoms, and trying not to touch your hands to your mouth, nose, or eyes.   Keep all follow-up visits as directed by your health care provider.  SEEK MEDICAL CARE IF: Your symptoms do not improve after 1 week of treatment.  SEEK IMMEDIATE MEDICAL CARE IF:  You develop an increased fever or chills.   You have chest pain.   You have severe shortness of breath.  You have bloody sputum.   You develop dehydration.  You faint or repeatedly feel like you are going to pass out.  You develop repeated vomiting.  You develop a severe headache. MAKE SURE YOU:   Understand these instructions.  Will watch your condition.  Will get help right away if you are not doing well or get worse.   This information is not intended to replace advice given to you by your health care provider. Make sure you discuss any  questions you have with your health care provider.   Document Released: 12/25/2004 Document Revised: 12/08/2014 Document Reviewed: 05/10/2013 Elsevier Interactive Patient Education [employer].

## 2016-03-25 NOTE — Assessment & Plan Note (Signed)
Worsening cough and exam consistent with acute bronchitis. Start levofloxacin, Tessalon and Breo. Continue over the counter medications as needed for symptom relief and supportive care. Follow up if symptoms worsen or do not improve.

## 2016-05-07 ENCOUNTER — Ambulatory Visit (INDEPENDENT_AMBULATORY_CARE_PROVIDER_SITE_OTHER): Payer: Medicare Other | Admitting: Nurse Practitioner

## 2016-05-07 ENCOUNTER — Encounter: Payer: Self-pay | Admitting: Nurse Practitioner

## 2016-05-07 VITALS — BP 128/80 | HR 80 | Ht 64.0 in | Wt 151.8 lb

## 2016-05-07 DIAGNOSIS — R269 Unspecified abnormalities of gait and mobility: Secondary | ICD-10-CM

## 2016-05-07 DIAGNOSIS — G622 Polyneuropathy due to other toxic agents: Secondary | ICD-10-CM | POA: Diagnosis not present

## 2016-05-07 DIAGNOSIS — R413 Other amnesia: Secondary | ICD-10-CM | POA: Diagnosis not present

## 2016-05-07 MED ORDER — DONEPEZIL HCL 10 MG PO TABS: 10.0000 mg | ORAL_TABLET | Freq: Every day | ORAL | Status: DC

## 2016-05-07 NOTE — Patient Instructions (Signed)
Continue Aricept to 10 mg will refill Perform exercises from home exercise program Follow up in 6 months next with Dr. Jannifer Franklin

## 2016-05-07 NOTE — Progress Notes (Signed)
I have read the note, and I agree with the clinical assessment and plan.  Meryl Ponder KEITH   

## 2016-05-07 NOTE — Progress Notes (Signed)
[facility]  PATIENT: Peggy Conner DOB: 1925-07-07   REASON FOR VISIT follow-up for memory loss toxic neuropathy HISTORY FROM: Patient    HISTORY OF PRESENT ILLNESS:Peggy Conner is an 82 year old right-handed white female with a history of a memory disturbance that is progressive. 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 The patient has had ongoing progression of her memory issues over time. She was last seen in this office 11/14/15  The patient lives alone, but her daughter has taken over her finances, she is no longer operating a motor vehicle, and she is getting aid and assistance 6 days a week. Caregivers assist with bathing and dressing however she can feed herself .The patient is becoming socially withdrawn, she does not feel like getting out of the house much. The patient does have some balance issues, she has fallen on occasion but no recent falls.. She uses a cane for ambulation. The patient is on Cymbalta for her peripheral neuropathy discomfort. She takes 30 mg daily of this. There is some question whether there may be some underlying depression. She has been placed on Viibryd since last seen by her primary care provider. Aricept was restarted at her last visit and  she is currently taking  10 mg daily The patient comes to this office for an evaluation.   REVIEW OF SYSTEMS: Full 14 system review of systems performed and notable only for those listed, all others are neg:  Constitutional: neg  Cardiovascular: neg Ear/Nose/Throat: neg  Skin: neg Eyes: neg Respiratory: neg Gastroitestinal: neg  Hematology/Lymphatic: neg  Endocrine: neg Musculoskeletal:neg Allergy/Immunology: neg Neurological: memory loss Psychiatric: neg Sleep : neg   ALLERGIES: Allergies  Allergen Reactions  . Pregabalin Other (See Comments)    HOME MEDICATIONS: Outpatient  Prescriptions Prior to Visit  Medication Sig Dispense Refill  . aspirin 81 MG tablet Take 81 mg by mouth daily.    . benzonatate (TESSALON) 100 MG capsule Take 1 capsule (100 mg total) by mouth 2 (two) times daily as needed for cough. 30 capsule 0  . buPROPion (WELLBUTRIN XL) 150 MG 24 hr tablet TAKE ONE TABLET BY MOUTH ONCE DAILY 30 tablet 5  . diclofenac sodium (VOLTAREN) 1 % GEL Apply 2 g topically 4 (four) times daily. (Patient taking differently: Apply 2 g topically 4 (four) times daily as needed. ) 100 g 0  . donepezil (ARICEPT) 10 MG tablet Take 1 tablet (10 mg total) by mouth daily. 30 tablet 6  . DULoxetine (CYMBALTA) 30 MG capsule TAKE ONE CAPSULE BY MOUTH ONCE DAILY 30 capsule 3  . fluticasone furoate-vilanterol (BREO ELLIPTA) 100-25 MCG/INH AEPB Inhale 1 puff into the lungs daily. (Patient taking differently: Inhale 1 puff into the lungs daily as needed. ) 14 each 0  . gabapentin (NEURONTIN) 300 MG capsule Take 300 mg by mouth at bedtime as needed.     Marland Kitchen losartan (COZAAR) 100 MG tablet TAKE 1/2 TABLET ONCE A DAY 45 tablet 3  . ranitidine (ZANTAC) 150 MG capsule Take 150 mg by mouth as needed.     . vitamin B-12 (CYANOCOBALAMIN) 1000 MCG tablet Take 1,000 mcg by mouth daily.    Marland Kitchen levofloxacin (LEVAQUIN) 500 MG tablet Take 1 tablet (500 mg total) by mouth daily. 7 tablet 0  . Melatonin 5 MG TABS Take by mouth daily as needed. Reported on 11/14/2015    . Vilazodone HCl (VIIBRYD) 10 MG  TABS Take 10 mg by mouth daily.     No facility-administered medications prior to visit.    PAST MEDICAL HISTORY: Past Medical History  Diagnosis Date  . Hypertension   . Depression   . Ovarian cancer [facility])     s/p surgery and chemothearpy  . Memory difficulty 09/06/2014  . Gait disorder 09/06/2014  . HOH (hard of hearing)     hearing aid, history of tinnitus  . Anxiety   . Arthritis of foot   . GERD (gastroesophageal reflux disease)   . Peripheral neuropathy ([place])   . Carpal tunnel syndrome  09/14/2014    Bilateral    PAST SURGICAL HISTORY: Past Surgical History  Procedure Laterality Date  . Hip pinning  2009  . Ovarian cancer debulking   2007    FAMILY HISTORY: Family History  Problem Relation Age of Onset  . Lung cancer Brother   . Heart attack Father   . Cancer Brother   . Lung cancer Brother     SOCIAL HISTORY: Social History   Social History  . Marital Status: Widowed    Spouse Name: N/A  . Number of Children: 2  . Years of Education: 12   Occupational History  . Retired    Social History Main Topics  . Smoking status: Never Smoker   . Smokeless tobacco: Never Used  . Alcohol Use: No     Comment: occasional  . Drug Use: No  . Sexual Activity: Not on file   Other Topics Concern  . Not on file   Social History Narrative   Patient is married.    Patient has 2 children   Patient is retired.    Fun: Play golf, watch golf;    Patient drinks 3 cups of caffeine daily.   Patient is right handed.     PHYSICAL EXAM  Filed Vitals:   05/07/16 1501  BP: 128/80  Pulse: 80  Height: 5\' 4"  (1.626 m)  Weight: 151 lb 12.8 oz (68.856 kg)   Body mass index is 26.04 kg/(m^2). General: The patient is alert and cooperative at the time of the examination. Skin: No significant peripheral edema is noted. Neurologic Exam Mental status: The patient is not oriented to date. The Mini-Mental Status Examination done today shows a total score of 26/30 Last was 21/30 .The patient is able to name 6 animals in 30 seconds.Clock drawing 3/4.  Cranial nerves: Facial symmetry is present. Speech is normal, no aphasia or dysarthria is noted. Extraocular movements are full. Visual fields are full. Motor: The patient has good strength in all 4 extremities. Sensory examination: Soft touch sensation is symmetric on the face, arms, and legs. Coordination: The patient has good finger-nose-finger and heel-to-shin bilaterally. Gait and station: The patient has a normal gait.  Tandem gait is slightly unsteady. Romberg is negative. No drift is seen. She ambulates with a single-point cane Reflexes: Deep tendon reflexes are symmetric upper and lower.  DIAGNOSTIC DATA (LABS, IMAGING, TESTING)   ASSESSMENT AND PLAN 82 y.o. year old female has a past medical history of Hypertension; Depression; Memory difficulty (09/06/2014); Gait disorder (09/06/2014); HOH (hard of hearing); Anxiety; Peripheral neuropathy ([place]); and cerebrovascular disease here to follow-up.  PLAN  Continue Aricept  10 mg will refill Perform exercises from home exercise program Follow up in 6 months next with Dr. Jerline Pain, [facility], [facility], APRN  [facility] [address], [place], [place] [address] [phone]

## 2016-06-23 ENCOUNTER — Other Ambulatory Visit: Payer: Self-pay | Admitting: Neurology

## 2016-08-28 ENCOUNTER — Other Ambulatory Visit (INDEPENDENT_AMBULATORY_CARE_PROVIDER_SITE_OTHER): Payer: Medicare Other

## 2016-08-28 ENCOUNTER — Encounter: Payer: Self-pay | Admitting: Family

## 2016-08-28 ENCOUNTER — Ambulatory Visit (INDEPENDENT_AMBULATORY_CARE_PROVIDER_SITE_OTHER): Payer: Medicare Other | Admitting: Family

## 2016-08-28 VITALS — BP 140/86 | HR 92 | Temp 98.5°F | Resp 14 | Ht 64.0 in | Wt 142.0 lb

## 2016-08-28 DIAGNOSIS — Z23 Encounter for immunization: Secondary | ICD-10-CM | POA: Diagnosis not present

## 2016-08-28 DIAGNOSIS — R05 Cough: Secondary | ICD-10-CM

## 2016-08-28 DIAGNOSIS — R059 Cough, unspecified: Secondary | ICD-10-CM

## 2016-08-28 DIAGNOSIS — F329 Major depressive disorder, single episode, unspecified: Secondary | ICD-10-CM

## 2016-08-28 DIAGNOSIS — R4589 Other symptoms and signs involving emotional state: Secondary | ICD-10-CM

## 2016-08-28 DIAGNOSIS — I10 Essential (primary) hypertension: Secondary | ICD-10-CM

## 2016-08-28 LAB — CBC
HEMATOCRIT: 39 % (ref 36.0–46.0)
HEMOGLOBIN: 13.1 g/dL (ref 12.0–15.0)
MCHC: 33.7 g/dL (ref 30.0–36.0)
MCV: 85.6 fl (ref 78.0–100.0)
Platelets: 422 10*3/uL — ABNORMAL HIGH (ref 150.0–400.0)
RBC: 4.55 Mil/uL (ref 3.87–5.11)
RDW: 17.2 % — AB (ref 11.5–15.5)
WBC: 6.3 10*3/uL (ref 4.0–10.5)

## 2016-08-28 MED ORDER — BUPROPION HCL ER (XL) 150 MG PO TB24: 150.0000 mg | ORAL_TABLET | Freq: Every day | ORAL | 1 refills | Status: DC

## 2016-08-28 MED ORDER — MONTELUKAST SODIUM 10 MG PO TABS: 10.0000 mg | ORAL_TABLET | Freq: Every day | ORAL | 0 refills | Status: DC

## 2016-08-28 MED ORDER — BENZONATATE 100 MG PO CAPS: 100.0000 mg | ORAL_CAPSULE | Freq: Two times a day (BID) | ORAL | 2 refills | Status: DC | PRN

## 2016-08-28 MED ORDER — GABAPENTIN 300 MG PO CAPS: 300.0000 mg | ORAL_CAPSULE | Freq: Every evening | ORAL | 0 refills | Status: DC | PRN

## 2016-08-28 MED ORDER — LOSARTAN POTASSIUM 100 MG PO TABS
ORAL_TABLET | ORAL | 3 refills | Status: AC
Start: 2016-08-28 — End: ?

## 2016-08-28 NOTE — Patient Instructions (Addendum)
Thank you for choosing [facility].  SUMMARY AND INSTRUCTIONS:  Medication:  Please continue to take your medications as prescribed.   If your blood pressure remains elevated please increase the losartan to 100 mg daily.   Start the Singulair nightly.   Your prescription(s) have been submitted to your pharmacy or been printed and provided for you. Please take as directed and contact our office if you believe you are having problem(s) with the medication(s) or have any questions.  Labs:  Please stop by the lab on the lower level of the building for your blood work. Your results will be released to [facility] (or called to you) after review, usually within 72 hours after test completion. If any changes need to be made, you will be notified at that same time.  1.) The lab is open from 7:30am to 5:30 pm Monday-Friday 2.) No appointment is necessary 3.) Fasting (if needed) is 6-8 hours after food and drink; black coffee and water are okay   Imaging / Radiology:  Please stop by radiology on the basement level of the building for your x-rays. Your results will be released to [facility] (or called to you) after review, usually within 72 hours after test completion. If any treatments or changes are necessary, you will be notified at that same time.  Follow up:  If your symptoms worsen or fail to improve, please contact our office for further instruction, or in case of emergency go directly to the emergency room at the closest medical facility.

## 2016-08-28 NOTE — Assessment & Plan Note (Signed)
Continues to experience associated symptom of cough with concern for possible drainage/seasonal allergies. Recommend Allegra without pseudoephedrine. Start Singulair. Continue current dosage of Flonase. Follow-up pending trial of new medication.

## 2016-08-28 NOTE — Progress Notes (Signed)
Subjective:    Patient ID: Peggy Conner, female    DOB: Jun 20, 1925, 82 y.o.   MRN: [id number]  Chief Complaint  Patient presents with  . Follow-up    medication refills     HPI:  Peggy Conner is a 82 y.o. female who  has a past medical history of Anxiety; Arthritis of foot; Carpal tunnel syndrome (09/14/2014); Depression; Gait disorder (09/06/2014); GERD (gastroesophageal reflux disease); HOH (hard of hearing); Hypertension; Memory difficulty (09/06/2014); Ovarian cancer ([place]); and Peripheral neuropathy ([place]). and presents today for a follow up office visit.   1.) Depression - Currently maintained on Cymbalta which she is also on for neuropathy and also Wellbutrin. Reports taking the medication as prescribed and denies adverse side effects. Mood appears to be improved with the current medication regimen. Continues to feel low energy at times and no interest in doing things.   2.) Hypertension - currently maintained on losartan. Reports taking medication as prescribed and denies adverse side effects or hypotensive readings. Blood pressure at home have been below goal. Denies worst headache of life or new symptoms of end organ damage.   BP Readings from Last 3 Encounters:  08/28/16 140/86  05/07/16 128/80  03/25/16 130/88     Allergies  Allergen Reactions  . Pregabalin Other (See Comments)      Outpatient Medications Prior to Visit  Medication Sig Dispense Refill  . aspirin 81 MG tablet Take 81 mg by mouth daily.    . diclofenac sodium (VOLTAREN) 1 % GEL Apply 2 g topically 4 (four) times daily. (Patient taking differently: Apply 2 g topically 4 (four) times daily as needed. ) 100 g 0  . donepezil (ARICEPT) 10 MG tablet Take 1 tablet (10 mg total) by mouth daily. 30 tablet 6  . DULoxetine (CYMBALTA) 30 MG capsule TAKE ONE CAPSULE BY MOUTH ONCE DAILY 30 capsule 11  . ranitidine (ZANTAC) 150 MG capsule Take 150 mg by mouth as needed.     . vitamin B-12 (CYANOCOBALAMIN) 1000 MCG tablet Take  1,000 mcg by mouth daily.    . benzonatate (TESSALON) 100 MG capsule Take 1 capsule (100 mg total) by mouth 2 (two) times daily as needed for cough. 30 capsule 0  . buPROPion (WELLBUTRIN XL) 150 MG 24 hr tablet TAKE ONE TABLET BY MOUTH ONCE DAILY 30 tablet 5  . gabapentin (NEURONTIN) 300 MG capsule Take 300 mg by mouth at bedtime as needed.     Marland Kitchen losartan (COZAAR) 100 MG tablet TAKE 1/2 TABLET ONCE A DAY 45 tablet 3  . fluticasone furoate-vilanterol (BREO ELLIPTA) 100-25 MCG/INH AEPB Inhale 1 puff into the lungs daily. (Patient taking differently: Inhale 1 puff into the lungs daily as needed. ) 14 each 0   No facility-administered medications prior to visit.     Review of Systems  Constitutional: Negative for chills and fever.  Eyes:       Negative for changes in vision  Respiratory: Negative for cough, chest tightness, shortness of breath and wheezing.   Cardiovascular: Negative for chest pain, palpitations and leg swelling.  Neurological: Negative for dizziness, weakness, light-headedness and headaches.      Objective:    BP 140/86 (BP Location: Left Arm, Patient Position: Sitting, Cuff Size: Normal)   Pulse 92   Temp 98.5 F (36.9 C) (Oral)   Resp 14   Ht 5\' 4"  (1.626 m)   Wt 142 lb (64.4 kg)   SpO2 95%   BMI 24.37 kg/m  Nursing note and vital  signs reviewed.  Physical Exam  Constitutional: She is oriented to person, place, and time. She appears well-developed and well-nourished. No distress.  Cardiovascular: Normal rate, regular rhythm, normal heart sounds and intact distal pulses.   Pulmonary/Chest: Effort normal and breath sounds normal.  Neurological: She is alert and oriented to person, place, and time.  Skin: Skin is warm and dry.  Psychiatric: She has a normal mood and affect. Her behavior is normal. Judgment and thought content normal.       Assessment & Plan:   Problem List Items Addressed This Visit      Cardiovascular and Mediastinum   Essential  hypertension - Primary    Blood pressure appears stable with current regimen and no adverse side effects. Below goal 150/90. Continue current dosage of losartan. Encouraged to monitor blood pressures at home and follow low-sodium diet. Denies worse headache of life or new symptoms of end organ damage. Continue to monitor.      Relevant Medications   losartan (COZAAR) 100 MG tablet   Other Relevant Orders   Comprehensive metabolic panel   CBC     Other   Cough    Continues to experience associated symptom of cough with concern for possible drainage/seasonal allergies. Recommend Allegra without pseudoephedrine. Start Singulair. Continue current dosage of Flonase. Follow-up pending trial of new medication.      Relevant Medications   benzonatate (TESSALON) 100 MG capsule   Depressed mood    Continues to experience depressed mood with decreased energy and lack of motivation to do activities. No suicidal ideations. Patient reports mood is improved with current medication regimen. Continue current dosage of bupropion. Continue to monitor.       Other Visit Diagnoses    Need for vaccination with 13-polyvalent pneumococcal conjugate vaccine       Relevant Orders   Pneumococcal conjugate vaccine 13-valent (Completed)   Encounter for immunization       Relevant Orders   Flu vaccine HIGH DOSE PF (Completed)       I have discontinued Ms. Mccanless's fluticasone furoate-vilanterol. I have also changed her buPROPion and gabapentin. Additionally, I am having her start on montelukast. Lastly, I am having her maintain her ranitidine, aspirin, vitamin B-12, diclofenac sodium, donepezil, DULoxetine, fexofenadine-pseudoephedrine, benzonatate, and losartan.   Meds ordered this encounter  Medications  . fexofenadine-pseudoephedrine (ALLEGRA-D 24) 180-240 MG 24 hr tablet    Sig: Take 1 tablet by mouth daily.  Marland Kitchen buPROPion (WELLBUTRIN XL) 150 MG 24 hr tablet    Sig: Take 1 tablet (150 mg total) by mouth  daily.    Dispense:  30 tablet    Refill:  1  . benzonatate (TESSALON) 100 MG capsule    Sig: Take 1 capsule (100 mg total) by mouth 2 (two) times daily as needed for cough.    Dispense:  30 capsule    Refill:  2  . losartan (COZAAR) 100 MG tablet    Sig: TAKE 1/2 TABLET ONCE A DAY    Dispense:  45 tablet    Refill:  3  . gabapentin (NEURONTIN) 300 MG capsule    Sig: Take 1 capsule (300 mg total) by mouth at bedtime as needed.    Dispense:  90 capsule    Refill:  0  . montelukast (SINGULAIR) 10 MG tablet    Sig: Take 1 tablet (10 mg total) by mouth at bedtime.    Dispense:  30 tablet    Refill:  0    Order Specific  Question:   Supervising Provider    Answer:   Pricilla Holm A [id number]     Follow-up: Return if symptoms worsen or fail to improve.  Mauricio Po, FNP

## 2016-08-28 NOTE — Assessment & Plan Note (Signed)
Continues to experience depressed mood with decreased energy and lack of motivation to do activities. No suicidal ideations. Patient reports mood is improved with current medication regimen. Continue current dosage of bupropion. Continue to monitor.

## 2016-08-28 NOTE — Assessment & Plan Note (Signed)
Blood pressure appears stable with current regimen and no adverse side effects. Below goal 150/90. Continue current dosage of losartan. Encouraged to monitor blood pressures at home and follow low-sodium diet. Denies worse headache of life or new symptoms of end organ damage. Continue to monitor.

## 2016-08-29 ENCOUNTER — Encounter: Payer: Self-pay | Admitting: Family

## 2016-08-29 LAB — COMPREHENSIVE METABOLIC PANEL
ALBUMIN: 3.6 g/dL (ref 3.5–5.2)
ALT: 12 U/L (ref 0–35)
AST: 17 U/L (ref 0–37)
Alkaline Phosphatase: 62 U/L (ref 39–117)
BUN: 12 mg/dL (ref 6–23)
CHLORIDE: 95 meq/L — AB (ref 96–112)
CO2: 30 meq/L (ref 19–32)
CREATININE: 0.82 mg/dL (ref 0.40–1.20)
Calcium: 8.6 mg/dL (ref 8.4–10.5)
GFR: 69.65 mL/min (ref >60.00)
GLUCOSE: 109 mg/dL — AB (ref 70–99)
Potassium: 4.5 mEq/L (ref 3.5–5.1)
SODIUM: 131 meq/L — AB (ref 135–145)
Total Bilirubin: 0.3 mg/dL (ref 0.2–1.2)
Total Protein: 6.8 g/dL (ref 6.0–8.3)

## 2016-10-03 ENCOUNTER — Telehealth: Payer: Self-pay | Admitting: Neurology

## 2016-10-03 NOTE — Telephone Encounter (Signed)
Pt called said her hip was bothering her and she wanted to get a shot for it and she needed to be seen immediately. I advised her I would look in her chart to find out when and what type of "shot" she had in the past. After looking, I told her I could not locate where she had been seen for her hip and that she did not get a shot at office. She said "Well, where did I"! I told her I would check to see what other Dr's she had seen in other practices but she should call her PCP. She said "Who is my PCP"! I advised her [facility] FNP referred her to [facility]. She said let me call my daughter and hung up. FYI

## 2016-10-08 ENCOUNTER — Ambulatory Visit: Payer: Medicare Other | Admitting: Family

## 2016-10-20 ENCOUNTER — Other Ambulatory Visit: Payer: Self-pay | Admitting: Family

## 2016-11-04 ENCOUNTER — Encounter: Payer: Self-pay | Admitting: Family

## 2016-11-04 ENCOUNTER — Ambulatory Visit (INDEPENDENT_AMBULATORY_CARE_PROVIDER_SITE_OTHER): Payer: Medicare Other | Admitting: Family

## 2016-11-04 DIAGNOSIS — J069 Acute upper respiratory infection, unspecified: Secondary | ICD-10-CM | POA: Diagnosis not present

## 2016-11-04 NOTE — Progress Notes (Signed)
Subjective:    Patient ID: Peggy Conner, female    DOB: 1924-12-15, 82 y.o.   MRN: [id number]  Chief Complaint  Patient presents with  . Cough    congestion, wheezing, and cough, x2 days some body aches, weakness    HPI:   Peggy Conner is a 82 y.o. female who  has a past medical history of Anxiety; Arthritis of foot; Carpal tunnel syndrome (09/14/2014); Depression; Gait disorder (09/06/2014); GERD (gastroesophageal reflux disease); HOH (hard of hearing); Hypertension; Memory difficulty (09/06/2014); Ovarian cancer ([place]); and Peripheral neuropathy ([place]). and presents today for an acute office visit.   This is a new problem. Associated symptoms of congestion, wheezing, cough, weakness, and body aches has been going on for about 2 days. No fevers. Cough is productive on occasion. Modifying factors include Advil and tessalon perales, and cough syrup which helped a little. Course of the symptoms is slightly improved today. No recent antibiotics. There are family members who are sick.   Allergies  Allergen Reactions  . Pregabalin Other (See Comments)      Outpatient Medications Prior to Visit  Medication Sig Dispense Refill  . aspirin 81 MG tablet Take 81 mg by mouth daily.    . benzonatate (TESSALON) 100 MG capsule Take 1 capsule (100 mg total) by mouth 2 (two) times daily as needed for cough. 30 capsule 2  . buPROPion (WELLBUTRIN XL) 150 MG 24 hr tablet TAKE ONE TABLET BY MOUTH ONCE DAILY 30 tablet 1  . diclofenac sodium (VOLTAREN) 1 % GEL Apply 2 g topically 4 (four) times daily. (Patient taking differently: Apply 2 g topically 4 (four) times daily as needed. ) 100 g 0  . donepezil (ARICEPT) 10 MG tablet Take 1 tablet (10 mg total) by mouth daily. 30 tablet 6  . DULoxetine (CYMBALTA) 30 MG capsule TAKE ONE CAPSULE BY MOUTH ONCE DAILY 30 capsule 11  . fexofenadine-pseudoephedrine (ALLEGRA-D 24) 180-240 MG 24 hr tablet Take 1 tablet by mouth daily.    Marland Kitchen gabapentin (NEURONTIN) 300 MG capsule Take  1 capsule (300 mg total) by mouth at bedtime as needed. 90 capsule 0  . losartan (COZAAR) 100 MG tablet TAKE 1/2 TABLET ONCE A DAY 45 tablet 3  . montelukast (SINGULAIR) 10 MG tablet Take 1 tablet (10 mg total) by mouth at bedtime. 30 tablet 0  . ranitidine (ZANTAC) 150 MG capsule Take 150 mg by mouth as needed.     . vitamin B-12 (CYANOCOBALAMIN) 1000 MCG tablet Take 1,000 mcg by mouth daily.     No facility-administered medications prior to visit.       Past Surgical History:  Procedure Laterality Date  . HIP PINNING  2009  . Ovarian Cancer Debulking   2007      Past Medical History:  Diagnosis Date  . Anxiety   . Arthritis of foot   . Carpal tunnel syndrome 09/14/2014   Bilateral  . Depression   . Gait disorder 09/06/2014  . GERD (gastroesophageal reflux disease)   . HOH (hard of hearing)    hearing aid, history of tinnitus  . Hypertension   . Memory difficulty 09/06/2014  . Ovarian cancer [facility])    s/p surgery and chemothearpy  . Peripheral neuropathy ([facility])       Review of Systems  Constitutional: Negative for chills and fever.  HENT: Positive for congestion. Negative for sore throat.   Respiratory: Positive for cough and wheezing.   Musculoskeletal:       Positive for body  aches.   Neurological: Positive for weakness.      Objective:    BP (!) 162/94 (BP Location: Left Arm, Patient Position: Sitting, Cuff Size: Normal)   Pulse (!) 105   Temp 99.1 F (37.3 C) (Oral)   Resp 16   Ht 5\' 4"  (1.626 m)   Wt 142 lb (64.4 kg)   SpO2 94%   BMI 24.37 kg/m  Nursing note and vital signs reviewed.   Physical Exam  Constitutional: She is oriented to person, place, and time. She appears well-developed and well-nourished. No distress.  HENT:  Right Ear: Hearing, tympanic membrane, external ear and ear canal normal.  Left Ear: Hearing, tympanic membrane, external ear and ear canal normal.  Nose: Nose normal. Right sinus exhibits no maxillary sinus tenderness and no  frontal sinus tenderness. Left sinus exhibits no maxillary sinus tenderness and no frontal sinus tenderness.  Mouth/Throat: Uvula is midline, oropharynx is clear and moist and mucous membranes are normal.  Cardiovascular: Normal rate, regular rhythm, normal heart sounds and intact distal pulses.   Pulmonary/Chest: Effort normal and breath sounds normal. No respiratory distress. She has no wheezes. She has no rales. She exhibits no tenderness.  Neurological: She is alert and oriented to person, place, and time.  Skin: Skin is warm and dry.  Psychiatric: She has a normal mood and affect. Her behavior is normal. Judgment and thought content normal.       Assessment & Plan:   Problem List Items Addressed This Visit      Respiratory   Acute upper respiratory infection    Symptoms and exam consistent with acute upper respiratory infection with concern for ease of development of pneumonia. Continue over the counter medication as needed for symptom relief and supportive care. Instructed to follow up if worsening. Follow up if symptoms worsen or do not improve.           I am having Ms. Henderson maintain her ranitidine, aspirin, vitamin B-12, diclofenac sodium, donepezil, DULoxetine, fexofenadine-pseudoephedrine, benzonatate, losartan, gabapentin, montelukast, and buPROPion.   Follow-up: Return if symptoms worsen or fail to improve.  Mauricio Po, FNP

## 2016-11-04 NOTE — Assessment & Plan Note (Signed)
Symptoms and exam consistent with acute upper respiratory infection with concern for ease of development of pneumonia. Continue over the counter medication as needed for symptom relief and supportive care. Instructed to follow up if worsening. Follow up if symptoms worsen or do not improve.

## 2016-11-04 NOTE — Patient Instructions (Addendum)
Thank you for choosing [facility].  SUMMARY AND INSTRUCTIONS:  If your symptoms worsen please let us know.  It appears you have an a viral infection.   Medication:  Continue to take your medications as prescribed.  Your prescription(s) have been submitted to your pharmacy or been printed and provided for you. Please take as directed and contact our office if you believe you are having problem(s) with the medication(s) or have any questions.  Follow up:  If your symptoms worsen or fail to improve, please contact our office for further instruction, or in case of emergency go directly to the emergency room at the closest medical facility.   General Recommendations:    Please drink plenty of fluids.  Get plenty of rest   Sleep in humidified air  Use saline nasal sprays  Netti pot   OTC Medications:  Decongestants - helps relieve congestion   Flonase (generic fluticasone) or Nasacort (generic triamcinolone) - please make sure to use the "cross-over" technique at a 45 degree angle towards the opposite eye as opposed to straight up the nasal passageway.   Sudafed (generic pseudoephedrine - Note this is the one that is available behind the pharmacy counter); Products with phenylephrine (-PE) may also be used but is often not as effective as pseudoephedrine.   If you have HIGH BLOOD PRESSURE - Coricidin HBP; AVOID any product that is -D as this contains pseudoephedrine which may increase your blood pressure.  Afrin (oxymetazoline) every 6-8 hours for up to 3 days.   Allergies - helps relieve runny nose, itchy eyes and sneezing   Claritin (generic loratidine), Allegra (fexofenidine), or Zyrtec (generic cyrterizine) for runny nose. These medications should not cause drowsiness.  Note - Benadryl (generic diphenhydramine) may be used however may cause drowsiness  Cough -   Delsym or Robitussin (generic dextromethorphan)  Expectorants - helps loosen mucus to ease  removal   Mucinex (generic guaifenesin) as directed on the package.  He Upper Respiratory Infection, Adult Most upper respiratory infections (URIs) are a viral infection of the air passages leading to the lungs. A URI affects the nose, throat, and upper air passages. The most common type of URI is nasopharyngitis and is typically referred to as "the common cold." URIs run their course and usually go away on their own. Most of the time, a URI does not require medical attention, but sometimes a bacterial infection in the upper airways can follow a viral infection. This is called a secondary infection. Sinus and middle ear infections are common types of secondary upper respiratory infections. Bacterial pneumonia can also complicate a URI. A URI can worsen asthma and chronic obstructive pulmonary disease (COPD). Sometimes, these complications can require emergency medical care and may be life threatening. What are the causes? Almost all URIs are caused by viruses. A virus is a type of germ and can spread from one person to another. What increases the risk? You may be at risk for a URI if:  You smoke.  You have chronic heart or lung disease.  You have a weakened defense (immune) system.  You are very young or very old.  You have nasal allergies or asthma.  You work in crowded or poorly ventilated areas.  You work in health care facilities or schools. What are the signs or symptoms? Symptoms typically develop 2-3 days after you come in contact with a cold virus. Most viral URIs last 7-10 days. However, viral URIs from the influenza virus (flu virus) can last 14-18  days and are typically more severe. Symptoms may include:  Runny or stuffy (congested) nose.  Sneezing.  Cough.  Sore throat.  Headache.  Fatigue.  Fever.  Loss of appetite.  Pain in your forehead, behind your eyes, and over your cheekbones (sinus pain).  Muscle aches. How is this diagnosed? Your health care  provider may diagnose a URI by:  Physical exam.  Tests to check that your symptoms are not due to another condition such as:  Strep throat.  Sinusitis.  Pneumonia.  Asthma. How is this treated? A URI goes away on its own with time. It cannot be cured with medicines, but medicines may be prescribed or recommended to relieve symptoms. Medicines may help:  Reduce your fever.  Reduce your cough.  Relieve nasal congestion. Follow these instructions at home:  Take medicines only as directed by your health care provider.  Gargle warm saltwater or take cough drops to comfort your throat as directed by your health care provider.  Use a warm mist humidifier or inhale steam from a shower to increase air moisture. This may make it easier to breathe.  Drink enough fluid to keep your urine clear or pale yellow.  Eat soups and other clear broths and maintain good nutrition.  Rest as needed.  Return to work when your temperature has returned to normal or as your health care provider advises. You may need to stay home longer to avoid infecting others. You can also use a face mask and careful hand washing to prevent spread of the virus.  Increase the usage of your inhaler if you have asthma.  Do not use any tobacco products, including cigarettes, chewing tobacco, or electronic cigarettes. If you need help quitting, ask your health care provider. How is this prevented? The best way to protect yourself from getting a cold is to practice good hygiene.  Avoid oral or hand contact with people with cold symptoms.  Wash your hands often if contact occurs. There is no clear evidence that vitamin C, vitamin E, echinacea, or exercise reduces the chance of developing a cold. However, it is always recommended to get plenty of rest, exercise, and practice good nutrition. Contact a health care provider if:  You are getting worse rather than better.  Your symptoms are not controlled by  medicine.  You have chills.  You have worsening shortness of breath.  You have brown or red mucus.  You have yellow or brown nasal discharge.  You have pain in your face, especially when you bend forward.  You have a fever.  You have swollen neck glands.  You have pain while swallowing.  You have white areas in the back of your throat. Get help right away if:  You have severe or persistent:  Headache.  Ear pain.  Sinus pain.  Chest pain.  You have chronic lung disease and any of the following:  Wheezing.  Prolonged cough.  Coughing up blood.  A change in your usual mucus.  You have a stiff neck.  You have changes in your:  Vision.  Hearing.  Thinking.  Mood. This information is not intended to replace advice given to you by your health care provider. Make sure you discuss any questions you have with your health care provider. Document Released: 05/13/2001 Document Revised: 07/20/2016 Document Reviewed: 02/22/2014 Elsevier Interactive Patient Education  2017 [place] / General Aches   Tylenol (generic acetaminophen) - DO NOT EXCEED 3 grams (3,000 mg) in a 24 hour time period  Advil/Motrin (generic ibuprofen)   Sore Throat -   Salt water gargle   Chloraseptic (generic benzocaine) spray or lozenges / Sucrets (generic dyclonine)

## 2016-11-06 ENCOUNTER — Ambulatory Visit: Payer: Medicare Other | Admitting: Neurology

## 2016-11-20 ENCOUNTER — Ambulatory Visit (INDEPENDENT_AMBULATORY_CARE_PROVIDER_SITE_OTHER): Payer: Medicare Other | Admitting: Neurology

## 2016-11-20 ENCOUNTER — Encounter: Payer: Self-pay | Admitting: Neurology

## 2016-11-20 VITALS — BP 144/72 | HR 92 | Resp 16 | Ht 64.0 in | Wt 141.0 lb

## 2016-11-20 DIAGNOSIS — G622 Polyneuropathy due to other toxic agents: Secondary | ICD-10-CM | POA: Diagnosis not present

## 2016-11-20 DIAGNOSIS — R269 Unspecified abnormalities of gait and mobility: Secondary | ICD-10-CM

## 2016-11-20 DIAGNOSIS — R413 Other amnesia: Secondary | ICD-10-CM | POA: Diagnosis not present

## 2016-11-20 MED ORDER — DONEPEZIL HCL 10 MG PO TABS: 10.0000 mg | ORAL_TABLET | Freq: Every day | ORAL | 3 refills | Status: DC

## 2016-11-20 NOTE — Progress Notes (Signed)
Reason for visit: Memory disturbance  Peggy Conner is an 82 y.o. female  History of present illness:  Peggy Conner is an 82 year old right-handed white female with a history of a mild memory disturbance and a gait disorder. The patient has a peripheral neuropathy associated with prior chemotherapy in the past. The patient has been walking with a cane, she reports no recent falls. She is on Cymbalta, but she denies any issues with burning or stinging sensations in the feet. The patient is also on Wellbutrin. She has had some problems with social withdrawal, her family has to force her to get out of the house, when she does she enjoys herself but it is very difficult to get her to initiate this activity. The patient does not exercise. She laments the fact that she can no longer drive a car or play golf. The patient returns to the office today for an evaluation. She remains on Aricept, she is tolerating this medication fairly well.  Past Medical History:  Diagnosis Date  . Anxiety   . Arthritis of foot   . Carpal tunnel syndrome 09/14/2014   Bilateral  . Depression   . Gait disorder 09/06/2014  . GERD (gastroesophageal reflux disease)   . HOH (hard of hearing)    hearing aid, history of tinnitus  . Hypertension   . Memory difficulty 09/06/2014  . Ovarian cancer [facility])    s/p surgery and chemothearpy  . Peripheral neuropathy [facility])     Past Surgical History:  Procedure Laterality Date  . HIP PINNING  2009  . Ovarian Cancer Debulking   2007    Family History  Problem Relation Age of Onset  . Lung cancer Brother   . Heart attack Father   . Cancer Brother   . Lung cancer Brother     Social history:  reports that she has never smoked. She has never used smokeless tobacco. She reports that she does not drink alcohol or use drugs.    Allergies  Allergen Reactions  . Pregabalin Other (See Comments)    Medications:  Prior to Admission medications   Medication Sig Start Date End Date  Taking? Authorizing Provider  aspirin 81 MG tablet Take 81 mg by mouth daily.   Yes Historical Provider, MD  benzonatate (TESSALON) 100 MG capsule Take 1 capsule (100 mg total) by mouth 2 (two) times daily as needed for cough. 08/28/16  Yes Golden Circle, FNP  buPROPion (WELLBUTRIN XL) 150 MG 24 hr tablet TAKE ONE TABLET BY MOUTH ONCE DAILY 10/20/16  Yes Golden Circle, FNP  diclofenac sodium (VOLTAREN) 1 % GEL Apply 2 g topically 4 (four) times daily. Patient taking differently: Apply 2 g topically 4 (four) times daily as needed.  03/29/15  Yes Golden Circle, FNP  donepezil (ARICEPT) 10 MG tablet Take 1 tablet (10 mg total) by mouth daily. 11/20/16  Yes Kathrynn Ducking, MD  fexofenadine-pseudoephedrine (ALLEGRA-D 24) 180-240 MG 24 hr tablet Take 1 tablet by mouth daily.   Yes Historical Provider, MD  gabapentin (NEURONTIN) 300 MG capsule Take 1 capsule (300 mg total) by mouth at bedtime as needed. 08/28/16  Yes Golden Circle, FNP  losartan (COZAAR) 100 MG tablet TAKE 1/2 TABLET ONCE A DAY 08/28/16  Yes Golden Circle, FNP  ranitidine (ZANTAC) 150 MG capsule Take 150 mg by mouth as needed.    Yes Historical Provider, MD  vitamin B-12 (CYANOCOBALAMIN) 1000 MCG tablet Take 500 mcg by mouth daily.  Yes Historical Provider, MD    ROS:  Out of a complete 14 system review of symptoms, the patient complains only of the following symptoms, and all other reviewed systems are negative.  Decreased activity, decreased appetite Incontinence of bladder Achy muscles, walking difficulty Daytime sleepiness Memory loss confusion, depression   Blood pressure (!) 144/72, pulse 92, resp. rate 16, height 5\' 4"  (1.626 m), weight 141 lb (64 kg).  Physical Exam  General: The patient is alert and cooperative at the time of the examination.  Skin: No significant peripheral edema is noted.   Neurologic Exam  Mental status: The patient is alert and oriented x 3 at the time of the examination. The  patient has apparent normal recent and remote memory, with an apparently normal attention span and concentration ability. Mini-Mental status examination done today shows a total score 24/30.   Cranial nerves: Facial symmetry is present. Speech is normal, no aphasia or dysarthria is noted. Extraocular movements are full. Visual fields are full.  Motor: The patient has good strength in all 4 extremities.  Sensory examination: Soft touch sensation is symmetric on the face, arms, and legs.  Coordination: The patient has good finger-nose-finger and heel-to-shin bilaterally.  Gait and station: The patient has a slightly wide-based gait, the patient walks with a cane. Tandem gait was not attempted. Romberg is negative. No drift is seen.  Reflexes: Deep tendon reflexes are symmetric.   Assessment/Plan:  1. Memory disturbance   2. Gait disturbance   The patient will be taken off of the Cymbalta. A prescription for Aricept was given. I have encouraged the patient to engage in more social activities, and try to exercise on a regular basis. The patient is having some excessive daytime drowsiness currently. She will follow-up in 6 months, sooner if needed.    Jill Alexanders MD 11/20/2016 2:57 PM  [facility] [address] [place], [place] [address]  Phone [phone] Fax [phone]

## 2016-11-20 NOTE — Patient Instructions (Signed)
   Stop the cymbalta 30 mg tablet.

## 2016-12-11 ENCOUNTER — Telehealth: Payer: Self-pay | Admitting: Neurology

## 2016-12-11 MED ORDER — BUPROPION HCL ER (XL) 300 MG PO TB24: 300.0000 mg | ORAL_TABLET | Freq: Every day | ORAL | 3 refills | Status: DC

## 2016-12-11 NOTE — Addendum Note (Signed)
Addended by: Margette Fast on: 12/11/2016 05:18 PM   Modules accepted: Orders

## 2016-12-11 NOTE — Telephone Encounter (Signed)
I called patient, talk with the daughter. The patient is withdrawn, does not wish to go out of the house and do things, is fine with staying about the house in her pajamas. I am concerned that she is having increasing problems with depression, her dementia is not that severe.  The patient is off Cymbalta, we will increase the Wellbutrin taking 300 mg a day, if no effect is noted, we may add low-dose Abilify to this.

## 2016-12-11 NOTE — Telephone Encounter (Signed)
Patient's daughter is calling. At last visit with Dr. Jannifer Franklin 11-20-16 he took her off Cymbalta because she was sleeping too much. She says the patient is still sleeping as much or more. Please call to discuss.

## 2017-01-03 ENCOUNTER — Encounter (HOSPITAL_COMMUNITY): Payer: Self-pay

## 2017-01-03 ENCOUNTER — Emergency Department (HOSPITAL_COMMUNITY): Payer: Medicare Other

## 2017-01-03 ENCOUNTER — Ambulatory Visit (INDEPENDENT_AMBULATORY_CARE_PROVIDER_SITE_OTHER): Payer: Self-pay | Admitting: Family Medicine

## 2017-01-03 ENCOUNTER — Encounter: Payer: Self-pay | Admitting: Family Medicine

## 2017-01-03 ENCOUNTER — Emergency Department (HOSPITAL_COMMUNITY)
Admission: EM | Admit: 2017-01-03 | Discharge: 2017-01-03 | Disposition: A | Discharge status: Home / Self Care | Payer: Medicare Other | Attending: Emergency Medicine | Admitting: Emergency Medicine

## 2017-01-03 VITALS — BP 134/78 | HR 60 | Temp 98.2°F | Wt 141.0 lb

## 2017-01-03 DIAGNOSIS — Z79899 Other long term (current) drug therapy: Secondary | ICD-10-CM | POA: Diagnosis not present

## 2017-01-03 DIAGNOSIS — I10 Essential (primary) hypertension: Secondary | ICD-10-CM | POA: Insufficient documentation

## 2017-01-03 DIAGNOSIS — Z7982 Long term (current) use of aspirin: Secondary | ICD-10-CM | POA: Diagnosis not present

## 2017-01-03 DIAGNOSIS — W010XXA Fall on same level from slipping, tripping and stumbling without subsequent striking against object, initial encounter: Secondary | ICD-10-CM | POA: Diagnosis not present

## 2017-01-03 DIAGNOSIS — S8991XA Unspecified injury of right lower leg, initial encounter: Secondary | ICD-10-CM | POA: Diagnosis present

## 2017-01-03 DIAGNOSIS — S82434A Nondisplaced oblique fracture of shaft of right fibula, initial encounter for closed fracture: Secondary | ICD-10-CM | POA: Insufficient documentation

## 2017-01-03 DIAGNOSIS — R52 Pain, unspecified: Secondary | ICD-10-CM

## 2017-01-03 DIAGNOSIS — S82831A Other fracture of upper and lower end of right fibula, initial encounter for closed fracture: Secondary | ICD-10-CM

## 2017-01-03 DIAGNOSIS — S99921A Unspecified injury of right foot, initial encounter: Secondary | ICD-10-CM

## 2017-01-03 DIAGNOSIS — Y929 Unspecified place or not applicable: Secondary | ICD-10-CM | POA: Insufficient documentation

## 2017-01-03 DIAGNOSIS — Y9301 Activity, walking, marching and hiking: Secondary | ICD-10-CM | POA: Insufficient documentation

## 2017-01-03 DIAGNOSIS — Y999 Unspecified external cause status: Secondary | ICD-10-CM | POA: Diagnosis not present

## 2017-01-03 NOTE — Assessment & Plan Note (Addendum)
Fall last night with right foot injury. Exam today concerning for lateral ankle fracture and/or base of 5th MT fracture. Will recommend she go to ER for expedited evaluation and casting if needed. Pt/caregiver agree with plan. No charge today.

## 2017-01-03 NOTE — ED Provider Notes (Signed)
[facility] DEPT Provider Note   CSN: [id number] Arrival date & time: 01/03/17  1315     History   Chief Complaint Chief Complaint  Patient presents with  . Ankle Pain    HPI Peggy Conner is a 83 y.o. female.  HPI Patient presents to the emergency room for evaluation of shin ankle and foot pain. Patient was walking yesterday when she tripped up a step and injured her ankle. She was able to walk following the fall but this morning she woke up with increasing pain and swelling.  She has pain in her right ankle, right foot, and right shin.  She denies any head injury.  No LOC.   Past Medical History:  Diagnosis Date  . Anxiety   . Arthritis of foot   . Carpal tunnel syndrome 09/14/2014   Bilateral  . Depression   . Gait disorder 09/06/2014  . GERD (gastroesophageal reflux disease)   . HOH (hard of hearing)    hearing aid, history of tinnitus  . Hypertension   . Memory difficulty 09/06/2014  . Ovarian cancer [facility])    s/p surgery and chemothearpy  . Peripheral neuropathy [facility])     Patient Active Problem List   Diagnosis Date Noted  . Foot injury, right, initial encounter 01/03/2017  . Acute upper respiratory infection 11/04/2016  . Depressed mood 08/16/2015  . Cough 12/28/2014  . Fatigue 12/28/2014  . Essential hypertension 12/28/2014  . Carpal tunnel syndrome 09/14/2014  . Memory difficulty 09/06/2014  . Gait disorder 09/06/2014  . 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 Ovarian cancer ([place]) 12/15/2011    Past Surgical History:  Procedure Laterality Date  . HIP PINNING  2009  . Ovarian Cancer Debulking   2007    OB History    No data available       Home Medications    Prior to Admission medications   Medication Sig Start Date End Date Taking? Authorizing Provider  aspirin 81 MG tablet Take 81 mg by mouth daily.   Yes Historical Provider, MD  buPROPion (WELLBUTRIN XL) 150 MG 24 hr tablet Take 150 mg by mouth daily. 11/27/16  Yes Historical Provider, MD    diclofenac sodium (VOLTAREN) 1 % GEL Apply 2 g topically 4 (four) times daily. Patient taking differently: Apply 2 g topically 4 (four) times daily as needed.  03/29/15  Yes Golden Circle, FNP  donepezil (ARICEPT) 10 MG tablet Take 1 tablet (10 mg total) by mouth daily. 11/20/16  Yes Kathrynn Ducking, MD  DULoxetine (CYMBALTA) 30 MG capsule Take 30 mg by mouth daily. 10/27/16  Yes Historical Provider, MD  fexofenadine-pseudoephedrine (ALLEGRA-D 24) 180-240 MG 24 hr tablet Take 1 tablet by mouth daily.   Yes Historical Provider, MD  gabapentin (NEURONTIN) 300 MG capsule Take 1 capsule (300 mg total) by mouth at bedtime as needed. 08/28/16  Yes Golden Circle, FNP  losartan (COZAAR) 100 MG tablet TAKE 1/2 TABLET ONCE A DAY 08/28/16  Yes Golden Circle, FNP  ranitidine (ZANTAC) 150 MG capsule Take 150 mg by mouth as needed.    Yes Historical Provider, MD  vitamin B-12 (CYANOCOBALAMIN) 1000 MCG tablet Take 500 mcg by mouth daily.    Yes Historical Provider, MD  benzonatate (TESSALON) 100 MG capsule Take 1 capsule (100 mg total) by mouth 2 (two) times daily as needed for cough. Patient not taking: Reported on 01/03/2017 08/28/16   Golden Circle, FNP  buPROPion (WELLBUTRIN XL) 300 MG 24 hr tablet Take  1 tablet (300 mg total) by mouth daily. Patient not taking: Reported on 01/03/2017 12/11/16   Kathrynn Ducking, MD    Family History Family History  Problem Relation Age of Onset  . Lung cancer Brother   . Heart attack Father   . Cancer Brother   . Lung cancer Brother     Social History Social History  Substance Use Topics  . Smoking status: Never Smoker  . Smokeless tobacco: Never Used  . Alcohol use No     Comment: occasional     Allergies   Pregabalin   Review of Systems Review of Systems  All other systems reviewed and are negative.    Physical Exam Updated Vital Signs BP 134/78 (BP Location: Left Arm)   Pulse 87   Temp 97.6 F (36.4 C) (Oral)   Resp 17   Ht 5\' 4"   (1.626 m)   Wt 64 kg   SpO2 94%   BMI 24.20 kg/m   Physical Exam  Constitutional: She appears well-developed and well-nourished. No distress.  HENT:  Head: Normocephalic and atraumatic.  Right Ear: External ear normal.  Left Ear: External ear normal.  Eyes: Conjunctivae are normal. Right eye exhibits no discharge. Left eye exhibits no discharge. No scleral icterus.  Neck: Neck supple. No tracheal deviation present.  Cardiovascular: Normal rate.   Pulmonary/Chest: Effort normal. No stridor. No respiratory distress.  Abdominal: She exhibits no distension.  Musculoskeletal: She exhibits no edema.  Neurological: She is alert. Cranial nerve deficit: no gross deficits.  Skin: Skin is warm and dry. No rash noted.  Psychiatric: She has a normal mood and affect.  Nursing note and vitals reviewed.    ED Treatments / Results    Radiology Dg Tibia/fibula Right  Result Date: 01/03/2017 CLINICAL DATA:  Patient was walking up a step yesterday and tripped up the step. Patient is c/o right ankle pain and swelling. Erythema also noted to distal portion tib/fib. H/o arthritis of foot. EXAM: RIGHT TIBIA AND FIBULA - 2 VIEW COMPARISON:  Right ankle same day FINDINGS: Proximal aspects of the tibia and fibula appear intact. Oblique fracture of the distal fibula. No radiopaque foreign body or soft tissue gas identified. Significant lateral soft tissue swelling at the ankle. IMPRESSION: Fracture of the distal fibula. Electronically Signed   By: Nolon Nations M.D.   On: 01/03/2017 15:10   Dg Ankle Complete Right  Result Date: 01/03/2017 CLINICAL DATA:  83 year old female status post fall at home yesterday with pain. Initial encounter. EXAM: RIGHT ANKLE - COMPLETE 3+ VIEW COMPARISON:  None. FINDINGS: Nondisplaced oblique fracture of the distal right fibula metadiaphysis extending to the level of the tibial plafond. Overlying soft tissue swelling. The lateral malleolus appears intact. Mortise joint alignment  preserved. Talar dome intact. Distal tibia and calcaneus appear intact. Calcified peripheral vascular disease. First TMT chronic joint degeneration. IMPRESSION: Nondisplaced oblique fracture of the distal right fibula metadiaphysis. Electronically Signed   By: Genevie Ann M.D.   On: 01/03/2017 13:53   Dg Foot Complete Right  Result Date: 01/03/2017 CLINICAL DATA:  83 year old female status post tripped going up stairs yesterday. Right ankle and distal tib-fib pain and swelling. Initial encounter. EXAM: RIGHT FOOT COMPLETE - 3+ VIEW COMPARISON:  Right tib-fib series today reported separately. FINDINGS: Advanced degenerative changes at the first tarsometatarsal joint and moderate degenerative changes at the first MTP joint. Bone mineralization is within normal limits for age. The calcaneus appears intact. Subluxation of the phalanges, but no acute fracture  or dislocation identified in the right foot. However there is an oblique fracture of the distal fibula evident. Calcified peripheral vascular disease. IMPRESSION: 1. Distal right fibula fracture, see ankle series. 2. No superimposed acute fracture or dislocation identified about the right foot. Electronically Signed   By: Genevie Ann M.D.   On: 01/03/2017 15:11    Procedures Procedures (including critical care time)  Medications Ordered in ED Medications - No data to display   Initial Impression / Assessment and Plan / ED Course  I have reviewed the triage vital signs and the nursing notes.  Pertinent labs & imaging results that were available during my care of the patient were reviewed by me and considered in my medical decision making (see chart for details).   Xrays reviewed with patient.  Will place in a cam walker.  Dc home with ortho follow up   Final Clinical Impressions(s) / ED Diagnoses   Final diagnoses:  Other closed fracture of distal end of right fibula, initial encounter    New Prescriptions New Prescriptions   No medications on  file     Dorie Rank, MD 01/03/17 [phone]

## 2017-01-03 NOTE — Patient Instructions (Addendum)
I'm worried you may have a lateral ankle fracture after fall.  Go to [facility] and we will let them know you're coming.  Good to meet you today. Call us with questions.

## 2017-01-03 NOTE — Progress Notes (Addendum)
BP 134/78   Pulse 60   Temp 98.2 F (36.8 C) (Oral)   Wt 141 lb (64 kg)   SpO2 94%   BMI 24.20 kg/m    CC: right foot injury Subjective:    Patient ID: Peggy Conner, female    DOB: 09-30-1925, 83 y.o.   MRN: [id number]  HPI: Peggy Conner is a 83 y.o. female presenting on 01/03/2017 for Foot Injury and Foot Swelling   Here with friend and caregiver.  1d ago fell onto concrete while walking at home outside - missed step. Since then right foot has gotten red and swollen and very tender. Barely able to walk. Known gait/balance trouble. Usually walks with cane.   Has treated at home with leg elevation. No NSAIDs, tylenol.   Relevant past medical, surgical, family and social history reviewed and updated as indicated. Interim medical history since our last visit reviewed. Allergies and medications reviewed and updated. Current Outpatient Prescriptions on File Prior to Visit  Medication Sig  . aspirin 81 MG tablet Take 81 mg by mouth daily.  . benzonatate (TESSALON) 100 MG capsule Take 1 capsule (100 mg total) by mouth 2 (two) times daily as needed for cough.  Marland Kitchen buPROPion (WELLBUTRIN XL) 300 MG 24 hr tablet Take 1 tablet (300 mg total) by mouth daily.  . diclofenac sodium (VOLTAREN) 1 % GEL Apply 2 g topically 4 (four) times daily. (Patient taking differently: Apply 2 g topically 4 (four) times daily as needed. )  . donepezil (ARICEPT) 10 MG tablet Take 1 tablet (10 mg total) by mouth daily.  . fexofenadine-pseudoephedrine (ALLEGRA-D 24) 180-240 MG 24 hr tablet Take 1 tablet by mouth daily.  Marland Kitchen gabapentin (NEURONTIN) 300 MG capsule Take 1 capsule (300 mg total) by mouth at bedtime as needed.  Marland Kitchen losartan (COZAAR) 100 MG tablet TAKE 1/2 TABLET ONCE A DAY  . ranitidine (ZANTAC) 150 MG capsule Take 150 mg by mouth as needed.   . vitamin B-12 (CYANOCOBALAMIN) 1000 MCG tablet Take 500 mcg by mouth daily.    No current facility-administered medications on file prior to visit.     Review of  Systems Per HPI unless specifically indicated in ROS section     Objective:    BP 134/78   Pulse 60   Temp 98.2 F (36.8 C) (Oral)   Wt 141 lb (64 kg)   SpO2 94%   BMI 24.20 kg/m   Wt Readings from Last 3 Encounters:  01/03/17 141 lb (64 kg)  11/20/16 141 lb (64 kg)  11/04/16 142 lb (64.4 kg)    Physical Exam  Constitutional: She appears well-developed and well-nourished. No distress.  Musculoskeletal: She exhibits edema.  2+ DP on left  Decreased pulse on left 2/2 swelling Tender to percussion lateral malleolus and at base of 5th MT Marked swelling lateral ankle and entire dorsal midfoot. No significant bruising. Able to bear limited weight but painful.   Skin: Skin is warm and dry.  Nursing note and vitals reviewed.     Assessment & Plan:   Problem List Items Addressed This Visit    Foot injury, right, initial encounter - Primary    Fall last night with right foot injury. Exam today concerning for lateral ankle fracture and/or base of 5th MT fracture. Will recommend she go to ER for expedited evaluation and casting if needed. Pt/caregiver agree with plan. No charge today.           Follow up plan: No Follow-up on file.  Ria Bush, MD

## 2017-01-03 NOTE — Progress Notes (Signed)
Pre visit review using our clinic review tool, if applicable. No additional management support is needed unless otherwise documented below in the visit note. 

## 2017-01-03 NOTE — ED Triage Notes (Signed)
Patient was walking up a step yesterday and tripped up the step.  Patient is c/o right ankle pain and swelling.  Patient denies dizziness, denies hitting head , no LOC.

## 2017-01-03 NOTE — ED Notes (Signed)
Ortho to apply cam walker.

## 2017-01-03 NOTE — ED Notes (Signed)
Bed: [id number] Expected date:  Expected time:  Means of arrival:  Comments: Ankle injury

## 2017-01-03 NOTE — Discharge Instructions (Signed)
Elevate your leg when sitting to help with the swelling, apply ice intermittently, tylenol as needed for pain

## 2017-01-14 ENCOUNTER — Ambulatory Visit (INDEPENDENT_AMBULATORY_CARE_PROVIDER_SITE_OTHER): Payer: Medicare Other | Admitting: Orthopedic Surgery

## 2017-01-14 ENCOUNTER — Encounter (INDEPENDENT_AMBULATORY_CARE_PROVIDER_SITE_OTHER): Payer: Self-pay | Admitting: Orthopedic Surgery

## 2017-01-14 ENCOUNTER — Ambulatory Visit (INDEPENDENT_AMBULATORY_CARE_PROVIDER_SITE_OTHER): Payer: Self-pay

## 2017-01-14 VITALS — Ht 64.0 in | Wt 141.0 lb

## 2017-01-14 DIAGNOSIS — S82891A Other fracture of right lower leg, initial encounter for closed fracture: Secondary | ICD-10-CM | POA: Insufficient documentation

## 2017-01-14 NOTE — Progress Notes (Signed)
Office Visit Note   Patient: Peggy Conner           Date of Birth: [birth year]           MRN: [id number] Visit Date: 01/14/2017              Requested by: No referring provider defined for this encounter. PCP: No PCP Per Patient  Chief Complaint  Patient presents with  . Right Ankle - Fracture    Nondisplaced oblique fracture of distal right fibula matadiaphysis. DOI 01/02/17 s/p fall.    HPI: Patient is 83 y.o female who tripped over a step in her home and fractured her right ankle. She has history of dementia per her family. She was seen at [facility] ER the following day on 01/03/17. She states she is unable to wear the cam walker due to its weight. She has not worn her fracture boot at all. She is full weightbearing with regular shoe wear and cane. She is accompanied with her daughter today. She takes occasional ibuprofen. Maxcine Ham, RT  Patient is pleasantly demented and will not use her cane will not use a fracture boot.  Assessment & Plan: Visit Diagnoses:  1. Ankle fracture, right, closed, initial encounter     Plan: We will have the patient continue with her activities of daily living she does have family that he'll assist her with walking. There is no indication for surgery this time she will not wear any type of immobilization is too heavy for her to manage.  Follow-Up Instructions: No Follow-up on file.   Ortho Exam Examination patient is not alert she does have dementia. She has normal respiratory effort she has an antalgic gait. Examination the right ankle there is minimal tenderness to palpation her ankle has good range of motion she does not have pain with weightbearing. Radiographs shows a minimally displaced Weber B fibular fracture with a congruent mortise. The skin is intact.  Imaging: Xr Ankle Complete Right  Result Date: 01/14/2017 Three-view radiographs of the right ankle shows a widely displaced Weber B fracture. The mortise is congruent.   Orders:  Orders  Placed This Encounter  Procedures  . XR Ankle Complete Right   No orders of the defined types were placed in this encounter.    Procedures: No procedures performed  Clinical Data: No additional findings.  Subjective: Review of Systems  Objective: Vital Signs: Ht 5\' 4"  (1.626 m)   Wt 141 lb (64 kg)   BMI 24.20 kg/m   Specialty Comments:  No specialty comments available.  PMFS History: Patient Active Problem List   Diagnosis Date Noted  . Ankle fracture, right, closed, initial encounter 01/14/2017  . Foot injury, right, initial encounter 01/03/2017  . Acute upper respiratory infection 11/04/2016  . Depressed mood 08/16/2015  . Cough 12/28/2014  . Fatigue 12/28/2014  . Essential hypertension 12/28/2014  . Carpal tunnel syndrome 09/14/2014  . Memory difficulty 09/06/2014  . Gait disorder 09/06/2014  . 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 Ovarian cancer ([place]) 12/15/2011   Past Medical History:  Diagnosis Date  . Anxiety   . Arthritis of foot   . Carpal tunnel syndrome 09/14/2014   Bilateral  . Depression   . Gait disorder 09/06/2014  . GERD (gastroesophageal reflux disease)   . HOH (hard of hearing)    hearing aid, history of tinnitus  . Hypertension   . Memory difficulty 09/06/2014  . Ovarian cancer [facility])    s/p surgery and chemothearpy  .  Peripheral neuropathy ([facility])     Family History  Problem Relation Age of Onset  . Lung cancer Brother   . Heart attack Father   . Cancer Brother   . Lung cancer Brother     Past Surgical History:  Procedure Laterality Date  . HIP PINNING  2009  . Ovarian Cancer Debulking   2007   Social History   Occupational History  . Retired    Social History Main Topics  . Smoking status: Never Smoker  . Smokeless tobacco: Never Used  . Alcohol use No     Comment: occasional  . Drug use: No  . Sexual activity: Not on file

## 2017-02-02 ENCOUNTER — Other Ambulatory Visit: Payer: Self-pay | Admitting: Family

## 2017-02-02 DIAGNOSIS — R05 Cough: Secondary | ICD-10-CM

## 2017-02-02 DIAGNOSIS — R059 Cough, unspecified: Secondary | ICD-10-CM

## 2017-04-13 ENCOUNTER — Other Ambulatory Visit: Payer: Self-pay | Admitting: Neurology

## 2017-04-16 ENCOUNTER — Other Ambulatory Visit: Payer: Self-pay | Admitting: Neurology

## 2017-04-17 ENCOUNTER — Telehealth: Payer: Self-pay | Admitting: Neurology

## 2017-04-17 MED ORDER — BUPROPION HCL ER (XL) 300 MG PO TB24: 300.0000 mg | ORAL_TABLET | Freq: Every day | ORAL | 1 refills | Status: DC

## 2017-04-17 NOTE — Addendum Note (Signed)
Addended by: Kathrynn Ducking on: 04/17/2017 01:16 PM   Modules accepted: Orders

## 2017-04-17 NOTE — Telephone Encounter (Signed)
I called the daughter, the patient is on the 300 mg tablet of Wellbutrin. I will call this prescription in.  The patient is having increasing problems with social withdrawal, lack of personal hygiene, change in appetite with 1 eats sweets, not eating a good meal. The patient is having dental cavities, she has macular degeneration with the eyes, there may be some underlying depression along with the dementia.  The patient will be seen in our office in June, we may consider Abilify with the Wellbutrin.

## 2017-04-17 NOTE — Telephone Encounter (Signed)
Pt's daughter Naiomi called said the pt is still taking buPROPion (WELLBUTRIN XL) 300 MG 24 hr tablet . Pt will be out of the medication on Sunday. Please send to [facility]

## 2017-04-17 NOTE — Telephone Encounter (Signed)
[place], spoke with April. Requested she clarify whether patient is getting Wellbutrin refilled and what dose, strength. April stated she last picked up refill 03/09/17 of prescription written by Dr Roxy Cedar, and does not have any more refills. Patient has not picked up Wellbutrin 150 mg since Dec 2017, prescription written by Toniann Ket, NP.  April stated the daughter and patient dispute over the dose, but the daughter is probably the reliable source.  Advised April this RN will send refill request to Dr Jannifer Franklin for approval.

## 2017-04-30 ENCOUNTER — Other Ambulatory Visit: Payer: Self-pay | Admitting: Family

## 2017-05-21 ENCOUNTER — Ambulatory Visit: Payer: Medicare Other | Admitting: Adult Health

## 2017-05-28 ENCOUNTER — Ambulatory Visit (INDEPENDENT_AMBULATORY_CARE_PROVIDER_SITE_OTHER): Payer: Medicare Other | Admitting: Adult Health

## 2017-05-28 ENCOUNTER — Encounter: Payer: Self-pay | Admitting: Adult Health

## 2017-05-28 VITALS — BP 116/71 | HR 94 | Wt 142.4 lb

## 2017-05-28 DIAGNOSIS — F329 Major depressive disorder, single episode, unspecified: Secondary | ICD-10-CM | POA: Diagnosis not present

## 2017-05-28 DIAGNOSIS — F32A Depression, unspecified: Secondary | ICD-10-CM

## 2017-05-28 DIAGNOSIS — R413 Other amnesia: Secondary | ICD-10-CM | POA: Diagnosis not present

## 2017-05-28 MED ORDER — ARIPIPRAZOLE 2 MG PO TABS: 2.0000 mg | ORAL_TABLET | Freq: Every day | ORAL | 5 refills | Status: DC

## 2017-05-28 NOTE — Progress Notes (Signed)
PATIENT: Peggy Conner DOB: 1924/12/10  REASON FOR VISIT: follow up- memory HISTORY FROM: patient  HISTORY OF PRESENT ILLNESS: 05/28/17: Peggy Conner is a 83 year old female with a history of  memory disturbance and gait disorder. She returns today for follow-up. Her daughter is with her today. Her daughter states that over the last several months she has become very reclusive. She does not want to be social. She would rather stay home in her pajamas all day. She states for the last 20 years she has dealt with depression. She lives at home alone but they do have aides with her throughout the day. She requires assistance with most ADLs. Her family makes all of her meals. Daughter states that she sleeps a lot. He also manages her finances. Daughter also feels that she is getting weaker because she says at home with minimal exercise. Patient is on Wellbutrin but they do not feel that the increase in Wellbutrin made a difference in her mood. She returns today for follow-up.  HISTORY Peggy Conner is an 83 year old right-handed white female with a history of a mild memory disturbance and a gait disorder. The patient has a peripheral neuropathy associated with prior chemotherapy in the past. The patient has been walking with a cane, she reports no recent falls. She is on Cymbalta, but she denies any issues with burning or stinging sensations in the feet. The patient is also on Wellbutrin. She has had some problems with social withdrawal, her family has to force her to get out of the house, when she does she enjoys herself but it is very difficult to get her to initiate this activity. The patient does not exercise. She laments the fact that she can no longer drive a car or play golf. The patient returns to the office today for an evaluation. She remains on Aricept, she is tolerating this medication fairly well.  REVIEW OF SYSTEMS: Out of a complete 14 system review of symptoms, the patient complains only of the following  symptoms, and all other reviewed systems are negative.  Cough, activity change, appetite change, fatigue and daytime sleepiness, walking difficulty, incontinence of bladder, environmental allergies, memory loss  ALLERGIES: Allergies  Allergen Reactions  . Pregabalin Other (See Comments)    HOME MEDICATIONS: Outpatient Medications Prior to Visit  Medication Sig Dispense Refill  . aspirin 81 MG tablet Take 81 mg by mouth daily.    . benzonatate (TESSALON) 100 MG capsule TAKE ONE CAPSULE BY MOUTH TWICE DAILY AS NEEDED FOR COUGH 30 capsule 2  . buPROPion (WELLBUTRIN XL) 300 MG 24 hr tablet Take 1 tablet (300 mg total) by mouth daily. 90 tablet 1  . diclofenac sodium (VOLTAREN) 1 % GEL Apply 2 g topically 4 (four) times daily. (Patient taking differently: Apply 2 g topically 4 (four) times daily as needed. ) 100 g 0  . donepezil (ARICEPT) 10 MG tablet Take 1 tablet (10 mg total) by mouth daily. 90 tablet 3  . fexofenadine-pseudoephedrine (ALLEGRA-D 24) 180-240 MG 24 hr tablet Take 1 tablet by mouth daily.    Marland Kitchen gabapentin (NEURONTIN) 300 MG capsule TAKE 1 CAPSULE BY MOUTH ONCE DAILY AT BEDTIME AS NEEDED 90 capsule 0  . losartan (COZAAR) 100 MG tablet TAKE 1/2 TABLET ONCE A DAY 45 tablet 3  . ranitidine (ZANTAC) 150 MG capsule Take 150 mg by mouth as needed.     . vitamin B-12 (CYANOCOBALAMIN) 1000 MCG tablet Take 500 mcg by mouth daily.     . DULoxetine (  CYMBALTA) 30 MG capsule Take 30 mg by mouth daily.  4   No facility-administered medications prior to visit.     PAST MEDICAL HISTORY: Past Medical History:  Diagnosis Date  . Anxiety   . Arthritis of foot   . Carpal tunnel syndrome 09/14/2014   Bilateral  . Depression   . Fracture of ankle    no surgery  . Gait disorder 09/06/2014  . GERD (gastroesophageal reflux disease)   . HOH (hard of hearing)    hearing aid, history of tinnitus  . Hypertension   . Memory difficulty 09/06/2014  . Ovarian cancer [facility])    s/p surgery and  chemothearpy  . Peripheral neuropathy     PAST SURGICAL HISTORY: Past Surgical History:  Procedure Laterality Date  . HIP PINNING  2009  . Ovarian Cancer Debulking   2007    FAMILY HISTORY: Family History  Problem Relation Age of Onset  . Lung cancer Brother   . Heart attack Father   . Cancer Brother   . Lung cancer Brother     SOCIAL HISTORY: Social History   Social History  . Marital status: Widowed    Spouse name: N/A  . Number of children: 2  . Years of education: 12   Occupational History  . Retired    Social History Main Topics  . Smoking status: Never Smoker  . Smokeless tobacco: Never Used  . Alcohol use No     Comment: occasional  . Drug use: No  . Sexual activity: Not on file   Other Topics Concern  . Not on file   Social History Narrative   05/28/17 Patient lives alone but has caregivers daily.    Patient has 2 children   Patient is retired.    Fun: Play golf, watch golf;    Patient drinks 3 cups of caffeine daily.   Patient is right handed.      PHYSICAL EXAM  Vitals:   05/28/17 1455  BP: 116/71  Pulse: 94  Weight: 142 lb 6.4 oz (64.6 kg)   Body mass index is 24.44 kg/m.   MMSE - Mini Mental State Exam 05/28/2017 11/20/2016 05/07/2016  Orientation to time _0 Orientation to Place _1 Registration _2 Attention/ Calculation _3 Recall _4 Language- name 2 objects _5 Language- repeat 0 1 1  Language- follow 3 step command _6 Language- read & follow direction _7 Write a sentence _8 Copy design 0 1 1  Total score _9 Generalized: Well developed, in no acute distress   Neurological examination  Mentation: Alert oriented to time, place, history taking. Follows all commands speech and language fluent Cranial nerve II-XII: Pupils were equal round reactive to light. Extraocular movements were full, visual field were full on confrontational test. Facial sensation and strength were normal. Uvula  tongue midline. Head turning and shoulder shrug  were normal and symmetric. Motor: The motor testing reveals 5 over 5 strength of all 4 extremities. Good symmetric motor tone is noted throughout.  Sensory: Sensory testing is intact to soft touch on all 4 extremities. No evidence of extinction is noted.  Coordination: Cerebellar testing reveals good finger-nose-finger and heel-to-shin bilaterally.  Gait and station: Patient uses a cane when ambulating.  Reflexes: Deep tendon reflexes are symmetric and normal bilaterally.   DIAGNOSTIC DATA (LABS, IMAGING,  TESTING) - I reviewed patient records, labs, notes, testing and imaging myself where available.  Lab Results  Component Value Date   WBC 6.3 08/28/2016   HGB 13.1 08/28/2016   HCT 39.0 08/28/2016   MCV 85.6 08/28/2016   PLT 422.0 (H) 08/28/2016      Component Value Date/Time   NA 131 (L) 08/28/2016 1704   K 4.5 08/28/2016 1704   CL 95 (L) 08/28/2016 1704   CO2 30 08/28/2016 1704   GLUCOSE 109 (H) 08/28/2016 1704   BUN 12 08/28/2016 1704   CREATININE 0.82 08/28/2016 1704   CALCIUM 8.6 08/28/2016 1704   PROT 6.8 08/28/2016 1704   ALBUMIN 3.6 08/28/2016 1704   AST 17 08/28/2016 1704   ALT 12 08/28/2016 1704   ALKPHOS 62 08/28/2016 1704   BILITOT 0.3 08/28/2016 1704   GFRNONAA >60 07/03/2008 0451   GFRAA  07/03/2008 0451    >60        The eGFR has been calculated using the MDRD equation. This calculation has not been validated in all clinical    Lab Results  Component Value Date   PJKDTOIZ12 458 09/06/2014   Lab Results  Component Value Date   TSH 2.19 12/28/2014      ASSESSMENT AND PLAN 83 y.o. year old female  has a past medical history of Anxiety; Arthritis of foot; Carpal tunnel syndrome (09/14/2014); Depression; Fracture of ankle; Gait disorder (09/06/2014); GERD (gastroesophageal reflux disease); HOH (hard of hearing); Hypertension; Memory difficulty (09/06/2014); Ovarian cancer ([place]); and Peripheral neuropathy.  here with:  1. Memory disturbance 2. Depression  The patient's memory score has declined slightly. She also has become more reclusive and less cooperative. This may be do to increasing depression. Dr. Jannifer Franklin suggested starting Abilify. I will start the patient on a low dose 2 mg daily. I reviewed side effects with the patient and her daughter and gave them a handout. They will continue on Wellbutrin for now. In the future we may consider starting Namenda for memory but for now she will continue on Aricept. They are amenable to this plan. They will call if symptoms worsen. She will follow-up in 6 months or sooner if needed.   I spent 25 minutes with the patient. 50% of this time was spent was spent reviewing treatment options and new medication abilify.     Ward Givens, MSN, NP-C 05/28/2017, 2:56 PM [facility] [address], [place], [place] [address] [phone]

## 2017-05-28 NOTE — Patient Instructions (Signed)
Your Plan:  Continue Aricept - memory score slightly declinded Continue Wellbutrin Start Abilify 2 mg daily  Thank you for coming to see [place] at [facility]. I hope we have been able to provide you high quality care today.  You may receive a patient satisfaction survey over the next few weeks. We would appreciate your feedback and comments so that we may continue to improve ourselves and the health of our patients. . Aripiprazole tablets What is this medicine? ARIPIPRAZOLE (ay ri PIP ray zole) is an atypical antipsychotic. It is used to treat schizophrenia and bipolar disorder, also known as manic-depression. It is also used to treat Tourette's disorder and some symptoms of autism. This medicine may also be used in combination with antidepressants to treat major depressive disorder. This medicine may be used for other purposes; ask your health care provider or pharmacist if you have questions. COMMON BRAND NAME(S): Abilify What should I tell my health care provider before I take this medicine? They need to know if you have any of these conditions: -dehydration -dementia -diabetes -heart disease -history of stroke -low blood counts, like low white cell, platelet, or red cell counts -Parkinson's disease -seizures -suicidal thoughts, plans, or attempt; a previous suicide attempt by you or a family member -an unusual or allergic reaction to aripiprazole, other medicines, foods, dyes, or preservatives -pregnant or trying to get pregnant -breast-feeding How should I use this medicine? Take this medicine by mouth with a glass of water. Follow the directions on the prescription label. You can take this medicine with or without food. Take your doses at regular intervals. Do not take your medicine more often than directed. Do not stop taking except on the advice of your doctor or health care professional. A special MedGuide will be given to you by the pharmacist with each  prescription and refill. Be sure to read this information carefully each time. Talk to your pediatrician regarding the use of this medicine in children. While this drug may be prescribed for children as young as 36 years of age for selected conditions, precautions do apply. Overdosage: If you think you have taken too much of this medicine contact a poison control center or emergency room at once. NOTE: This medicine is only for you. Do not share this medicine with others. What if I miss a dose? If you miss a dose, take it as soon as you can. If it is almost time for your next dose, take only that dose. Do not take double or extra doses. What may interact with this medicine? Do not take this medicine with any of the following medications: -brexpiprazole -cisapride -dofetilide -dronedarone -metoclopramide -pimozide -thioridazine This medicine may also interact with the following medications: -alcohol -carbamazepine -certain medicines for anxiety or sleep -certain medicines for blood pressure -certain medicines for fungal infections like ketoconazole, fluconazole, posaconazole, and itraconazole -clarithromycin -fluoxetine -other medicines that prolong the QT interval (cause an abnormal heart rhythm) -paroxetine -quinidine -rifampin This list may not describe all possible interactions. Give your health care provider a list of all the medicines, herbs, non-prescription drugs, or dietary supplements you use. Also tell them if you smoke, drink alcohol, or use illegal drugs. Some items may interact with your medicine. What should I watch for while using this medicine? Visit your doctor or health care professional for regular checks on your progress. It may be several weeks before you see the full effects of this medicine. Do not suddenly stop taking this medicine. You may need  to gradually reduce the dose. Patients and their families should watch out for worsening depression or thoughts of  suicide. Also watch out for sudden changes in feelings such as feeling anxious, agitated, panicky, irritable, hostile, aggressive, impulsive, severely restless, overly excited and hyperactive, or not being able to sleep. If this happens, especially at the beginning of antidepressant treatment or after a change in dose, call your health care professional. Dennis Bast may get dizzy or drowsy. Do not drive, use machinery, or do anything that needs mental alertness until you know how this medicine affects you. Do not stand or sit up quickly, especially if you are an older patient. This reduces the risk of dizzy or fainting spells. Alcohol can increase dizziness and drowsiness. Avoid alcoholic drinks. This medicine can reduce the response of your body to heat or cold. Dress warm in cold weather and stay hydrated in hot weather. If possible, avoid extreme temperatures like saunas, hot tubs, very hot or cold showers, or activities that can cause dehydration such as vigorous exercise. This medicine may cause dry eyes and blurred vision. If you wear contact lenses you may feel some discomfort. Lubricating drops may help. See your eye doctor if the problem does not go away or is severe. If you notice an increased hunger or thirst, different from your normal hunger or thirst, or if you find that you have to urinate more frequently, you should contact your health care provider as soon as possible. You may need to have your blood sugar monitored. This medicine may cause changes in your blood sugar levels. You should monitor you blood sugar frequently if you have diabetes. There have been reports of uncontrollable and strong urges to gamble, binge eat, shop, and have sex while taking this medicine. If you experience any of these or other uncontrollable and strong urges while taking this medicine, you should report it to your health care provider as soon as possible. What side effects may I notice from receiving this medicine? Side  effects that you should report to your doctor or health care professional as soon as possible: -allergic reactions like skin rash, itching or hives, swelling of the face, lips, or tongue -breathing problems -confusion -feeling faint or lightheaded, falls -fever or chills, sore throat -increased hunger or thirst -increased urination -joint pain -muscles pain, spasms -problems with balance, talking, walking -restlessness or need to keep moving -seizures -suicidal thoughts or other mood changes -trouble swallowing -uncontrollable and excessive urges (examples: gambling, binge eating, shopping, having sex) -uncontrollable head, mouth, neck, arm, or leg movements -unusually weak or tired Side effects that usually do not require medical attention (report to your doctor or health care professional if they continue or are bothersome): -blurred vision -constipation -headache -nausea, vomiting -trouble sleeping -weight gain This list may not describe all possible side effects. Call your doctor for medical advice about side effects. You may report side effects to FDA at 1-800-FDA-1088. Where should I keep my medicine? Keep out of the reach of children. Store at room temperature between 15 and 30 degrees C (59 and 86 degrees F). Throw away any unused medicine after the expiration date. NOTE: This sheet is a summary. It may not cover all possible information. If you have questions about this medicine, talk to your doctor, pharmacist, or health care provider.  2018 Elsevier/Gold Standard (2016-11-02 11:45:05)

## 2017-06-05 ENCOUNTER — Telehealth: Payer: Self-pay | Admitting: Neurology

## 2017-06-05 NOTE — Telephone Encounter (Signed)
Peggy Conner called regarding rx aripiprazole request, it has been approved and is good for a year.

## 2017-06-10 ENCOUNTER — Telehealth: Payer: Self-pay | Admitting: Adult Health

## 2017-06-10 NOTE — Telephone Encounter (Signed)
Patients daughter Verbie (listed on DPR) in reference to ARIPiprazole (ABILIFY) 2 MG tablet.  Lise has some concerns and would like to discuss before she goes to pick up the medication.  Please call

## 2017-06-10 NOTE — Telephone Encounter (Signed)
Returned dgtr, [employer] call. She stated the Abilify has been approved by patient's insurance, but she has not picked it up. Francies is concerned about her mother's "overall health". She stated she already sleeps a lot, is weak.  Daughter would like to know what the goal is. She asked is the goal to reduce wellbutrin, or to have her on have her on both medications?  Daughter asked, "How is this going to make it better?"  Daughter is going out of town, so this Therapist, sports discussed with her not starting mother on Abilify while she is out of town. Daughter will not. She would like a call back or my chart message sent to reply to her concerns, questions.

## 2017-06-12 ENCOUNTER — Encounter: Payer: Self-pay | Admitting: Adult Health

## 2017-06-12 NOTE — Telephone Encounter (Signed)
Mychart message sent.

## 2017-06-17 NOTE — Telephone Encounter (Signed)
Received [place] approval for abilify [facility] HMO ID [id number]   # [phone].   06-04-17 thru 06-04-18.

## 2017-06-29 ENCOUNTER — Other Ambulatory Visit: Payer: Self-pay | Admitting: *Deleted

## 2017-06-29 MED ORDER — BUPROPION HCL ER (XL) 300 MG PO TB24: 300.0000 mg | ORAL_TABLET | Freq: Every day | ORAL | 1 refills | Status: AC

## 2017-07-20 NOTE — Progress Notes (Signed)
I reviewed note and agree with plan.   Dinari Stgermaine R. Zamiyah Resendes, MD  Certified in Neurology, Neurophysiology and Neuroimaging  [facility] [address], [address] [place], [place] [address] [phone]   

## 2017-07-24 ENCOUNTER — Other Ambulatory Visit: Payer: Self-pay | Admitting: Family

## 2017-08-13 ENCOUNTER — Other Ambulatory Visit: Payer: Self-pay | Admitting: Family

## 2017-08-13 DIAGNOSIS — R059 Cough, unspecified: Secondary | ICD-10-CM

## 2017-08-13 DIAGNOSIS — R05 Cough: Secondary | ICD-10-CM

## 2017-11-26 ENCOUNTER — Other Ambulatory Visit: Payer: Self-pay | Admitting: Neurology

## 2017-12-16 ENCOUNTER — Encounter: Payer: Self-pay | Admitting: Neurology

## 2017-12-16 ENCOUNTER — Ambulatory Visit: Payer: Medicare Other | Admitting: Neurology

## 2017-12-16 VITALS — BP 128/82 | HR 84 | Ht 64.0 in | Wt 130.0 lb

## 2017-12-16 DIAGNOSIS — R269 Unspecified abnormalities of gait and mobility: Secondary | ICD-10-CM | POA: Diagnosis not present

## 2017-12-16 DIAGNOSIS — R413 Other amnesia: Secondary | ICD-10-CM | POA: Diagnosis not present

## 2017-12-16 MED ORDER — DONEPEZIL HCL 5 MG PO TABS: 5.0000 mg | ORAL_TABLET | Freq: Every day | ORAL | 3 refills | Status: DC

## 2017-12-16 NOTE — Progress Notes (Signed)
Reason for visit: Memory disorder  Peggy Conner is an 84 y.o. female  History of present illness:  Peggy Conner is a 84 year old right-handed white female with a history of a progressive memory disorder consistent with Alzheimer's disease.  The patient is on Aricept taking 10 mg at night.  She has had a reduction in appetite, she has lost 12 pounds since last seen and she has lost 27 pounds since she was placed on Aricept over 2 years ago.  The patient has no appetite, she does supplement her diet with Ensure.  She denies any diarrhea or stomach cramps.  The patient still has some troubles with depression, she is on Wellbutrin and Abilify for this.  She returns to the office today for an evaluation.  She comes in today with her daughter.  Past Medical History:  Diagnosis Date  . Anxiety   . Arthritis of foot   . Carpal tunnel syndrome 09/14/2014   Bilateral  . Depression   . Fracture of ankle    no surgery  . Gait disorder 09/06/2014  . GERD (gastroesophageal reflux disease)   . HOH (hard of hearing)    hearing aid, history of tinnitus  . Hypertension   . Macular degeneration, bilateral   . Memory difficulty 09/06/2014  . Ovarian cancer [facility])    s/p surgery and chemothearpy  . Peripheral neuropathy     Past Surgical History:  Procedure Laterality Date  . HIP PINNING  2009  . Ovarian Cancer Debulking   2007    Family History  Problem Relation Age of Onset  . Lung cancer Brother   . Heart attack Father   . Cancer Brother   . Lung cancer Brother     Social history:  reports that  has never smoked. she has never used smokeless tobacco. She reports that she does not drink alcohol or use drugs.    Allergies  Allergen Reactions  . Pregabalin Other (See Comments)    Medications:  Prior to Admission medications   Medication Sig Start Date End Date Taking? Authorizing Provider  ARIPiprazole (ABILIFY) 2 MG tablet Take 1 tablet (2 mg total) by mouth daily. 05/28/17  Yes Ward Givens, NP  benzonatate (TESSALON) 100 MG capsule TAKE 1 CAPSULE BY MOUTH TWICE DAILY AS NEEDED FOR COUGH 08/18/17  Yes Golden Circle, FNP  buPROPion (WELLBUTRIN XL) 300 MG 24 hr tablet Take 1 tablet (300 mg total) by mouth daily. 06/29/17  Yes Ward Givens, NP  donepezil (ARICEPT) 10 MG tablet TAKE ONE TABLET BY MOUTH ONCE DAILY 11/26/17  Yes Kathrynn Ducking, MD  ferrous sulfate 325 (65 FE) MG tablet Take 325 mg by mouth every other day.   Yes [provider]  fexofenadine-pseudoephedrine (ALLEGRA-D 24) 180-240 MG 24 hr tablet Take 1 tablet by mouth daily.   Yes [provider]  losartan (COZAAR) 100 MG tablet TAKE 1/2 TABLET ONCE A DAY 08/28/16  Yes Golden Circle, FNP  Multiple Vitamins-Minerals (MULTIVITAMIN ADULT PO) Take 1 tablet by mouth daily.   Yes [provider]  ranitidine (ZANTAC) 150 MG capsule Take 150 mg by mouth as needed.    Yes [provider]    ROS:  Out of a complete 14 system review of symptoms, the patient complains only of the following symptoms, and all other reviewed systems are negative.  Memory disorder Joint pain Walking problem  Blood pressure 128/82, pulse 84, height 5\' 4"  (1.626 m), weight 130 lb (59 kg),  SpO2 96 %.  Physical Exam  General: The patient is alert and cooperative at the time of the examination.  Skin: No significant peripheral edema is noted.   Neurologic Exam  Mental status: The patient is alert and oriented x 2 at the time of the examination (not oriented to date). The Mini-Mental status examination done today shows a total score of 19/30..   Cranial nerves: Facial symmetry is present. Speech is normal, no aphasia or dysarthria is noted. Extraocular movements are full. Visual fields are full.  Motor: The patient has good strength in all 4 extremities.  Sensory examination: Soft touch sensation is symmetric on the face, arms, and legs.  Coordination: The patient has good  finger-nose-finger and heel-to-shin bilaterally.  Gait and station: The patient has a slightly wide-based, unsteady gait.  The patient normally uses a cane for ambulation.  Tandem gait was not attempted.  Romberg is negative. No drift is seen.  Reflexes: Deep tendon reflexes are symmetric.   Assessment/Plan:  1.  Progressive memory disturbance, Alzheimer's disease  The patient will reduce the Aricept dose to 5 mg at night.  She has been losing weight on the current dose of Aricept, she has lost 12 pounds since last seen in June 2018.  The patient is managing at home, she has 4 caretakers that rotating to help her.  The depression appears to be stable but not fully treated.  She will follow-up in 6 months.  Jill Alexanders MD 12/16/2017 3:28 PM  [facility] [address] [place], [place] [address]  Phone [phone] Fax [phone]

## 2018-01-08 ENCOUNTER — Other Ambulatory Visit: Payer: Self-pay | Admitting: Adult Health

## 2018-03-04 ENCOUNTER — Encounter (HOSPITAL_COMMUNITY): Payer: Self-pay

## 2018-03-04 ENCOUNTER — Inpatient Hospital Stay (HOSPITAL_COMMUNITY)
Admission: EM | Admit: 2018-03-04 | Discharge: 2018-03-09 | DRG: 470 | Disposition: A | Discharge status: Skilled Nursing Facility | Payer: Medicare Other | Attending: Student in an Organized Health Care Education/Training Program | Admitting: Student in an Organized Health Care Education/Training Program

## 2018-03-04 ENCOUNTER — Emergency Department (HOSPITAL_COMMUNITY): Payer: Medicare Other

## 2018-03-04 DIAGNOSIS — R32 Unspecified urinary incontinence: Secondary | ICD-10-CM | POA: Diagnosis not present

## 2018-03-04 DIAGNOSIS — M80052A Age-related osteoporosis with current pathological fracture, left femur, initial encounter for fracture: Secondary | ICD-10-CM | POA: Diagnosis not present

## 2018-03-04 DIAGNOSIS — I951 Orthostatic hypotension: Secondary | ICD-10-CM | POA: Diagnosis not present

## 2018-03-04 DIAGNOSIS — S72012A Unspecified intracapsular fracture of left femur, initial encounter for closed fracture: Principal | ICD-10-CM | POA: Diagnosis present

## 2018-03-04 DIAGNOSIS — I441 Atrioventricular block, second degree: Secondary | ICD-10-CM | POA: Diagnosis not present

## 2018-03-04 DIAGNOSIS — F0390 Unspecified dementia without behavioral disturbance: Secondary | ICD-10-CM

## 2018-03-04 DIAGNOSIS — Z66 Do not resuscitate: Secondary | ICD-10-CM | POA: Diagnosis present

## 2018-03-04 DIAGNOSIS — E871 Hypo-osmolality and hyponatremia: Secondary | ICD-10-CM | POA: Diagnosis not present

## 2018-03-04 DIAGNOSIS — I10 Essential (primary) hypertension: Secondary | ICD-10-CM | POA: Diagnosis not present

## 2018-03-04 DIAGNOSIS — M810 Age-related osteoporosis without current pathological fracture: Secondary | ICD-10-CM | POA: Diagnosis present

## 2018-03-04 DIAGNOSIS — G629 Polyneuropathy, unspecified: Secondary | ICD-10-CM | POA: Diagnosis present

## 2018-03-04 DIAGNOSIS — R627 Adult failure to thrive: Secondary | ICD-10-CM | POA: Diagnosis present

## 2018-03-04 DIAGNOSIS — G301 Alzheimer's disease with late onset: Secondary | ICD-10-CM | POA: Diagnosis not present

## 2018-03-04 DIAGNOSIS — Z87310 Personal history of (healed) osteoporosis fracture: Secondary | ICD-10-CM | POA: Diagnosis not present

## 2018-03-04 DIAGNOSIS — E222 Syndrome of inappropriate secretion of antidiuretic hormone: Secondary | ICD-10-CM | POA: Diagnosis present

## 2018-03-04 DIAGNOSIS — S72002A Fracture of unspecified part of neck of left femur, initial encounter for closed fracture: Secondary | ICD-10-CM | POA: Diagnosis not present

## 2018-03-04 DIAGNOSIS — M19079 Primary osteoarthritis, unspecified ankle and foot: Secondary | ICD-10-CM | POA: Diagnosis present

## 2018-03-04 DIAGNOSIS — Z79899 Other long term (current) drug therapy: Secondary | ICD-10-CM | POA: Diagnosis not present

## 2018-03-04 DIAGNOSIS — S72009A Fracture of unspecified part of neck of unspecified femur, initial encounter for closed fracture: Secondary | ICD-10-CM | POA: Diagnosis present

## 2018-03-04 DIAGNOSIS — Z96641 Presence of right artificial hip joint: Secondary | ICD-10-CM | POA: Diagnosis present

## 2018-03-04 DIAGNOSIS — R197 Diarrhea, unspecified: Secondary | ICD-10-CM | POA: Diagnosis not present

## 2018-03-04 DIAGNOSIS — G3109 Other frontotemporal dementia: Secondary | ICD-10-CM | POA: Diagnosis not present

## 2018-03-04 DIAGNOSIS — R55 Syncope and collapse: Secondary | ICD-10-CM | POA: Diagnosis not present

## 2018-03-04 DIAGNOSIS — H9190 Unspecified hearing loss, unspecified ear: Secondary | ICD-10-CM | POA: Diagnosis present

## 2018-03-04 DIAGNOSIS — D649 Anemia, unspecified: Secondary | ICD-10-CM | POA: Diagnosis present

## 2018-03-04 DIAGNOSIS — W1830XA Fall on same level, unspecified, initial encounter: Secondary | ICD-10-CM | POA: Diagnosis present

## 2018-03-04 DIAGNOSIS — Z888 Allergy status to other drugs, medicaments and biological substances status: Secondary | ICD-10-CM | POA: Diagnosis not present

## 2018-03-04 DIAGNOSIS — M25552 Pain in left hip: Secondary | ICD-10-CM | POA: Diagnosis present

## 2018-03-04 DIAGNOSIS — W19XXXA Unspecified fall, initial encounter: Secondary | ICD-10-CM | POA: Diagnosis not present

## 2018-03-04 DIAGNOSIS — I361 Nonrheumatic tricuspid (valve) insufficiency: Secondary | ICD-10-CM | POA: Diagnosis not present

## 2018-03-04 DIAGNOSIS — F0280 Dementia in other diseases classified elsewhere without behavioral disturbance: Secondary | ICD-10-CM | POA: Diagnosis not present

## 2018-03-04 DIAGNOSIS — F329 Major depressive disorder, single episode, unspecified: Secondary | ICD-10-CM | POA: Diagnosis present

## 2018-03-04 DIAGNOSIS — F339 Major depressive disorder, recurrent, unspecified: Secondary | ICD-10-CM | POA: Diagnosis not present

## 2018-03-04 DIAGNOSIS — T148XXA Other injury of unspecified body region, initial encounter: Secondary | ICD-10-CM

## 2018-03-04 HISTORY — DX: Unspecified dementia, unspecified severity, without behavioral disturbance, psychotic disturbance, mood disturbance, and anxiety: F03.90

## 2018-03-04 LAB — CBC WITH DIFFERENTIAL/PLATELET
Basophils Absolute: 0 10*3/uL (ref 0.0–0.1)
Basophils Relative: 0 %
Eosinophils Absolute: 0 10*3/uL (ref 0.0–0.7)
Eosinophils Relative: 0 %
HCT: 40.9 % (ref 36.0–46.0)
Hemoglobin: 14 g/dL (ref 12.0–15.0)
Lymphocytes Relative: 10 %
Lymphs Abs: 1.1 10*3/uL (ref 0.7–4.0)
MCH: 31.7 pg (ref 26.0–34.0)
MCHC: 34.2 g/dL (ref 30.0–36.0)
MCV: 92.7 fL (ref 78.0–100.0)
Monocytes Absolute: 0.9 10*3/uL (ref 0.1–1.0)
Monocytes Relative: 8 %
Neutro Abs: 9.6 10*3/uL — ABNORMAL HIGH (ref 1.7–7.7)
Neutrophils Relative %: 82 %
Platelets: 301 10*3/uL (ref 150–400)
RBC: 4.41 MIL/uL (ref 3.87–5.11)
RDW: 13.2 % (ref 11.5–15.5)
WBC: 11.7 10*3/uL — ABNORMAL HIGH (ref 4.0–10.5)

## 2018-03-04 LAB — BASIC METABOLIC PANEL
Anion gap: 11 (ref 5–15)
BUN: 12 mg/dL (ref 6–20)
CO2: 23 mmol/L (ref 22–32)
Calcium: 8.3 mg/dL — ABNORMAL LOW (ref 8.9–10.3)
Chloride: 98 mmol/L — ABNORMAL LOW (ref 101–111)
Creatinine, Ser: 0.72 mg/dL (ref 0.44–1.00)
GFR calc Af Amer: >60 mL/min (ref >60)
GFR calc non Af Amer: >60 mL/min (ref >60)
Glucose, Bld: 129 mg/dL — ABNORMAL HIGH (ref 65–99)
Potassium: 3.9 mmol/L (ref 3.5–5.1)
Sodium: 132 mmol/L — ABNORMAL LOW (ref 135–145)

## 2018-03-04 MED ORDER — MORPHINE SULFATE (PF) 4 MG/ML IV SOLN
4.0000 mg | Freq: Once | INTRAVENOUS | Status: AC
  Administered 2018-03-04: 4 mg via INTRAVENOUS
  Filled 2018-03-04: qty 1

## 2018-03-04 MED ORDER — SODIUM CHLORIDE 0.9 % IV SOLN
INTRAVENOUS | Status: DC
  Administered 2018-03-04 – 2018-03-07 (×4): via INTRAVENOUS

## 2018-03-04 MED ORDER — DONEPEZIL HCL 5 MG PO TABS
5.0000 mg | ORAL_TABLET | Freq: Every day | ORAL | Status: DC
  Administered 2018-03-05: 5 mg via ORAL
  Filled 2018-03-04 (×2): qty 1

## 2018-03-04 MED ORDER — MORPHINE SULFATE (PF) 4 MG/ML IV SOLN
2.0000 mg | INTRAVENOUS | Status: DC | PRN
  Administered 2018-03-05: 2 mg via INTRAVENOUS
  Filled 2018-03-04: qty 1

## 2018-03-04 MED ORDER — LOSARTAN POTASSIUM 50 MG PO TABS
50.0000 mg | ORAL_TABLET | Freq: Every day | ORAL | Status: DC
  Administered 2018-03-06 – 2018-03-07 (×2): 50 mg via ORAL
  Filled 2018-03-04 (×2): qty 1

## 2018-03-04 MED ORDER — HEPARIN SODIUM (PORCINE) 5000 UNIT/ML IJ SOLN
5000.0000 [IU] | Freq: Three times a day (TID) | INTRAMUSCULAR | Status: DC
  Administered 2018-03-05 – 2018-03-06 (×2): 5000 [IU] via SUBCUTANEOUS
  Filled 2018-03-04 (×2): qty 1

## 2018-03-04 NOTE — ED Triage Notes (Signed)
Pt arrived via [facility] from home found on floor at home caretaker last saw at 9pm, pt states it was light outside when she fell. Left groin and hip pain.  EMS gave 523ml NS

## 2018-03-04 NOTE — ED Provider Notes (Signed)
[facility] EMERGENCY DEPARTMENT Provider Note   CSN: [id number] Arrival date & time: 03/04/18  1214     History   Chief Complaint Chief Complaint  Patient presents with  . Fall    HPI Peggy Conner is a 84 y.o. female.  HPI   84 year old female with left hip pain.  She has history of dementia and is on the greatest historian.  Daughter is at bedside supplemented history.  He was found on the floor this morning by caretakers.  Last seen normal around 9 PM yesterday.  Patient is unable to give the exact time but says that the sun was out when she fell.  She states that she lost her balance.  She has had severe left hip/groin pain since then.  Denies any acute pain elsewhere.  Family reports that she is at her baseline mentation. Past Medical History:  Diagnosis Date  . Anxiety   . Arthritis of foot   . Carpal tunnel syndrome 09/14/2014   Bilateral  . Depression   . Fracture of ankle    no surgery  . Gait disorder 09/06/2014  . GERD (gastroesophageal reflux disease)   . HOH (hard of hearing)    hearing aid, history of tinnitus  . Hypertension   . Macular degeneration, bilateral   . Memory difficulty 09/06/2014  . Ovarian cancer [facility])    s/p surgery and chemothearpy  . Peripheral neuropathy     Patient Active Problem List   Diagnosis Date Noted  . Ankle fracture, right, closed, initial encounter 01/14/2017  . Foot injury, right, initial encounter 01/03/2017  . Acute upper respiratory infection 11/04/2016  . Depressed mood 08/16/2015  . Cough 12/28/2014  . Fatigue 12/28/2014  . Essential hypertension 12/28/2014  . Carpal tunnel syndrome 09/14/2014  . Memory difficulty 09/06/2014  . Gait disorder 09/06/2014  . 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 Ovarian cancer ([place]) 12/15/2011    Past Surgical History:  Procedure Laterality Date  . HIP PINNING  2009  . Ovarian Cancer Debulking   2007     OB History   None      Home Medications    Prior to  Admission medications   Medication Sig Start Date End Date Taking? Authorizing Provider  ARIPiprazole (ABILIFY) 2 MG tablet TAKE 1 TABLET BY MOUTH ONCE DAILY 01/08/18   Kathrynn Ducking, MD  benzonatate (TESSALON) 100 MG capsule TAKE 1 CAPSULE BY MOUTH TWICE DAILY AS NEEDED FOR COUGH 08/18/17   Golden Circle, FNP  buPROPion (WELLBUTRIN XL) 300 MG 24 hr tablet Take 1 tablet (300 mg total) by mouth daily. 06/29/17   Ward Givens, NP  donepezil (ARICEPT) 5 MG tablet Take 1 tablet (5 mg total) by mouth at bedtime. 12/16/17   Kathrynn Ducking, MD  ferrous sulfate 325 (65 FE) MG tablet Take 325 mg by mouth every other day.    [provider]  fexofenadine-pseudoephedrine (ALLEGRA-D 24) 180-240 MG 24 hr tablet Take 1 tablet by mouth daily.    [provider]  losartan (COZAAR) 100 MG tablet TAKE 1/2 TABLET ONCE A DAY 08/28/16   Golden Circle, FNP  Multiple Vitamins-Minerals (MULTIVITAMIN ADULT PO) Take 1 tablet by mouth daily.    [provider]  ranitidine (ZANTAC) 150 MG capsule Take 150 mg by mouth as needed.     [provider]    Family History Family History  Problem Relation Age of Onset  . Lung cancer Brother   . Heart  attack Father   . Cancer Brother   . Lung cancer Brother     Social History Social History   Tobacco Use  . Smoking status: Never Smoker  . Smokeless tobacco: Never Used  Substance Use Topics  . Alcohol use: No    Comment: occasional  . Drug use: No     Allergies   Pregabalin   Review of Systems Review of Systems  All systems reviewed and negative, other than as noted in HPI.  Physical Exam Updated Vital Signs BP (!) 144/85   Pulse 92   Resp 13   Ht 5\' 4"  (1.626 m)   Wt 59 kg (130 lb)   SpO2 97%   BMI 22.31 kg/m   Physical Exam  Constitutional: She appears well-developed and well-nourished. No distress.  HENT:  Head: Normocephalic and atraumatic.  Eyes: Conjunctivae are normal. Right eye exhibits no  discharge. Left eye exhibits no discharge.  Neck: Neck supple.  Cardiovascular: Normal rate, regular rhythm and normal heart sounds. Exam reveals no gallop and no friction rub.  No murmur heard. Pulmonary/Chest: Effort normal and breath sounds normal. No respiratory distress.  Abdominal: Soft. She exhibits no distension. There is no tenderness.  Musculoskeletal: She exhibits tenderness and deformity.  Left lower extremity is shortened and externally rotated as compared to the right.  Exquisite pain with attempted range of motion of the left hip.  Closed injury.  Able to move her foot.  Palpable DP pulse.  Reports sensation light touch in her foot.  No apparent pain with range of motion of large joints elsewhere.  No midline spinal tenderness.  Neurological: She is alert.  Skin: Skin is warm and dry.  Psychiatric: She has a normal mood and affect. Her behavior is normal. Thought content normal.  Nursing note and vitals reviewed.    ED Treatments / Results  Labs (all labs ordered are listed, but only abnormal results are displayed) Labs Reviewed  CBC WITH DIFFERENTIAL/PLATELET - Abnormal; Notable for the following components:      Result Value   WBC 11.7 (*)    Neutro Abs 9.6 (*)    All other components within normal limits  BASIC METABOLIC PANEL - Abnormal; Notable for the following components:   Sodium 132 (*)    Chloride 98 (*)    Glucose, Bld 129 (*)    Calcium 8.3 (*)    All other components within normal limits    EKG EKG Interpretation  Date/Time:  Thursday March 04 2018 12:25:51 EDT Ventricular Rate:  95 PR Interval:    QRS Duration: 113 QT Interval:  392 QTC Calculation: 493 R Axis:   -59 Text Interpretation:  Sinus rhythm Left anterior fascicular block Abnormal R-wave progression, late transition Nonspecific T abnormalities, lateral leads Borderline prolonged QT interval Confirmed by Virgel Manifold [phone]) on 03/04/2018 12:42:48 PM   Radiology Dg Chest 1  View  Result Date: 03/04/2018 CLINICAL DATA:  Pain following fall EXAM: CHEST  1 VIEW COMPARISON:  June 15, 2014 FINDINGS: Lungs are clear. Heart size and pulmonary vascularity are normal. No adenopathy. There is aortic atherosclerosis. There is no pneumothorax. No fractures are evident. There is degenerative change in the thoracic spine. IMPRESSION: No edema or consolidation. No pneumothorax. There is aortic atherosclerosis. Aortic Atherosclerosis (ICD10-I70.0). Electronically Signed   By: Lowella Grip III M.D.   On: 03/04/2018 13:33   Dg Hip Unilat W Or Wo Pelvis 2-3 Views Left  Result Date: 03/04/2018 CLINICAL DATA:  Pain following fall  EXAM: DG HIP (WITH OR WITHOUT PELVIS) 2-3V LEFT COMPARISON:  None. FINDINGS: Frontal pelvis as well as frontal and lateral left hip images were obtained. There is a fracture of the subcapital femoral neck region on the left with varus angulation at the fracture site. No other fractures are evident. No dislocation. There is a total hip replacement on the right with prosthetic components well-seated. There is underlying osteoporosis. There is mild narrowing of the left hip joint. IMPRESSION: Subcapital femoral neck fracture on the left with varus angulation at the fracture site. Total hip replacement on the right with prosthetic components well-seated. No dislocation. Bones osteoporotic. Electronically Signed   By: Lowella Grip III M.D.   On: 03/04/2018 13:35    Procedures Procedures (including critical care time)  Medications Ordered in ED Medications  morphine 4 MG/ML injection 4 mg (4 mg Intravenous Given 03/04/18 1338)     Initial Impression / Assessment and Plan / ED Course  I have reviewed the triage vital signs and the nursing notes.  Pertinent labs & imaging results that were available during my care of the patient were reviewed by me and considered in my medical decision making (see chart for details).     84 year old female with left hip  fracture.  Orthopedics was consulted.  Medical admission.    Final Clinical Impressions(s) / ED Diagnoses   Final diagnoses:  Left displaced femoral neck fracture [facility])    ED Discharge Orders    None       Virgel Manifold, MD 03/04/18 1640

## 2018-03-04 NOTE — H&P (Signed)
Date: 03/04/2018               Patient Name:  Peggy Conner MRN: [id number]  DOB: Aug 22, 1925 Age / Sex: 84 y.o., female   PCP: Nickola Major, MD         Medical Service: Internal Medicine Teaching Service         Attending Physician: Dr. Virgel Manifold, MD    First Contact: Dr. Berline Lopes Pager: [phone]  Second Contact: Dr. Hetty Ely Pager: [phone]       After Hours (After 5p/  First Contact Pager: [phone]  weekends / holidays): Second Contact Pager: [phone]   Chief Complaint: Golden Circle  History of Present Illness: HPI A significant portion of the HPI was provided by the family at bedside due to the severity of the patietns dementia.  Peggy Conner is a 84 yo F with a PMHx notable for dementia, HTN, depression, right hip fracture s/p internal fixation (~8-53yrs prior), who presented following a fall early the day of admission. The patient was prepared for bed by her caregiver and left in her bed around 8pm the evening prior. At ~10am on 03/04/2018 he caregiver returned and found her lying on the floor by her bed. The patient was not sure how long she was down and typically only rises at 9:00 am. She stated that when she arose to proceed to the restroom she was unstable as her cain was not nearby and she fell. She denied LOC, striking her head, headache, visual changes, weakness, chest pain, or other symptoms during associated with the fall. There was loss of bladder control which is an ongoing issue for her but this was likely due to the fact that she could not make it to the restroom. There was no described loss of bowel control, tremor, palpitations, prodrome, or acute weakness.   ED Course: HIP w/ pelvis revealed a subcapital femoral neck fracture on the left with associated rotation and a total hip replacement on the right. CBC was notable for a mild leukocytosis of 11.7, with Hgb 14.0. BMP was again notable for hyponatremia/hypochloremia, which appears to be chronic for at least 6 years and  persistent/stable.   Meds:  Current Meds  Medication Sig  . acetaminophen (ARTHRITIS PAIN RELIEF) 650 MG CR tablet Take 650 mg by mouth every 8 (eight) hours as needed for pain.  . ARIPiprazole (ABILIFY) 2 MG tablet TAKE 1 TABLET BY MOUTH ONCE DAILY (Patient taking differently: TAKE 2 mg  TABLET BY MOUTH ONCE DAILY)  . docusate sodium (COLACE) 100 MG capsule Take 100 mg by mouth 2 (two) times daily.  Marland Kitchen donepezil (ARICEPT) 5 MG tablet Take 1 tablet (5 mg total) by mouth at bedtime.  . ferrous sulfate 325 (65 FE) MG tablet Take 325 mg by mouth every other day.  . losartan (COZAAR) 100 MG tablet TAKE 1/2 TABLET ONCE A DAY (Patient taking differently: Take 50 mg by mouth daily. TAKE 1/2 TABLET ONCE A DAY)   Allergies: Allergies as of 03/04/2018 - Review Complete 03/04/2018  Allergen Reaction Noted  . Pregabalin Other (See Comments) 05/07/2015   Past Medical History:  Diagnosis Date  . Anxiety   . Arthritis of foot   . Carpal tunnel syndrome 09/14/2014   Bilateral  . Depression   . Fracture of ankle    no surgery  . Gait disorder 09/06/2014  . GERD (gastroesophageal reflux disease)   . HOH (hard of hearing)    hearing aid, history of tinnitus  .  Hypertension   . Macular degeneration, bilateral   . Memory difficulty 09/06/2014  . Ovarian cancer [facility])    s/p surgery and chemothearpy  . Peripheral neuropathy    Family History:  Mother HTN Father, HTN, deceased of MI in 58's  Social History:  Denied tobacco, EtOH, Illicit drugs  Review of Systems: A complete ROS was negative except as per HPI.   Physical Exam: Blood pressure (!) 146/65, pulse 92, resp. rate 16, height 5\' 4"  (1.626 m), weight 130 lb (59 kg), SpO2 95 %. Physical Exam  Constitutional: She appears well-developed and well-nourished. No distress.  HENT:  Head: Normocephalic and atraumatic.  Eyes: Conjunctivae and EOM are normal.  Neck: Normal range of motion. Neck supple.  Cardiovascular: Normal rate and regular  rhythm.  No murmur heard. Pulmonary/Chest: Effort normal and breath sounds normal. No stridor. No respiratory distress.  Abdominal: Soft. Bowel sounds are normal. She exhibits no distension. There is no tenderness.  Musculoskeletal: She exhibits no edema or tenderness.  Neurological: She is alert.  Oriented to person, date and that she has leg pain.  Skin: Skin is warm. Capillary refill takes less than 2 seconds. She is not diaphoretic.  Psychiatric: She has a normal mood and affect.   EKG: personally reviewed my interpretation is NSR  CXR: personally reviewed my interpretation is no acute cardiopulmonary process  Assessment & Plan by Problem: Active Problems:   Essential hypertension   Closed left hip fracture, initial encounter [facility])   Dementia  Assessment: Peggy Conner is a 63 yoF with frontal lobe dementia, HTN, and depression who presented with an acute fall resulting in a left hip fracture. She was seen by orthopedics who determined surgical repair was appropriate if cleared by a primary hospital team. Given her excellent physical health despite her age, quality of life (living alone at home), and I feel that she is at appropriate risk for surgery. I am concerned that without surgery she may fail to regain use of the affected extremity in a manner that is conducive to recovery due both to the location of the fracture and osteoporosis.   Plan: Closed left hip fracture, initial encounter: Orthopedics planning for surgery in am. She is NPO after midnight, meal ordered for this evening.  Pain control with IV morphine, will switch to PO following surgery. Would appreciate ortho's advised preference for pain control. IV morphine 4mg  Q4 hours PRN for pain.    Dementia: Upon exam it was noted that the patient alert but oriented only to self and the fact that her hip is in pain. She was not clearly able to proceed with the HPI. The patients family had with them a medical power of attorney as  well as documentation to support her DNR status. She will need to have a singed DNR form for this stay completed. --Continue Aricept 5mg  daily  Hyponatremia: This appears to be a chronic issue. Na today was 132 which appears ~= to her last 6 years worth of data in our system. We will hold her centrally acting medications and monitor her sodium in the morning.   Depression:  Holding Abilify, claritin, wellbutrin due to her fall. Please continue to hold and we sill further discuss this with the family in the morning.    Essential HTN: Patient was mildly hypertensive today during evaluation to 153/83. --Losartan 50mg  daily --Please call MD for PRN's at Sys>180 or Dia >110  Diet: Regular, NPO after midnight Code: DNR Fluids: n/a VTE ppx: Hepain Dispo:  Admit patient to Inpatient with expected length of stay greater than 2 midnights.  Signed: Kathi Ludwig, MD 03/04/2018, 5:03 PM  Pager: Pager# [phone]

## 2018-03-04 NOTE — Consult Note (Signed)
Reason for Consult:Left hip fx Referring Physician: Eugenio Hoes  Peggy Conner is an 84 y.o. female.  HPI: [name] was at home in her ALF when she suffered what sounds like a mechanical fall although she's a little hazy on the details. She was unable to get up or call for help and lay on the floor overnight. She was brought to the ED the following morning where x-rays showed a left femoral neck fx and orthopedic surgery was consulted. She c/o localized pain at the site but denies injuring anything else.  Past Medical History:  Diagnosis Date  . Anxiety   . Arthritis of foot   . Carpal tunnel syndrome 09/14/2014   Bilateral  . Depression   . Fracture of ankle    no surgery  . Gait disorder 09/06/2014  . GERD (gastroesophageal reflux disease)   . HOH (hard of hearing)    hearing aid, history of tinnitus  . Hypertension   . Macular degeneration, bilateral   . Memory difficulty 09/06/2014  . Ovarian cancer [facility])    s/p surgery and chemothearpy  . Peripheral neuropathy     Past Surgical History:  Procedure Laterality Date  . HIP PINNING  2009  . Ovarian Cancer Debulking   2007    Family History  Problem Relation Age of Onset  . Lung cancer Brother   . Heart attack Father   . Cancer Brother   . Lung cancer Brother     Social History:  reports that she has never smoked. She has never used smokeless tobacco. She reports that she does not drink alcohol or use drugs.  Allergies:  Allergies  Allergen Reactions  . Pregabalin Other (See Comments)    Medications: I have reviewed the patient's current medications.  Results for orders placed or performed during the hospital encounter of 03/04/18 (from the past 48 hour(s))  CBC with Differential     Status: Abnormal   Collection Time: 03/04/18  1:36 PM  Result Value Ref Range   WBC 11.7 (H) 4.0 - 10.5 K/uL   RBC 4.41 3.87 - 5.11 MIL/uL   Hemoglobin 14.0 12.0 - 15.0 g/dL   HCT 40.9 36.0 - 46.0 %   MCV 92.7 78.0 - 100.0 fL   MCH 31.7 26.0  - 34.0 pg   MCHC 34.2 30.0 - 36.0 g/dL   RDW 13.2 11.5 - 15.5 %   Platelets 301 150 - 400 K/uL   Neutrophils Relative % 82 %   Neutro Abs 9.6 (H) 1.7 - 7.7 K/uL   Lymphocytes Relative 10 %   Lymphs Abs 1.1 0.7 - 4.0 K/uL   Monocytes Relative 8 %   Monocytes Absolute 0.9 0.1 - 1.0 K/uL   Eosinophils Relative 0 %   Eosinophils Absolute 0.0 0.0 - 0.7 K/uL   Basophils Relative 0 %   Basophils Absolute 0.0 0.0 - 0.1 K/uL    Comment: Performed at [facility], [address], [place], [place] [address]  Basic metabolic panel     Status: Abnormal   Collection Time: 03/04/18  1:36 PM  Result Value Ref Range   Sodium 132 (L) 135 - 145 mmol/L   Potassium 3.9 3.5 - 5.1 mmol/L   Chloride 98 (L) 101 - 111 mmol/L   CO2 23 22 - 32 mmol/L   Glucose, Bld 129 (H) 65 - 99 mg/dL   BUN 12 6 - 20 mg/dL   Creatinine, Ser 0.72 0.44 - 1.00 mg/dL   Calcium 8.3 (L)  8.9 - 10.3 mg/dL   GFR calc non Af Amer >60 >60 mL/min   GFR calc Af Amer >60 >60 mL/min    Comment: (NOTE) The eGFR has been calculated using the CKD EPI equation. This calculation has not been validated in all clinical situations. eGFR's persistently <60 mL/min signify possible Chronic Kidney Disease.    Anion gap 11 5 - 15    Comment: Performed at [facility] [address] [place], [place] [address]    Dg Chest 1 View  Result Date: 03/04/2018 CLINICAL DATA:  Pain following fall EXAM: CHEST  1 VIEW COMPARISON:  June 15, 2014 FINDINGS: Lungs are clear. Heart size and pulmonary vascularity are normal. No adenopathy. There is aortic atherosclerosis. There is no pneumothorax. No fractures are evident. There is degenerative change in the thoracic spine. IMPRESSION: No edema or consolidation. No pneumothorax. There is aortic atherosclerosis. Aortic Atherosclerosis (ICD10-I70.0). Electronically Signed   By: Lowella Grip III M.D.   On: 03/04/2018 13:33   Dg Hip Unilat W Or Wo Pelvis 2-3 Views Left  Result Date:  03/04/2018 CLINICAL DATA:  Pain following fall EXAM: DG HIP (WITH OR WITHOUT PELVIS) 2-3V LEFT COMPARISON:  None. FINDINGS: Frontal pelvis as well as frontal and lateral left hip images were obtained. There is a fracture of the subcapital femoral neck region on the left with varus angulation at the fracture site. No other fractures are evident. No dislocation. There is a total hip replacement on the right with prosthetic components well-seated. There is underlying osteoporosis. There is mild narrowing of the left hip joint. IMPRESSION: Subcapital femoral neck fracture on the left with varus angulation at the fracture site. Total hip replacement on the right with prosthetic components well-seated. No dislocation. Bones osteoporotic. Electronically Signed   By: Lowella Grip III M.D.   On: 03/04/2018 13:35    Review of Systems  Constitutional: Negative for weight loss.  HENT: Negative for ear discharge, ear pain, hearing loss and tinnitus.   Eyes: Negative for blurred vision, double vision, photophobia and pain.  Respiratory: Negative for cough, sputum production and shortness of breath.   Cardiovascular: Negative for chest pain.  Gastrointestinal: Negative for abdominal pain, nausea and vomiting.  Genitourinary: Negative for dysuria, flank pain, frequency and urgency.  Musculoskeletal: Positive for joint pain (Left hip). Negative for back pain, falls, myalgias and neck pain.  Neurological: Negative for dizziness, tingling, sensory change, focal weakness, loss of consciousness and headaches.  Endo/Heme/Allergies: Does not bruise/bleed easily.  Psychiatric/Behavioral: Negative for depression, memory loss and substance abuse. The patient is not nervous/anxious.    Blood pressure (!) 148/82, pulse 92, resp. rate 12, height 5' 4" (1.626 m), weight 59 kg (130 lb), SpO2 93 %. Physical Exam  Constitutional: She appears well-developed and well-nourished. No distress.  HENT:  Head: Normocephalic and  atraumatic.  Eyes: Conjunctivae are normal. Right eye exhibits no discharge. Left eye exhibits no discharge. No scleral icterus.  Neck: Normal range of motion.  Cardiovascular: Normal rate and regular rhythm.  Respiratory: Effort normal. No respiratory distress.  Musculoskeletal:  LLE No traumatic wounds, ecchymosis, or rash  Mod TTP hip  No knee or ankle effusion  Knee stable to varus/ valgus and anterior/posterior stress  Sens DPN, SPN, TN intact  Motor EHL, ext, flex, evers 5/5  DP 2+, PT 1+, No significant edema  Neurological: She is alert.  Skin: Skin is warm and dry. She is not diaphoretic.  Psychiatric: She has a normal mood and  affect. Her behavior is normal.    Assessment/Plan: Left femoral neck fx -- Will need hip hemi tomorrow with Dr. Haddix. NPO after MN. Multiple medical problems -- Hospitalists to admit, provide clearance, and manage.    Vianne Grieshop J. Per Beagley, PA-C Orthopedic Surgery [phone] 03/04/2018, 3:24 PM  

## 2018-03-04 NOTE — H&P (View-Only) (Signed)
Reason for Consult:Left hip fx Referring Physician: Eugenio Hoes  Peggy Conner is an 84 y.o. female.  HPI: Peggy Conner was at home in her ALF when she suffered what sounds like a mechanical fall although she's a little hazy on the details. She was unable to get up or call for help and lay on the floor overnight. She was brought to the ED the following morning where x-rays showed a left femoral neck fx and orthopedic surgery was consulted. She c/o localized pain at the site but denies injuring anything else.  Past Medical History:  Diagnosis Date  . Anxiety   . Arthritis of foot   . Carpal tunnel syndrome 09/14/2014   Bilateral  . Depression   . Fracture of ankle    no surgery  . Gait disorder 09/06/2014  . GERD (gastroesophageal reflux disease)   . HOH (hard of hearing)    hearing aid, history of tinnitus  . Hypertension   . Macular degeneration, bilateral   . Memory difficulty 09/06/2014  . Ovarian cancer [facility])    s/p surgery and chemothearpy  . Peripheral neuropathy     Past Surgical History:  Procedure Laterality Date  . HIP PINNING  2009  . Ovarian Cancer Debulking   2007    Family History  Problem Relation Age of Onset  . Lung cancer Brother   . Heart attack Father   . Cancer Brother   . Lung cancer Brother     Social History:  reports that she has never smoked. She has never used smokeless tobacco. She reports that she does not drink alcohol or use drugs.  Allergies:  Allergies  Allergen Reactions  . Pregabalin Other (See Comments)    Medications: I have reviewed the patient's current medications.  Results for orders placed or performed during the hospital encounter of 03/04/18 (from the past 48 hour(s))  CBC with Differential     Status: Abnormal   Collection Time: 03/04/18  1:36 PM  Result Value Ref Range   WBC 11.7 (H) 4.0 - 10.5 K/uL   RBC 4.41 3.87 - 5.11 MIL/uL   Hemoglobin 14.0 12.0 - 15.0 g/dL   HCT 40.9 36.0 - 46.0 %   MCV 92.7 78.0 - 100.0 fL   MCH 31.7 26.0  - 34.0 pg   MCHC 34.2 30.0 - 36.0 g/dL   RDW 13.2 11.5 - 15.5 %   Platelets 301 150 - 400 K/uL   Neutrophils Relative % 82 %   Neutro Abs 9.6 (H) 1.7 - 7.7 K/uL   Lymphocytes Relative 10 %   Lymphs Abs 1.1 0.7 - 4.0 K/uL   Monocytes Relative 8 %   Monocytes Absolute 0.9 0.1 - 1.0 K/uL   Eosinophils Relative 0 %   Eosinophils Absolute 0.0 0.0 - 0.7 K/uL   Basophils Relative 0 %   Basophils Absolute 0.0 0.0 - 0.1 K/uL    Comment: Performed at [facility], [address], [place], [place] [address]  Basic metabolic panel     Status: Abnormal   Collection Time: 03/04/18  1:36 PM  Result Value Ref Range   Sodium 132 (L) 135 - 145 mmol/L   Potassium 3.9 3.5 - 5.1 mmol/L   Chloride 98 (L) 101 - 111 mmol/L   CO2 23 22 - 32 mmol/L   Glucose, Bld 129 (H) 65 - 99 mg/dL   BUN 12 6 - 20 mg/dL   Creatinine, Ser 0.72 0.44 - 1.00 mg/dL   Calcium 8.3 (L)  8.9 - 10.3 mg/dL   GFR calc non Af Amer >60 >60 mL/min   GFR calc Af Amer >60 >60 mL/min    Comment: (NOTE) The eGFR has been calculated using the CKD EPI equation. This calculation has not been validated in all clinical situations. eGFR's persistently <60 mL/min signify possible Chronic Kidney Disease.    Anion gap 11 5 - 15    Comment: Performed at [facility] [address] [place], [place] [address]    Dg Chest 1 View  Result Date: 03/04/2018 CLINICAL DATA:  Pain following fall EXAM: CHEST  1 VIEW COMPARISON:  June 15, 2014 FINDINGS: Lungs are clear. Heart size and pulmonary vascularity are normal. No adenopathy. There is aortic atherosclerosis. There is no pneumothorax. No fractures are evident. There is degenerative change in the thoracic spine. IMPRESSION: No edema or consolidation. No pneumothorax. There is aortic atherosclerosis. Aortic Atherosclerosis (ICD10-I70.0). Electronically Signed   By: Lowella Grip III M.D.   On: 03/04/2018 13:33   Dg Hip Unilat W Or Wo Pelvis 2-3 Views Left  Result Date:  03/04/2018 CLINICAL DATA:  Pain following fall EXAM: DG HIP (WITH OR WITHOUT PELVIS) 2-3V LEFT COMPARISON:  None. FINDINGS: Frontal pelvis as well as frontal and lateral left hip images were obtained. There is a fracture of the subcapital femoral neck region on the left with varus angulation at the fracture site. No other fractures are evident. No dislocation. There is a total hip replacement on the right with prosthetic components well-seated. There is underlying osteoporosis. There is mild narrowing of the left hip joint. IMPRESSION: Subcapital femoral neck fracture on the left with varus angulation at the fracture site. Total hip replacement on the right with prosthetic components well-seated. No dislocation. Bones osteoporotic. Electronically Signed   By: Lowella Grip III M.D.   On: 03/04/2018 13:35    Review of Systems  Constitutional: Negative for weight loss.  HENT: Negative for ear discharge, ear pain, hearing loss and tinnitus.   Eyes: Negative for blurred vision, double vision, photophobia and pain.  Respiratory: Negative for cough, sputum production and shortness of breath.   Cardiovascular: Negative for chest pain.  Gastrointestinal: Negative for abdominal pain, nausea and vomiting.  Genitourinary: Negative for dysuria, flank pain, frequency and urgency.  Musculoskeletal: Positive for joint pain (Left hip). Negative for back pain, falls, myalgias and neck pain.  Neurological: Negative for dizziness, tingling, sensory change, focal weakness, loss of consciousness and headaches.  Endo/Heme/Allergies: Does not bruise/bleed easily.  Psychiatric/Behavioral: Negative for depression, memory loss and substance abuse. The patient is not nervous/anxious.    Blood pressure (!) 148/82, pulse 92, resp. rate 12, height 5' 4" (1.626 m), weight 59 kg (130 lb), SpO2 93 %. Physical Exam  Constitutional: She appears well-developed and well-nourished. No distress.  HENT:  Head: Normocephalic and  atraumatic.  Eyes: Conjunctivae are normal. Right eye exhibits no discharge. Left eye exhibits no discharge. No scleral icterus.  Neck: Normal range of motion.  Cardiovascular: Normal rate and regular rhythm.  Respiratory: Effort normal. No respiratory distress.  Musculoskeletal:  LLE No traumatic wounds, ecchymosis, or rash  Mod TTP hip  No knee or ankle effusion  Knee stable to varus/ valgus and anterior/posterior stress  Sens DPN, SPN, TN intact  Motor EHL, ext, flex, evers 5/5  DP 2+, PT 1+, No significant edema  Neurological: She is alert.  Skin: Skin is warm and dry. She is not diaphoretic.  Psychiatric: She has a normal mood and  affect. Her behavior is normal.    Assessment/Plan: Left femoral neck fx -- Will need hip hemi tomorrow with Dr. Haddix. NPO after MN. Multiple medical problems -- Hospitalists to admit, provide clearance, and manage.     J. , PA-C Orthopedic Surgery [phone] 03/04/2018, 3:24 PM  

## 2018-03-05 ENCOUNTER — Encounter (HOSPITAL_COMMUNITY): Payer: Self-pay

## 2018-03-05 ENCOUNTER — Encounter (HOSPITAL_COMMUNITY)
Admission: EM | Disposition: A | Discharge status: Skilled Nursing Facility | Payer: Self-pay | Source: Home / Self Care | Attending: Student in an Organized Health Care Education/Training Program

## 2018-03-05 ENCOUNTER — Inpatient Hospital Stay (HOSPITAL_COMMUNITY): Payer: Medicare Other

## 2018-03-05 ENCOUNTER — Inpatient Hospital Stay (HOSPITAL_COMMUNITY): Payer: Medicare Other | Admitting: Anesthesiology

## 2018-03-05 ENCOUNTER — Other Ambulatory Visit: Payer: Self-pay

## 2018-03-05 DIAGNOSIS — F339 Major depressive disorder, recurrent, unspecified: Secondary | ICD-10-CM

## 2018-03-05 DIAGNOSIS — G3109 Other frontotemporal dementia: Secondary | ICD-10-CM

## 2018-03-05 DIAGNOSIS — Z66 Do not resuscitate: Secondary | ICD-10-CM

## 2018-03-05 DIAGNOSIS — Z79899 Other long term (current) drug therapy: Secondary | ICD-10-CM

## 2018-03-05 DIAGNOSIS — E871 Hypo-osmolality and hyponatremia: Secondary | ICD-10-CM

## 2018-03-05 DIAGNOSIS — Z87310 Personal history of (healed) osteoporosis fracture: Secondary | ICD-10-CM

## 2018-03-05 DIAGNOSIS — Z888 Allergy status to other drugs, medicaments and biological substances status: Secondary | ICD-10-CM

## 2018-03-05 DIAGNOSIS — R32 Unspecified urinary incontinence: Secondary | ICD-10-CM

## 2018-03-05 DIAGNOSIS — M80052A Age-related osteoporosis with current pathological fracture, left femur, initial encounter for fracture: Secondary | ICD-10-CM

## 2018-03-05 HISTORY — PX: HIP ARTHROPLASTY: SHX981

## 2018-03-05 LAB — BASIC METABOLIC PANEL
Anion gap: 10 (ref 5–15)
BUN: 18 mg/dL (ref 6–20)
CO2: 23 mmol/L (ref 22–32)
Calcium: 8.6 mg/dL — ABNORMAL LOW (ref 8.9–10.3)
Chloride: 99 mmol/L — ABNORMAL LOW (ref 101–111)
Creatinine, Ser: 0.69 mg/dL (ref 0.44–1.00)
GFR calc Af Amer: >60 mL/min (ref >60)
GFR calc non Af Amer: >60 mL/min (ref >60)
Glucose, Bld: 90 mg/dL (ref 65–99)
Potassium: 3.6 mmol/L (ref 3.5–5.1)
Sodium: 132 mmol/L — ABNORMAL LOW (ref 135–145)

## 2018-03-05 LAB — SURGICAL PCR SCREEN
MRSA, PCR: NEGATIVE
Staphylococcus aureus: NEGATIVE

## 2018-03-05 SURGERY — HEMIARTHROPLASTY, HIP, DIRECT ANTERIOR APPROACH, FOR FRACTURE
Anesthesia: Spinal | Site: Hip | Laterality: Left

## 2018-03-05 MED ORDER — CHLORHEXIDINE GLUCONATE 4 % EX LIQD
60.0000 mL | Freq: Once | CUTANEOUS | Status: AC
  Administered 2018-03-05: 4 via TOPICAL

## 2018-03-05 MED ORDER — FENTANYL CITRATE (PF) 100 MCG/2ML IJ SOLN
25.0000 ug | INTRAMUSCULAR | Status: DC | PRN
  Administered 2018-03-05: 50 ug via INTRAVENOUS

## 2018-03-05 MED ORDER — FENTANYL CITRATE (PF) 250 MCG/5ML IJ SOLN
INTRAMUSCULAR | Status: AC
Start: 2018-03-05 — End: ?
  Filled 2018-03-05: qty 5

## 2018-03-05 MED ORDER — GLYCOPYRROLATE 0.2 MG/ML IJ SOLN
INTRAMUSCULAR | Status: DC | PRN
  Administered 2018-03-05: 0.4 mg via INTRAVENOUS

## 2018-03-05 MED ORDER — POVIDONE-IODINE 10 % EX SWAB: 2.0000 "application " | Freq: Once | CUTANEOUS | Status: AC

## 2018-03-05 MED ORDER — PHENYLEPHRINE 40 MCG/ML (10ML) SYRINGE FOR IV PUSH (FOR BLOOD PRESSURE SUPPORT)
PREFILLED_SYRINGE | INTRAVENOUS | Status: AC
  Filled 2018-03-05: qty 10

## 2018-03-05 MED ORDER — FENTANYL CITRATE (PF) 100 MCG/2ML IJ SOLN
INTRAMUSCULAR | Status: AC
  Administered 2018-03-05: 50 ug via INTRAVENOUS
  Filled 2018-03-05: qty 2

## 2018-03-05 MED ORDER — PROPOFOL 10 MG/ML IV BOLUS
INTRAVENOUS | Status: AC
  Filled 2018-03-05: qty 20

## 2018-03-05 MED ORDER — PROPOFOL 10 MG/ML IV BOLUS
INTRAVENOUS | Status: DC | PRN
  Administered 2018-03-05: 70 mg via INTRAVENOUS

## 2018-03-05 MED ORDER — 0.9 % SODIUM CHLORIDE (POUR BTL) OPTIME
TOPICAL | Status: DC | PRN
  Administered 2018-03-05: 1000 mL

## 2018-03-05 MED ORDER — POVIDONE-IODINE 10 % EX SWAB
2.0000 "application " | Freq: Once | CUTANEOUS | Status: AC
  Administered 2018-03-05: 2 via TOPICAL

## 2018-03-05 MED ORDER — SODIUM CHLORIDE 0.9 % IR SOLN
Status: DC | PRN
  Administered 2018-03-05: 3000 mL

## 2018-03-05 MED ORDER — FENTANYL CITRATE (PF) 100 MCG/2ML IJ SOLN
INTRAMUSCULAR | Status: DC | PRN
  Administered 2018-03-05 (×4): 50 ug via INTRAVENOUS

## 2018-03-05 MED ORDER — ACETAMINOPHEN 325 MG PO TABS
650.0000 mg | ORAL_TABLET | Freq: Four times a day (QID) | ORAL | Status: DC | PRN
  Administered 2018-03-06 (×2): 650 mg via ORAL
  Filled 2018-03-05 (×2): qty 2

## 2018-03-05 MED ORDER — DEXAMETHASONE SODIUM PHOSPHATE 10 MG/ML IJ SOLN
INTRAMUSCULAR | Status: DC | PRN
  Administered 2018-03-05: 8 mg via INTRAVENOUS

## 2018-03-05 MED ORDER — ROCURONIUM BROMIDE 100 MG/10ML IV SOLN
INTRAVENOUS | Status: DC | PRN
  Administered 2018-03-05: 10 mg via INTRAVENOUS
  Administered 2018-03-05: 40 mg via INTRAVENOUS

## 2018-03-05 MED ORDER — ACETAMINOPHEN 10 MG/ML IV SOLN: 1000.0000 mg | Freq: Once | INTRAVENOUS | Status: DC | PRN

## 2018-03-05 MED ORDER — LIDOCAINE HCL (CARDIAC) 20 MG/ML IV SOLN
INTRAVENOUS | Status: DC | PRN
  Administered 2018-03-05: 60 mg via INTRAVENOUS

## 2018-03-05 MED ORDER — DEXAMETHASONE SODIUM PHOSPHATE 10 MG/ML IJ SOLN
INTRAMUSCULAR | Status: AC
  Filled 2018-03-05: qty 1

## 2018-03-05 MED ORDER — SUCCINYLCHOLINE CHLORIDE 200 MG/10ML IV SOSY
PREFILLED_SYRINGE | INTRAVENOUS | Status: AC
  Filled 2018-03-05: qty 10

## 2018-03-05 MED ORDER — VANCOMYCIN HCL 1000 MG IV SOLR
INTRAVENOUS | Status: AC
  Filled 2018-03-05: qty 1000

## 2018-03-05 MED ORDER — CEFAZOLIN SODIUM-DEXTROSE 2-4 GM/100ML-% IV SOLN
2.0000 g | Freq: Three times a day (TID) | INTRAVENOUS | Status: AC
  Administered 2018-03-05 – 2018-03-06 (×3): 2 g via INTRAVENOUS
  Filled 2018-03-05 (×3): qty 100

## 2018-03-05 MED ORDER — CHLORHEXIDINE GLUCONATE 4 % EX LIQD: 60.0000 mL | Freq: Once | CUTANEOUS | Status: AC

## 2018-03-05 MED ORDER — ONDANSETRON HCL 4 MG/2ML IJ SOLN
INTRAMUSCULAR | Status: AC
  Filled 2018-03-05: qty 2

## 2018-03-05 MED ORDER — ONDANSETRON HCL 4 MG/2ML IJ SOLN
4.0000 mg | Freq: Once | INTRAMUSCULAR | Status: AC | PRN
  Administered 2018-03-05: 4 mg via INTRAVENOUS

## 2018-03-05 MED ORDER — VANCOMYCIN HCL 1000 MG IV SOLR
INTRAVENOUS | Status: DC | PRN
  Administered 2018-03-05: 1000 mg via TOPICAL

## 2018-03-05 MED ORDER — CEFAZOLIN SODIUM-DEXTROSE 2-4 GM/100ML-% IV SOLN
2.0000 g | INTRAVENOUS | Status: AC
  Administered 2018-03-05: 2 g via INTRAVENOUS
  Filled 2018-03-05 (×2): qty 100

## 2018-03-05 MED ORDER — LACTATED RINGERS IV SOLN
INTRAVENOUS | Status: DC
  Administered 2018-03-05: 50 mL/h via INTRAVENOUS
  Administered 2018-03-05 (×2): via INTRAVENOUS

## 2018-03-05 MED ORDER — NEOSTIGMINE METHYLSULFATE 10 MG/10ML IV SOLN
INTRAVENOUS | Status: DC | PRN
  Administered 2018-03-05: 3 mg via INTRAVENOUS

## 2018-03-05 MED ORDER — PHENYLEPHRINE HCL 10 MG/ML IJ SOLN
INTRAVENOUS | Status: DC | PRN
  Administered 2018-03-05: 25 ug/min via INTRAVENOUS

## 2018-03-05 MED ORDER — LIDOCAINE HCL (CARDIAC) 20 MG/ML IV SOLN
INTRAVENOUS | Status: AC
  Filled 2018-03-05: qty 5

## 2018-03-05 MED ORDER — NEOSTIGMINE METHYLSULFATE 5 MG/5ML IV SOSY
PREFILLED_SYRINGE | INTRAVENOUS | Status: AC
  Filled 2018-03-05: qty 5

## 2018-03-05 SURGICAL SUPPLY — 62 items
ADH SKN CLS APL DERMABOND .7 (GAUZE/BANDAGES/DRESSINGS) ×1
BLADE SAW SAG 73X25 THK (BLADE) ×1
BLADE SAW SGTL 73X25 THK (BLADE) ×2 IMPLANT
BNDG COHESIVE 6X5 TAN STRL LF (GAUZE/BANDAGES/DRESSINGS) ×5 IMPLANT
BRUSH FEMORAL CANAL (MISCELLANEOUS) ×5 IMPLANT
BRUSH SCRUB SURG 4.25 DISP (MISCELLANEOUS) ×6 IMPLANT
CEMENT BONE SIMPLEX SPEEDSET (Cement) ×4 IMPLANT
CHLORAPREP W/TINT 26ML (MISCELLANEOUS) ×3 IMPLANT
CONNECTOR 5 IN 1 STRAIGHT STRL (MISCELLANEOUS) ×2 IMPLANT
COVER SURGICAL LIGHT HANDLE (MISCELLANEOUS) ×5 IMPLANT
DERMABOND ADVANCED (GAUZE/BANDAGES/DRESSINGS) ×2
DERMABOND ADVANCED .7 DNX12 (GAUZE/BANDAGES/DRESSINGS) ×1 IMPLANT
DRAPE HIP W/POCKET STRL (DRAPE) ×3 IMPLANT
DRAPE INCISE IOBAN 85X60 (DRAPES) ×6 IMPLANT
DRAPE ORTHO SPLIT 77X108 STRL (DRAPES) ×6
DRAPE SURG 17X23 STRL (DRAPES) ×3 IMPLANT
DRAPE SURG ORHT 6 SPLT 77X108 (DRAPES) ×2 IMPLANT
DRAPE U-SHAPE 47X51 STRL (DRAPES) ×3 IMPLANT
DRSG MEPILEX BORDER 4X4 (GAUZE/BANDAGES/DRESSINGS) ×2 IMPLANT
DRSG MEPILEX BORDER 4X8 (GAUZE/BANDAGES/DRESSINGS) ×3 IMPLANT
ELECT BLADE 6.5 EXT (BLADE) IMPLANT
ELECT CAUTERY BLADE 6.4 (BLADE) ×2 IMPLANT
ELECT REM PT RETURN 9FT ADLT (ELECTROSURGICAL) ×3
ELECTRODE REM PT RTRN 9FT ADLT (ELECTROSURGICAL) ×1 IMPLANT
GLOVE BIO SURGEON STRL SZ7.5 (GLOVE) ×9 IMPLANT
GLOVE BIOGEL PI IND STRL 7.5 (GLOVE) ×1 IMPLANT
GLOVE BIOGEL PI INDICATOR 7.5 (GLOVE) ×2
GOWN STRL REUS W/ TWL LRG LVL3 (GOWN DISPOSABLE) ×2 IMPLANT
GOWN STRL REUS W/TWL LRG LVL3 (GOWN DISPOSABLE) ×6
HANDPIECE INTERPULSE COAX TIP (DISPOSABLE) ×3
HEAD MODULAR ENDO (Orthopedic Implant) ×3 IMPLANT
HEAD UNPLR 46XMDLR STRL HIP (Orthopedic Implant) IMPLANT
KIT BASIN OR (CUSTOM PROCEDURE TRAY) ×3 IMPLANT
KIT TURNOVER KIT B (KITS) ×3 IMPLANT
MANIFOLD NEPTUNE II (INSTRUMENTS) ×3 IMPLANT
NDL 1/2 CIR MAYO (NEEDLE) ×1 IMPLANT
NEEDLE 1/2 CIR MAYO (NEEDLE) ×3 IMPLANT
NS IRRIG 1000ML POUR BTL (IV SOLUTION) ×3 IMPLANT
PACK TOTAL JOINT (CUSTOM PROCEDURE TRAY) ×3 IMPLANT
PAD ARMBOARD 7.5X6 YLW CONV (MISCELLANEOUS) ×6 IMPLANT
PASSER SUT SWANSON 36MM LOOP (INSTRUMENTS) ×2 IMPLANT
PILLOW ABDUCTION HIP (SOFTGOODS) ×2 IMPLANT
PRESSURIZER FEMORAL UNIV (MISCELLANEOUS) ×3 IMPLANT
SET HNDPC FAN SPRY TIP SCT (DISPOSABLE) ×1 IMPLANT
SLEEVE UNITRAX V40 (Orthopedic Implant) ×3 IMPLANT
SLEEVE UNITRAX V40 +4 (Orthopedic Implant) IMPLANT
SPACER OSTEO DISTAL (Orthopedic Implant) ×2 IMPLANT
STAPLER VISISTAT 35W (STAPLE) ×3 IMPLANT
STEM HIP ACCOLADE SZ4 35X137 (Stem) ×2 IMPLANT
STOCKINETTE IMPERVIOUS LG (DRAPES) ×3 IMPLANT
SUT MNCRL AB 3-0 PS2 18 (SUTURE) ×3 IMPLANT
SUT VIC AB 0 CT1 27 (SUTURE) ×6
SUT VIC AB 0 CT1 27XBRD ANBCTR (SUTURE) ×2 IMPLANT
SUT VIC AB 1 CT1 27 (SUTURE) ×21
SUT VIC AB 1 CT1 27XBRD ANBCTR (SUTURE) IMPLANT
SUT VIC AB 1 CTX 18 (SUTURE) ×3 IMPLANT
SUT VIC AB 2-0 CT1 27 (SUTURE) ×6
SUT VIC AB 2-0 CT1 TAPERPNT 27 (SUTURE) ×2 IMPLANT
TOWEL GREEN STERILE (TOWEL DISPOSABLE) ×2 IMPLANT
TOWER CARTRIDGE SMART MIX (DISPOSABLE) ×3 IMPLANT
WATER STERILE IRR 1000ML POUR (IV SOLUTION) ×1 IMPLANT
YANKAUER SUCT BULB TIP NO VENT (SUCTIONS) ×2 IMPLANT

## 2018-03-05 NOTE — Progress Notes (Signed)
   03/05/18 1000  OT Visit Information  Last OT Received On 03/05/18  Reason Eval/Treat Not Completed Other (comment) (Pt is in surgery.)    Plan to reattempt tomorrow.  Tyrone Schimke OTR/L Pager: [phone]

## 2018-03-05 NOTE — Anesthesia Preprocedure Evaluation (Addendum)
Anesthesia Evaluation  Patient identified by MRN, date of birth, ID band Patient awake    Reviewed: Allergy & Precautions, NPO status , Patient's Chart, lab work & pertinent test results  Airway Mallampati: II  TM Distance: >3 FB Neck ROM: Full    Dental no notable dental hx.    Pulmonary neg pulmonary ROS,    Pulmonary exam normal breath sounds clear to auscultation       Cardiovascular Exercise Tolerance: Poor hypertension, Normal cardiovascular exam Rhythm:Regular Rate:Normal     Neuro/Psych Dementia negative neurological ROS     GI/Hepatic negative GI ROS, Neg liver ROS, GERD  ,  Endo/Other  negative endocrine ROS  Renal/GU negative Renal ROS     Musculoskeletal negative musculoskeletal ROS (+)   Abdominal   Peds  Hematology negative hematology ROS (+)   Anesthesia Other Findings   Reproductive/Obstetrics                            Anesthesia Physical Anesthesia Plan  ASA: III  Anesthesia Plan: General   Post-op Pain Management:    Induction:   PONV Risk Score and Plan: Treatment may vary due to age or medical condition  Airway Management Planned: Oral ETT  Additional Equipment:   Intra-op Plan:   Post-operative Plan:   Informed Consent: I have reviewed the patients History and Physical, chart, labs and discussed the procedure including the risks, benefits and alternatives for the proposed anesthesia with the patient or authorized representative who has indicated his/her understanding and acceptance.   Dental advisory given  Plan Discussed with: CRNA  Anesthesia Plan Comments:        Anesthesia Quick Evaluation

## 2018-03-05 NOTE — Progress Notes (Signed)
   Subjective: Patient is resting in her bed the PACU following surgery.  She denied pain, chest pain, leg pain stating that she was comfortable and in no acute distress.  Objective:  Vital signs in last 24 hours: Vitals:   03/04/18 2000 03/04/18 2127 03/05/18 0040 03/05/18 0659  BP: 110/77 (!) 144/83  (!) 176/93  Pulse: 99 97  87  Resp: 17 17    Temp:  97.7 F (36.5 C) 98.2 F (36.8 C) 99.1 F (37.3 C)  TempSrc:  Oral Oral Oral  SpO2: 93% 95%    Weight:      Height:       ROS is negative except as per HPI  Physical Exam: General: in no acute distress, conversant, no significant alteration from the prior day Neuro: Alert and oriented x 4 Cardio: RRR, no murmur rubs or gallops Pulmonary: Bilateral lung fields clear to auscultation bilaterally   Assessment/Plan:  Active Problems:   Essential hypertension   Closed left hip fracture, initial encounter (HCC)   Dementia   Hip fracture (HCC)  Assessment: Peggy Conner is a 85 yoF with frontal lobe dementia, HTN, and depression who presented with an acute fall resulting in a left hip fracture.  This appears to have been mechanical fall   Given her underlying dementia a definitive determination of the events is unclear. She was seen by orthopedics who performed surgical repair of left femoral neck fracture with hip arthroplasty.   Closed left hip fractured the procedure well.  Will optimize pain control and balance this with physical therapy beginning 03/06/2018.  Closed left hip fracture, initial encounter: Orthopedics completed hip arthroplasty for the fracture today.  She was seen s/p surgery and appears as to be as alert as before. --Continue pain control wtih IV morphine. Okay to initiate p.o. Pain control when she is able to tolerate p.o. Intake  Dementia: On exam the patient continues to demonstrate confusion which is consistent with her evaluation on admission.  No acute issues -Continue Aricept 5 mg daily  Hyponatremia: This  appears to be a chronic issue.  Patient sodium is again 132 today.  We will continue holding centrally acting medications and monitor her sodium while on inpatient --BMP daily  Depression:  Currently holding Abilify. Patient is denying depressive symptoms at this time.  We will continue to monitor this while we hold her antidepressants.  Essential hypertension: Patient was mildly hypertensive on admission and again today 148/88. -Continue losartan 50 mg daily following surger  Dispo: Anticipated discharge in approximately 2-3 day(s).   Kathi Ludwig, MD 03/05/2018, 8:27 AM Pager: Pager# [phone]

## 2018-03-05 NOTE — Progress Notes (Signed)
PT Cancellation Note  Patient Details Name: Peggy Conner MRN: [id number] DOB: 03-May-1925   Cancelled Treatment:    Reason Eval/Treat Not Completed: Patient at procedure or test/unavailable. Pt currently in OR for surgery. Will initiate PT evaluation tomorrow as able.    Thelma Comp 03/05/2018, 11:24 AM   Rolinda Roan, PT, DPT Acute Rehabilitation Services Pager: [phone]

## 2018-03-05 NOTE — Anesthesia Procedure Notes (Signed)
Procedure Name: Intubation Date/Time: 03/05/2018 10:45 AM Performed by: Lavell Luster, CRNA Pre-anesthesia Checklist: Patient identified, Emergency Drugs available, Suction available, Patient being monitored and Timeout performed Patient Re-evaluated:Patient Re-evaluated prior to induction Oxygen Delivery Method: Circle system utilized Preoxygenation: Pre-oxygenation with 100% oxygen Induction Type: IV induction Ventilation: Mask ventilation without difficulty Laryngoscope Size: Mac, 3 and Glidescope Grade View: Grade I Tube type: Oral Tube size: 7.0 mm Number of attempts: 1 Airway Equipment and Method: Stylet Placement Confirmation: ETT inserted through vocal cords under direct vision,  positive ETCO2 and breath sounds checked- equal and bilateral Secured at: 22 cm Tube secured with: Tape Dental Injury: Teeth and Oropharynx as per pre-operative assessment

## 2018-03-05 NOTE — Op Note (Signed)
OrthopaedicSurgeryOperativeNote ([id number]) Date of Surgery: 03/05/2018  Admit Date: 03/04/2018   Diagnoses: Pre-Op Diagnoses: Left displaced femoral neck fracture  Post-Op Diagnosis: Same  Procedures: CPT 27236-Left hip hemiarthroplasty  Surgeons: Primary: Caetano Oberhaus, Thomasene Lot, MD   Assistant: Ainsley Spinner, PA-C  Location:[facility] OR ROOM 03   AnesthesiaGeneral   Antibiotics:Ancef 2g preop   Tourniquettime:None  ATFTDDUKGURKYHCWCB:762 mL   Complications:None  Specimens:None  Implants: Implant Name Type Inv. Item Serial No. Manufacturer Lot No. LRB No. Used Action  SPACER OSTEO CEMENT - [id number] Orthopedic Implant SPACER OSTEO CEMENT  [employer] [id number] Left 1 Implanted  size #4 [id number] 35x14mm 127 deg hip stem Stem   [employer] [id number] Left 1 Implanted  HEAD MODULAR ENDO - [id number] Orthopedic Implant HEAD MODULAR ENDO  [employer] [id number] Left 1 Implanted  SLEEVE UNITRAX V40 - [id number] Orthopedic Implant Marry Guan  [employer] [id number] Left 1 Implanted    IndicationsforSurgery: 84 year old female with a history of mild dementia. She has a history significant for dementia and was at his assisted living facility when she had a ground-level fall.  She complained of significant left hip pain.  She was brought to the emergency room where x-rays showed a displaced left femoral neck fracture.  Orthopedics was consulted.  She had a previous right hip hemiarthroplasty performed by Dr. Maureen Ralphs a number of years ago.  She had done well from this.  I feel that proceeding with hemiarthroplasty is most appropriate due to the displaced nature of the femoral neck fracture.  I discussed risks and benefits with the patient and her family. Risks discussed included bleeding requiring blood transfusion, bleeding causing a hematoma, dislocation, nonunion, damage to surrounding nerves and blood vessels, pain, hardware prominence or irritation, hardware  failure, stiffness, post-traumatic arthritis and acetabular wear, DVT/PE, compartment syndrome, and even death. The patient and her family agreed to proceed with surgery.  Operative Findings: Left hip hemiarthroplasty performed through posterior approach using [employer] size 4 stem with +4 neck and 46 mm head, cemented   Procedure: The patient was identified in the preoperative holding area. Consent was confirmed with the patient and their family and all questions were answered. The operative extremity was marked after confirmation with the patient. she was then brought back to the operating room by our anesthesia colleagues.  She was placed under general anesthetic.  She was then carefully transferred over to regular OR table.  She was then placed in the lateral decubitus position with the operative side up.  All bony prominences were well-padded.  The beanbag was deflated and the hip was appropriately positioned. The operative extremity was then prepped and draped in usual sterile fashion. A preoperative timeout was performed to verify the patient, the procedure, and the extremity. Preoperative antibiotics were dosed.  I made a standard posterior approach to the hip.  I carried this through skin and subcutaneous tissue.  I identified the IT band and split this in line with my incision.  I then proceeded to split the gluteus maximus in line with my incision.  I identified the greater trochanter and just posterior to this I retracted the gluteus medius and identified the piriformis tendon.  I tagged this and reflected this off the insertion of the greater trochanter.  I then reflected off the obturator internus musculature.  I reflected off the quadratus femoris off the femoral neck as well until I was able to palpate the lesser trochanter.  I then identified the capsule and made a split in line  with the femoral neck and made an inferior and superior flap that I tagged with a #1 Vicryl suture.   I  then performed a femoral neck cut approximally 1 fingerbreadth above the lesser trochanter.  Using the tag capsule high incised all the way up to the acetabulum and the labrum.  I took care not to cut the labrum.  I then used a corkscrew to remove the remaining femoral head.  I sized this and felt that he could trial with a 45 or 46 mm head.  I cleared out the remainder of the ligamentum teres.  I cleaned out all bony fragments of the acetabulum.  There was not a significant amount of wear of the cartilage and I felt that a hemiarthroplasty would be most appropriate. I then used a head trial to see about the suction fit of the acetabulum.  I felt that a 46 mm head would be most appropriate for size.  I then proceeded to deliver the femoral neck into the wound to sequentially broach the canal.    I used a canal finder to enter the canal.  I used lateralizer to make sure that it was a valgus as I was broaching the canal.  I then sequentially broached up from 0 to size 4 stem.  I attempted to place a 5 stem but unfortunately the broach was too large and I felt that proceeding with this would increase the risk of fracture.  I chose to place a size 4 stem.  I prepared the canal for cement.  I placed a cement restrictor down the canal.  We then mixed the cement and then placed cement in the canal. I did not pressurize the cement to prevent any cardiovascular complications.  I then proceeded to place the stem and hold it in place with a slight amount of anteversion while the cement hardened.  I then trialed off the cemented stem.  I felt that a +4 neck with a 46 mm head was most appropriate and provided a good amount of stability.  I removed the trial and then placed the final implants.  I reduced the hip and then tested the stability.  In full extension the patient was able to get to 90 degrees of internal rotation.  And at 90 degrees of flexion she was able to internally rotate to approximately 65 degrees before she  started to sublux.  I felt that she was very stable in this position with the size prosthesis that I chose.  I then proceeded to copiously irrigate the incision and implants with pulsatile lavage.  I placed 2 drill holes through the greater trochanter and passed the tag capsule and the tagged short external rotators.  I tied down the capsule and then placed a #1 Vicryl sutures to close down the capsule interval.  I then tied down the short external rotators.  I had a good posterior repair once I was done.  I placed 1 g of vancomycin powder and then proceeded to close the IT band with #1 Vicryl suture.  The skin was closed with 2-0 Vicryl and 3-0 Monocryl and was sealed with Dermabond.  A sterile dressing of Mepilex was placed.  The patient was the rolled supine and a hip abductor brace was placed.  She was then awoken from anesthesia and taken to the PACU in stable condition.  Post Op Plan/Instructions: Patient will be weightbearing as tolerated.  She will follow posterior hip precautions.  She received Lovenox for DVT prophylaxis.  She received postoperative Ancef.  She will work with physical and occupational therapy while here in the hospital.  I was present and performed the entire surgery.  Ainsley Spinner, PA-C did assist me throughout the case. An assistant was necessary given the difficulty in approach, maintenance of reduction and ability to instrument the fracture.   Katha Hamming, MD [facility]

## 2018-03-05 NOTE — Transfer of Care (Signed)
Immediate Anesthesia Transfer of Care Note  Patient: Peggy Conner  Procedure(s) Performed: ARTHROPLASTY HIP (HEMIARTHROPLASTY) (Left Hip)  Patient Location: PACU  Anesthesia Type:General  Level of Consciousness: awake, alert  and confused  Airway & Oxygen Therapy: Patient connected to face mask oxygen  Post-op Assessment: Post -op Vital signs reviewed and stable  Post vital signs: stable  Last Vitals:  Vitals Value Taken Time  BP 131/98 03/05/2018  1:47 PM  Temp    Pulse 122 03/05/2018  1:47 PM  Resp 18 03/05/2018  1:47 PM  SpO2 100 % 03/05/2018  1:47 PM  Vitals shown include unvalidated device data.  Last Pain:  Vitals:   03/05/18 1347  TempSrc:   PainSc: (P) 0-No pain         Complications: No apparent anesthesia complications

## 2018-03-05 NOTE — Progress Notes (Signed)
   I saw and examined the patient. I reviewed the resident's note and I agree with the resident's findings and plan as documented in the resident's note.  Patient is doing well in the PACU.  Exam is stable.  Pain is well controlled, she is awake and alert.  Getting x-rays currently.  We will see how we are able to mobilize her postoperatively and through the weekend.  We will work on pain control, DVT prophylaxis, treatment of osteoporosis, and management of hyponatremia.  Axel Filler, MD 03/05/2018, 2:15 PM

## 2018-03-05 NOTE — Interval H&P Note (Signed)
History and Physical Interval Note:  03/05/2018 9:57 AM  Peggy Conner  has presented today for surgery, with the diagnosis of LEFT FEMORAL NECK FRACTURE  The various methods of treatment have been discussed with the patient and family. After consideration of risks, benefits and other options for treatment, the patient has consented to  Procedure(s): ARTHROPLASTY HIP (HEMIARTHROPLASTY) (Left) as a surgical intervention .  The patient's history has been reviewed, patient examined, no change in status, stable for surgery.  I have reviewed the patient's chart and labs.  Questions were answered to the patient's satisfaction.     Lennette Bihari P Elycia Woodside

## 2018-03-06 ENCOUNTER — Encounter (HOSPITAL_COMMUNITY): Payer: Self-pay | Admitting: Internal Medicine

## 2018-03-06 DIAGNOSIS — I441 Atrioventricular block, second degree: Secondary | ICD-10-CM

## 2018-03-06 DIAGNOSIS — I10 Essential (primary) hypertension: Secondary | ICD-10-CM

## 2018-03-06 DIAGNOSIS — G301 Alzheimer's disease with late onset: Secondary | ICD-10-CM

## 2018-03-06 DIAGNOSIS — F0280 Dementia in other diseases classified elsewhere without behavioral disturbance: Secondary | ICD-10-CM

## 2018-03-06 LAB — BASIC METABOLIC PANEL
ANION GAP: 8 (ref 5–15)
BUN: 16 mg/dL (ref 6–20)
CHLORIDE: 99 mmol/L — AB (ref 101–111)
CO2: 24 mmol/L (ref 22–32)
CREATININE: 0.63 mg/dL (ref 0.44–1.00)
Calcium: 8.1 mg/dL — ABNORMAL LOW (ref 8.9–10.3)
GFR calc non Af Amer: >60 mL/min (ref >60)
Glucose, Bld: 115 mg/dL — ABNORMAL HIGH (ref 65–99)
POTASSIUM: 4 mmol/L (ref 3.5–5.1)
SODIUM: 131 mmol/L — AB (ref 135–145)

## 2018-03-06 LAB — CBC
HEMATOCRIT: 33.1 % — AB (ref 36.0–46.0)
HEMOGLOBIN: 11.2 g/dL — AB (ref 12.0–15.0)
MCH: 31.8 pg (ref 26.0–34.0)
MCHC: 33.8 g/dL (ref 30.0–36.0)
MCV: 94 fL (ref 78.0–100.0)
Platelets: 251 10*3/uL (ref 150–400)
RBC: 3.52 MIL/uL — AB (ref 3.87–5.11)
RDW: 13.4 % (ref 11.5–15.5)
WBC: 9.2 10*3/uL (ref 4.0–10.5)

## 2018-03-06 LAB — URINALYSIS, ROUTINE W REFLEX MICROSCOPIC
BILIRUBIN URINE: NEGATIVE
Glucose, UA: 50 mg/dL — AB
Hgb urine dipstick: NEGATIVE
Ketones, ur: 5 mg/dL — AB
LEUKOCYTES UA: NEGATIVE
NITRITE: NEGATIVE
PH: 5 (ref 5.0–8.0)
Protein, ur: NEGATIVE mg/dL
SPECIFIC GRAVITY, URINE: 1.023 (ref 1.005–1.030)

## 2018-03-06 LAB — CBC WITH DIFFERENTIAL/PLATELET
BASOS ABS: 0 10*3/uL (ref 0.0–0.1)
Basophils Relative: 0 %
EOS ABS: 0 10*3/uL (ref 0.0–0.7)
EOS PCT: 0 %
HCT: 34.2 % — ABNORMAL LOW (ref 36.0–46.0)
Hemoglobin: 11.3 g/dL — ABNORMAL LOW (ref 12.0–15.0)
LYMPHS ABS: 1.6 10*3/uL (ref 0.7–4.0)
LYMPHS PCT: 13 %
MCH: 31.1 pg (ref 26.0–34.0)
MCHC: 33 g/dL (ref 30.0–36.0)
MCV: 94.2 fL (ref 78.0–100.0)
Monocytes Absolute: 1.7 10*3/uL — ABNORMAL HIGH (ref 0.1–1.0)
Monocytes Relative: 14 %
Neutro Abs: 8.9 10*3/uL — ABNORMAL HIGH (ref 1.7–7.7)
Neutrophils Relative %: 73 %
PLATELETS: 262 10*3/uL (ref 150–400)
RBC: 3.63 MIL/uL — AB (ref 3.87–5.11)
RDW: 12.9 % (ref 11.5–15.5)
WBC: 12.2 10*3/uL — AB (ref 4.0–10.5)

## 2018-03-06 LAB — GLUCOSE, CAPILLARY: Glucose-Capillary: 205 mg/dL — ABNORMAL HIGH (ref 65–99)

## 2018-03-06 LAB — OSMOLALITY, URINE: Osmolality, Ur: 703 mOsm/kg (ref 300–900)

## 2018-03-06 MED ORDER — SODIUM CHLORIDE 0.9 % IV BOLUS: 250.0000 mL | Freq: Once | INTRAVENOUS | Status: DC | PRN

## 2018-03-06 MED ORDER — ENSURE ENLIVE PO LIQD
237.0000 mL | Freq: Two times a day (BID) | ORAL | Status: DC
  Administered 2018-03-06 – 2018-03-09 (×5): 237 mL via ORAL

## 2018-03-06 MED ORDER — SENNA 8.6 MG PO TABS: 1.0000 | ORAL_TABLET | Freq: Every day | ORAL | Status: DC

## 2018-03-06 MED ORDER — ENOXAPARIN SODIUM 40 MG/0.4ML ~~LOC~~ SOLN
40.0000 mg | SUBCUTANEOUS | Status: DC
  Administered 2018-03-06 – 2018-03-09 (×4): 40 mg via SUBCUTANEOUS
  Filled 2018-03-06 (×4): qty 0.4

## 2018-03-06 MED ORDER — ONDANSETRON HCL 4 MG/2ML IJ SOLN
INTRAMUSCULAR | Status: AC
  Filled 2018-03-06: qty 2

## 2018-03-06 MED ORDER — ONDANSETRON HCL 4 MG/2ML IJ SOLN
4.0000 mg | Freq: Once | INTRAMUSCULAR | Status: AC
  Administered 2018-03-06: 4 mg via INTRAVENOUS

## 2018-03-06 NOTE — Progress Notes (Signed)
[facility] (OT[facility])  1 Day Post-Op Procedure(s) (LRB): ARTHROPLASTY HIP (HEMIARTHROPLASTY) (Left)  Subjective: Patient reports pain as well controlled. Resting but conversant when awakened..    Objective: Current Vitals Blood pressure (!) 144/82, pulse 82, temperature 98.7 F (37.1 C), temperature source Oral, resp. rate 16, height 5\' 4"  (1.626 m), weight 59 kg (130 lb), SpO2 93 %. Vital signs in last 24 hours: Temp:  [97.2 F (36.2 C)-99.2 F (37.3 C)] 98.7 F (37.1 C) (04/06 0705) Pulse Rate:  [82-123] 82 (04/06 0705) Resp:  [10-18] 16 (04/05 2027) BP: (131-155)/(76-98) 144/82 (04/06 0705) SpO2:  [93 %-100 %] 93 % (04/05 2027)  Intake/Output from previous day: 04/05 0701 - 04/06 0700 In: 3008.8 [I.V.:2808.8; IV Piggyback:200] Out: 700 [Urine:500; Blood:200]  LABS Recent Labs    03/04/18 1336 03/06/18 0429  HGB 14.0 11.2*   Recent Labs    03/04/18 1336 03/06/18 0429  WBC 11.7* 9.2  RBC 4.41 3.52*  HCT 40.9 33.1*  PLT 301 251   Recent Labs    03/05/18 0528 03/06/18 0429  NA 132* 131*  K 3.6 4.0  CL 99* 99*  CO2 23 24  BUN 18 16  CREATININE 0.69 0.63  GLUCOSE 90 115*  CALCIUM 8.6* 8.1*   No results for input(s): LABPT, INR in the last 72 hours.   Physical Exam  LLE  Dressing intact, clean, dry  Edema/ swelling controlled  Sens: DPN, SPN, TN intact  Motor: EHL, FHL, and lessor toe ext and flex all intact  Brisk cap refill, warm to touch  Assessment/Plan: 1 Day Post-Op Procedure(s) (LRB): ARTHROPLASTY HIP (HEMIARTHROPLASTY) (Left) 1. PT/OT WBAT with post hip precautions 2. DVT proph Heparin 3. F/u 8-14 days; D/c planning  Altamese [place], MD [facility] [phone] (p)

## 2018-03-06 NOTE — Significant Event (Signed)
Rapid Response Event Note RN called for pt unresponsive Overview: Time Called: 2334 Arrival Time: 3568 Event Type: Neurologic  Initial Focused Assessment: On my arrival pt sitting upright in bed, pale, warm to touch, diaphoretic, vomiting small amount of clear colored emesis. Pt alert and oriented x4. This event happened as RN was assisting her from the chair to the bed. BP 154/64 HR 77 RR 16 96% 4L [place], 95% 2L [place]  Dr. Hetty Ely to bedside, new orders placed and completed.   Interventions: CBC, waiting results NS 250 ml bolus EKG- unremarkable Zofran 4mg  IVP CBG 205 Plan of Care (if not transferred): Continue to monitor pt call RRT for any assistance. Joaquim Lai RN made aware Event Summary: Name of Physician Notified: Dr. Hetty Ely  at 1420    at    Outcome: Stayed in room and stabalized     [place], [place]

## 2018-03-06 NOTE — Progress Notes (Addendum)
Called to Peggy Conner after she had a syncopal episode <30 secondas while moving from chair to bed. She does not remember the episode. She is feeling nauseous at this time and actively vomiting in the room. NO other symptoms such as chest pain or difficulty  She looks pale and lethargic. No respiratory distress, lungs are clear to auscultation. Regular rate and rhythm. Left lower quadrant tenderness. Dressings are clean, dry and intact.  Tele during the episode showed sinus bradycardia with a non conducting P wave. No prior arrhythmia alarms. Morning CBC noted hgb 11 down from 14 prior to hip arthroplasty yesterday.  I am concerned for vasovagal syncope possibly secondary to acute blood loss intraoperative.   - Bolus 250 cc NS given  - zofran for nausea  - Stat follow up CBC  - continue tele monitoring,  vital sign checks   3:38 ADDENDUM: Received a call from nurse that central telemetry observed second degree AV block STAT EKG obtained - sinus rhythm without evidence of PR prolongation - vitals stable  - hold donepezil  - consult cardiology

## 2018-03-06 NOTE — Progress Notes (Signed)
Patient had a syncopal episode while transferring from the chair to the bed. O2 started VSS, rapis response made aware. cbg checked and recorded. After the episode , patient was alert and oriented but unable to recall what happen. RR nurse at bedside, MD paged and went to check patient.

## 2018-03-06 NOTE — Progress Notes (Signed)
Tele called that patient had episode of second degree heart block.. Md made aware, 12 lead ekg done. Md in for rounds.

## 2018-03-06 NOTE — Evaluation (Signed)
Occupational Therapy Evaluation Patient Details Name: Peggy Conner MRN: [id number] DOB: [birth year] Today's Date: 03/06/2018    History of Present Illness Pt is a 84 y/o female s/p L hemi arthroplasty (posterior hip precautions). Pt has a past medical history including Anxiety, Arthritis of foot, Carpal tunnel syndrome (09/14/2014), Dementia, Depression, Fracture of ankle, Gait disorder (09/06/2014), GERD, HOH, HTN, Macular degeneration, Ovarian cancer, and Peripheral neuropathy   Clinical Impression   PTA Pt was mod I for mobility with RW (not often used) and ADL (per Pt report) Pt is currently max A +2 for bed mobility, max A +2 to total A +2 for SPT for BSC and recliner. TOtal A for LB ADL at this time. Pt educated on posterior precautions and provided handout. OT will continue to follow in the acute setting and Pt will require SNF level rehab to maximize safety and independence in ADL and functional transfers. Next session to continue to reinforce posterior hip precautions during bed mobility and functional transfers. Potentially work on BUE strength to assist with transfers.     Follow Up Recommendations  SNF;Supervision/Assistance - 24 hour    Equipment Recommendations  Other (comment)(defer to next venue)    Recommendations for Other Services       Precautions / Restrictions Precautions Precautions: Posterior Hip Precaution Booklet Issued: Yes (comment) Precaution Comments: reviewed in full Restrictions Weight Bearing Restrictions: Yes LLE Weight Bearing: Weight bearing as tolerated      Mobility Bed Mobility Overal bed mobility: Needs Assistance Bed Mobility: Supine to Sit     Supine to sit: Max assist;+2 for physical assistance;+2 for safety/equipment;HOB elevated     General bed mobility comments: use of bed pad to assist, assist for trunk elevation and management of BLE  Transfers Overall transfer level: Needs assistance Equipment used: Rolling walker (2 wheeled);2  person hand held assist Transfers: Sit to/from [facility] Sit to Stand: Max assist;+2 safety/equipment;+2 physical assistance Stand pivot transfers: Max assist;+2 physical assistance;+2 safety/equipment       General transfer comment: A HHA face to face from bed > BSC > recliner. Completed sit <> stand from [facility] and ret to [facility] post-peri care and Pt is currently unsafe to use RW at this time    Balance Overall balance assessment: Needs assistance;History of Falls Sitting-balance support: Bilateral upper extremity supported;Feet supported Sitting balance-Leahy Scale: Fair Sitting balance - Comments: min guard EOB   Standing balance support: Bilateral upper extremity supported;During functional activity Standing balance-Leahy Scale: Poor Standing balance comment: reliant on external supports                           ADL either performed or assessed with clinical judgement   ADL Overall ADL's : Needs assistance/impaired Eating/Feeding: Modified independent;Sitting   Grooming: Set up;Sitting   Upper Body Bathing: Moderate assistance   Lower Body Bathing: Total assistance   Upper Body Dressing : Set up   Lower Body Dressing: Maximal assistance;+2 for physical assistance;+2 for safety/equipment   Toilet Transfer: Maximal assistance;+2 for physical assistance;+2 for safety/equipment;Stand-pivot;BSC Toilet Transfer Details (indicate cue type and reason): 2 HHA Toileting- Clothing Manipulation and Hygiene: Maximal assistance;+2 for physical assistance;+2 for safety/equipment;Sit to/from stand Toileting - Clothing Manipulation Details (indicate cue type and reason): total A for standing and assist for peri care     Functional mobility during ADLs: Maximal assistance;+2 for physical assistance;+2 for safety/equipment(2 person HHA, SPT only)       Vision Baseline Vision/History: Wears  glasses Wears Glasses: At all times Patient Visual Report: No change  from baseline       Perception     Praxis      Pertinent Vitals/Pain Pain Assessment: 0-10 Pain Score: 3  Pain Location: L hip Pain Descriptors / Indicators: Discomfort;Sore Pain Intervention(s): Monitored during session;Repositioned     Hand Dominance Right   Extremity/Trunk Assessment Upper Extremity Assessment Upper Extremity Assessment: Generalized weakness   Lower Extremity Assessment Lower Extremity Assessment: LLE deficits/detail;Generalized weakness   Cervical / Trunk Assessment Cervical / Trunk Assessment: Kyphotic   Communication Communication Communication: HOH   Cognition Arousal/Alertness: Awake/alert Behavior During Therapy: WFL for tasks assessed/performed Overall Cognitive Status: History of cognitive impairments - at baseline                                     General Comments       Exercises     Shoulder Instructions      Home Living Family/patient expects to be discharged to:: Skilled nursing facility                                        Prior Functioning/Environment Level of Independence: Independent with assistive device(s)        Comments: supposed to use RW        OT Problem List: Decreased strength;Decreased range of motion;Decreased activity tolerance;Impaired balance (sitting and/or standing);Decreased knowledge of use of DME or AE;Decreased knowledge of precautions;Pain      OT Treatment/Interventions: Self-care/ADL training;DME and/or AE instruction;Therapeutic activities;Patient/family education;Balance training    OT Goals(Current goals can be found in the care plan section) Acute Rehab OT Goals Patient Stated Goal: to get better OT Goal Formulation: With patient Time For Goal Achievement: 03/20/18 Potential to Achieve Goals: Good ADL Goals Pt Will Transfer to Toilet: with max assist;stand pivot transfer;bedside commode Pt Will Perform Toileting - Clothing Manipulation and hygiene:  sitting/lateral leans;with supervision Additional ADL Goal #1: Pt will perform bed mobility at mod A prior to engaging in ADL activity Additional ADL Goal #2: Pt will recall 3/3 posterior hip precautions at mod I level  OT Frequency: Min 1X/week   Barriers to D/C:            Co-evaluation PT/OT/SLP Co-Evaluation/Treatment: Yes Reason for Co-Treatment: Complexity of the patient's impairments (multi-system involvement);Necessary to address cognition/behavior during functional activity;For patient/therapist safety;To address functional/ADL transfers PT goals addressed during session: Mobility/safety with mobility;Balance;Proper use of DME OT goals addressed during session: ADL's and self-care;Proper use of Adaptive equipment and DME      AM-PAC PT "6 Clicks" Daily Activity     Outcome Measure Help from another person eating meals?: None Help from another person taking care of personal grooming?: A Little Help from another person toileting, which includes using toliet, bedpan, or urinal?: Total Help from another person bathing (including washing, rinsing, drying)?: A Lot Help from another person to put on and taking off regular upper body clothing?: A Lot Help from another person to put on and taking off regular lower body clothing?: Total 6 Click Score: 13   End of Session Equipment Utilized During Treatment: Gait belt;Rolling walker Nurse Communication: Mobility status;Precautions  Activity Tolerance: Patient tolerated treatment well Patient left: in chair;with call bell/phone within reach;with nursing/sitter in room  OT Visit Diagnosis: Unsteadiness on feet (  R26.81);Other abnormalities of gait and mobility (R26.89);Repeated falls (R29.6);Muscle weakness (generalized) (M62.81);History of falling (Z91.81);Pain Pain - Right/Left: Left Pain - part of body: Hip                Time: 9643-8381 OT Time Calculation (min): 46 min Charges:  OT General Charges $OT Visit: 1 Visit OT  Evaluation $OT Eval Moderate Complexity: 1 Mod OT Treatments $Self Care/Home Management : 8-22 mins G-Codes:     Hulda Humphrey OTR/L [place] 03/06/2018, 12:51 PM

## 2018-03-06 NOTE — Consult Note (Signed)
ELECTROPHYSIOLOGY CONSULT NOTE    Patient ID: Peggy Conner MRN: [id number], DOB/AGE: Mar 30, 1925 84 y.o.  Admit date: 03/04/2018 Date of Consult: 03/06/2018  Primary Physician: Nickola Major, MD Primary Cardiologist: none  Patient Profile: Peggy Conner is a 84 y.o. female with a history of dementia and HTN who is being seen today for the evaluation of transient second degree AV block at the request of Dr Daron Offer.  HPI:  Peggy Conner is a 84 y.o. female was admitted after being found down 03/04/18.  The details of her fall are note known.  She was found to have a hip fracture.  In this setting, per EMS report, she had brief hypotensive event with syncope.  She was not on a heart monitor at that time. She was admitted to [facility] and underwent hip surgery yesterday.  She is doing reasonably well.  She has nausea and has been retching today. She has been observed to have transient second degree AV block today without symptoms for which cardiology has been consulted.  She denies chest pain, palpitations, dyspnea, PND, orthopnea,  edema, weight gain, or early satiety.  Past Medical History:  Diagnosis Date  . Anxiety   . Arthritis of foot   . Carpal tunnel syndrome 09/14/2014   Bilateral  . Dementia   . Depression   . Fracture of ankle    no surgery  . Gait disorder 09/06/2014  . GERD (gastroesophageal reflux disease)   . HOH (hard of hearing)    hearing aid, history of tinnitus  . Hypertension   . Macular degeneration, bilateral   . Memory difficulty 09/06/2014  . Ovarian cancer Peggy Conner)    s/p surgery and chemothearpy  . Peripheral neuropathy      Surgical History:  Past Surgical History:  Procedure Laterality Date  . HIP PINNING  2009  . Ovarian Cancer Debulking   2007     Medications Prior to Admission  Medication Sig Dispense Refill Last Dose  . acetaminophen (ARTHRITIS PAIN RELIEF) 650 MG CR tablet Take 650 mg by mouth every 8 (eight) hours as needed for pain.   03/03/2018 at prn    . ARIPiprazole (ABILIFY) 2 MG tablet TAKE 1 TABLET BY MOUTH ONCE DAILY (Patient taking differently: TAKE 2 mg  TABLET BY MOUTH ONCE DAILY) 30 tablet 5 03/03/2018 at Unknown time  . benzonatate (TESSALON) 100 MG capsule TAKE 1 CAPSULE BY MOUTH TWICE DAILY AS NEEDED FOR COUGH 30 capsule 2 unknown at prn  . buPROPion (WELLBUTRIN XL) 300 MG 24 hr tablet Take 1 tablet (300 mg total) by mouth daily. 90 tablet 1 03/03/2018 at Unknown time  . docusate sodium (COLACE) 100 MG capsule Take 100 mg by mouth every other day.    03/03/2018 at Unknown time  . donepezil (ARICEPT) 5 MG tablet Take 1 tablet (5 mg total) by mouth at bedtime. 90 tablet 3 03/03/2018 at Unknown time  . ferrous sulfate 325 (65 FE) MG tablet Take 325 mg by mouth every other day.   03/03/2018 at Unknown time  . loratadine (CLARITIN) 10 MG tablet Take 10 mg by mouth daily.   03/03/2018 at Unknown time  . losartan (COZAAR) 100 MG tablet TAKE 1/2 TABLET ONCE A DAY (Patient taking differently: Take 50 mg by mouth daily. TAKE 1/2 TABLET ONCE A DAY) 45 tablet 3 03/03/2018 at Unknown time  . Multiple Vitamins-Minerals (MULTIVITAMIN ADULT PO) Take 1 tablet by mouth daily.   03/03/2018 at Unknown time    Inpatient Medications:  .  enoxaparin (LOVENOX) injection  40 mg Subcutaneous Q24H  . feeding supplement (ENSURE ENLIVE)  237 mL Oral BID BM  . losartan  50 mg Oral Daily  . senna  1 tablet Oral Daily    Allergies:  Allergies  Allergen Reactions  . Pregabalin     UNSPECIFIED REACTION     Social History   Socioeconomic History  . Marital status: Widowed    Spouse name: Not on file  . Number of children: 2  . Years of education: 64  . Highest education level: Not on file  Occupational History  . Occupation: Retired  Scientific laboratory technician  . Financial resource strain: Not on file  . Food insecurity:    Worry: Not on file    Inability: Not on file  . Transportation needs:    Medical: Not on file    Non-medical: Not on file  Tobacco Use  . Smoking  status: Never Smoker  . Smokeless tobacco: Never Used  Substance and Sexual Activity  . Alcohol use: No    Comment: occasional  . Drug use: No  . Sexual activity: Not on file  Lifestyle  . Physical activity:    Days per week: Not on file    Minutes per session: Not on file  . Stress: Not on file  Relationships  . Social connections:    Talks on phone: Not on file    Gets together: Not on file    Attends religious service: Not on file    Active member of club or organization: Not on file    Attends meetings of clubs or organizations: Not on file    Relationship status: Not on file  . Intimate partner violence:    Fear of current or ex partner: Not on file    Emotionally abused: Not on file    Physically abused: Not on file    Forced sexual activity: Not on file  Other Topics Concern  . Not on file  Social History Narrative   05/28/17 Patient lives alone but has caregivers daily.    Patient has 2 children   Patient is retired.    Fun: Play golf, watch golf;    Patient drinks 3 cups of caffeine daily.   Patient is right handed.     Family History  Problem Relation Age of Onset  . Lung cancer Brother   . Heart attack Father   . Cancer Brother   . Lung cancer Brother      Review of Systems: All other systems reviewed and are otherwise negative except as noted above.  Physical Exam: Vitals:   03/05/18 2027 03/06/18 0705 03/06/18 1300 03/06/18 1433  BP: 133/76 (!) 144/82 103/64 (!) 154/64  Pulse: (!) 103 82 72 77  Resp: 16  17 16   Temp: (!) 97.5 F (36.4 C) 98.7 F (37.1 C) 97.8 F (36.6 C)   TempSrc: Axillary Oral Oral   SpO2: 93%  98% 96%  Weight:      Height:        GEN- The patient is elderly and frail appearing, alert but with dementia HEENT: normocephalic, atraumatic; sclera clear, conjunctiva pink; hearing intact; oropharynx clear; neck supple Lungs- Clear to ausculation bilaterally, normal work of breathing.  No wheezes, rales, rhonchi Heart- Regular  rate and rhythm, no murmurs, rubs or gallops GI- soft, non-tender, non-distended, bowel sounds present Extremities- no clubbing, cyanosis, or edema; DP/PT/radial pulses 2+ bilaterally MS- s/p hip surgery Skin- warm and dry, no rash or lesion  Psych- pleasant Neuro- strength and sensation are intact  Labs:   Lab Results  Component Value Date   WBC 12.2 (H) 03/06/2018   HGB 11.3 (L) 03/06/2018   HCT 34.2 (L) 03/06/2018   MCV 94.2 03/06/2018   PLT 262 03/06/2018    Recent Labs  Lab 03/06/18 0429  NA 131*  K 4.0  CL 99*  CO2 24  BUN 16  CREATININE 0.63  CALCIUM 8.1*  GLUCOSE 115*      Radiology/Studies: Dg Chest 1 View  Result Date: 03/04/2018 CLINICAL DATA:  Pain following fall EXAM: CHEST  1 VIEW COMPARISON:  June 15, 2014 FINDINGS: Lungs are clear. Heart size and pulmonary vascularity are normal. No adenopathy. There is aortic atherosclerosis. There is no pneumothorax. No fractures are evident. There is degenerative change in the thoracic spine. IMPRESSION: No edema or consolidation. No pneumothorax. There is aortic atherosclerosis. Aortic Atherosclerosis (ICD10-I70.0). Electronically Signed   By: Lowella Grip III M.D.   On: 03/04/2018 13:33   Dg Knee 1-2 Views Left  Result Date: 03/04/2018 CLINICAL DATA:  Displaced femoral neck fracture. EXAM: LEFT KNEE - 1-2 VIEW COMPARISON:  None. FINDINGS: Two-view exam of the left knee shows no evidence for an acute fracture. No subluxation or dislocation evident although assessment for subluxation limited by positioning. Lateral film shows no substantial joint effusion. IMPRESSION: Negative. Electronically Signed   By: Misty Stanley M.D.   On: 03/04/2018 17:43   Dg Hip Unilat With Pelvis 2-3 Views Left  Result Date: 03/05/2018 CLINICAL DATA:  Left hip fracture EXAM: DG HIP (WITH OR WITHOUT PELVIS) 2-3V LEFT COMPARISON:  03/04/2018 FINDINGS: Interval placement of left hip arthroplasty components projecting in expected location. No  fracture or dislocation. Right hip arthroplasty components partially visualized. Degenerative change in the visualized lower lumbar spine. IMPRESSION: 1. Interval left hip arthroplasty without apparent complication. Electronically Signed   By: Lucrezia Europe M.D.   On: 03/05/2018 15:00   Dg Hip Unilat W Or Wo Pelvis 2-3 Views Left  Result Date: 03/04/2018 CLINICAL DATA:  Pain following fall EXAM: DG HIP (WITH OR WITHOUT PELVIS) 2-3V LEFT COMPARISON:  None. FINDINGS: Frontal pelvis as well as frontal and lateral left hip images were obtained. There is a fracture of the subcapital femoral neck region on the left with varus angulation at the fracture site. No other fractures are evident. No dislocation. There is a total hip replacement on the right with prosthetic components well-seated. There is underlying osteoporosis. There is mild narrowing of the left hip joint. IMPRESSION: Subcapital femoral neck fracture on the left with varus angulation at the fracture site. Total hip replacement on the right with prosthetic components well-seated. No dislocation. Bones osteoporotic. Electronically Signed   By: Lowella Grip III M.D.   On: 03/04/2018 13:35    EKG: sinus rhythm with LAHB (personally reviewed)  TELEMETRY: sinus rhythm with occasional second degree AV block type II with short episode of 2:1 AV block, no prolonged RR Intervals (personally reviewed)   Assessment/Plan: 1.  Mobitz II second degree AV block The patient has second degree AV block.  This is likely due to degenerative conduction system disease.  No reversible causes are found.  She has not been symptomatic today.  Episodes occur in the setting of nausea and retching.   She has significant dementia.  I have discussed possible pacemaker implantation at length with the patient's daughter who is at the bedside.  The patient is DNR.  Given her advanced age, dementia, and  fragility, her daughter is very clear that she would like to avoid PPM at this  time.  A more palliative approach is desired.  I think that this is reasonable. Avoid AV nodal agents in the future.  We will follow for now. Obtain echo to evaluate EF and for valvular disease.  2. HTN Stable No change required today   Signed, Thompson Grayer MD, [facility] 03/06/2018 5:44 PM

## 2018-03-06 NOTE — Progress Notes (Addendum)
   Subjective: Peggy Conner feels that she is doing well today she does not remember the procedure yesterday she is not in pain.  She feels that she would like to try to get out of bed today.  She tolerated breakfast well. Daughter Peggy Conner is at bedside and updated on the plan  Objective:  Vital signs in last 24 hours: Vitals:   03/05/18 1445 03/05/18 1528 03/05/18 2027 03/06/18 0705  BP:  (!) 151/91 133/76 (!) 144/82  Pulse:  (!) 123 (!) 103 82  Resp:  16 16   Temp: (!) 97.2 F (36.2 C) 99.2 F (37.3 C) (!) 97.5 F (36.4 C) 98.7 F (37.1 C)  TempSrc:  Axillary Axillary Oral  SpO2:  99% 93%   Weight:      Height:       General: well appearing, cute distress, resting comfortably in bed Cardiac: Regular rate and rhythm, no murmur appreciated, no peripheral edema Pulmonary: Lungs clear to auscultation no wheezes or rhonchi Gastrointestinal: Soft, nontender, nondistended, no suprapubic tenderness  Assessment/Plan:  Peggy Conner is a 84 year old female with history of frontal dementia, hypertension, depression presented after an acute fall which was mechanical resulting in left hip fracture.  She was taken for left hip hemiarthroplasty 4/5.  We are working on risk factor modification and osteoporosis treatment.   Hip fracture ([place])  Closed left hip fracture, initial encounter ([place]) -Postop day 2 after left hip hemiarthroplasty on 4/5 for displaced femoral neck fracture  - continue PT/OT - morphine PRN and for PT, start senokot daily  - lovenox for DVT prophylaxis - follow up vitamin D  - will need to start bisphosphonate therapy when she is able to sit up in bed   Hyponatremia  - Na 131 today with urine osmolality 700 which may be consistent with ADH - holding home buproprion as this can be associated with SIADH, daughter does not feel that this medication has provided much improvement  - can hold losartan if blood pressure becomes controlled  - fluid restriction to <800 ml/day - continue  to monitor sodium      Essential hypertension - blood pressure controlled 144/80  - continue home losartan at half dose      Dementia - Delirium precautions  - continue home aricept 5 mg daily   Anemia  - Post op hemoglobin 11.2, asymptomatic   - will continue to follow    Dispo: Anticipated discharge pending SNF placement   Ledell Noss, MD 03/06/2018, 10:53 AM Pager: [phone]

## 2018-03-06 NOTE — Evaluation (Signed)
Physical Therapy Evaluation Patient Details Name: Peggy Conner MRN: [id number] DOB: 06-Apr-1925 Today's Date: 03/06/2018   History of Present Illness  Pt is a 84 y/o female s/p L hemi arthroplasty (posterior hip precautions). Pt has a past medical history including Anxiety, Arthritis of foot, Carpal tunnel syndrome (09/14/2014), Dementia, Depression, Fracture of ankle, Gait disorder (09/06/2014), GERD, HOH, HTN, Macular degeneration, Ovarian cancer, and Peripheral neuropathy  Clinical Impression  Patient required max a +2 to transfer to the commode and then to the chair. She erequired max a +2 to transfe rot eh edge of the bed too.  She was unable to take a step despite assist. She would benefit from further skilled therapy to work on transfers as well as rehab at a SNF.     Follow Up Recommendations SNF    Equipment Recommendations       Recommendations for Other Services Rehab consult     Precautions / Restrictions Precautions Precautions: Posterior Hip Precaution Booklet Issued: Yes (comment) Precaution Comments: reviewed in full Restrictions Weight Bearing Restrictions: Yes LLE Weight Bearing: Weight bearing as tolerated      Mobility  Bed Mobility Overal bed mobility: Needs Assistance Bed Mobility: Supine to Sit     Supine to sit: Max assist;+2 for physical assistance;+2 for safety/equipment;HOB elevated     General bed mobility comments: use of bed pad to assist, assist for trunk elevation and management of BLE  Transfers Overall transfer level: Needs assistance Equipment used: Rolling walker (2 wheeled);2 person hand held assist Transfers: Sit to/from [facility] Sit to Stand: Max assist;+2 safety/equipment;+2 physical assistance Stand pivot transfers: Max assist;+2 physical assistance;+2 safety/equipment       General transfer comment: Patient transferd from commode then to chair with max a+2. Patient expressed pain. She stood while being cleaned  withthe walker but was unable to take a step depsite max a.   Ambulation/Gait                Stairs            Wheelchair Mobility    Modified Rankin (Stroke Patients Only)       Balance Overall balance assessment: Needs assistance;History of Falls Sitting-balance support: Bilateral upper extremity supported;Feet supported Sitting balance-Leahy Scale: Fair Sitting balance - Comments: min guard EOB   Standing balance support: Bilateral upper extremity supported;During functional activity Standing balance-Leahy Scale: Poor Standing balance comment: reliant on external supports                             Pertinent Vitals/Pain Pain Assessment: 0-10 Pain Score: 3  Pain Location: L hip Pain Descriptors / Indicators: Discomfort;Sore Pain Intervention(s): Monitored during session    Home Living Family/patient expects to be discharged to:: Skilled nursing facility Living Arrangements: Alone(has help in the mornings) Available Help at Discharge: Family                  Prior Function Level of Independence: Independent with assistive device(s)         Comments: supposed to use RW     Hand Dominance   Dominant Hand: Right    Extremity/Trunk Assessment   Upper Extremity Assessment Upper Extremity Assessment: Defer to OT evaluation    Lower Extremity Assessment Lower Extremity Assessment: LLE deficits/detail;Generalized weakness LLE: Unable to fully assess due to pain    Cervical / Trunk Assessment Cervical / Trunk Assessment: Kyphotic  Communication   Communication: HOH  Cognition  Arousal/Alertness: Awake/alert Behavior During Therapy: WFL for tasks assessed/performed Overall Cognitive Status: History of cognitive impairments - at baseline                                        General Comments      Exercises     Assessment/Plan    PT Assessment    PT Problem List         PT Treatment Interventions       PT Goals (Current goals can be found in the Care Plan section)  Acute Rehab PT Goals Patient Stated Goal: to get better PT Goal Formulation: With patient Time For Goal Achievement: 03/20/18 Potential to Achieve Goals: Good    Frequency Min 5X/week   Barriers to discharge        Co-evaluation   Reason for Co-Treatment: Complexity of the patient's impairments (multi-system involvement) PT goals addressed during session: Mobility/safety with mobility;Balance;Proper use of DME;Strengthening/ROM OT goals addressed during session: ADL's and self-care;Proper use of Adaptive equipment and DME       AM-PAC PT "6 Clicks" Daily Activity  Outcome Measure Difficulty turning over in bed (including adjusting bedclothes, sheets and blankets)?: Unable Difficulty moving from lying on back to sitting on the side of the bed? : Unable Difficulty sitting down on and standing up from a chair with arms (e.g., wheelchair, bedside commode, etc,.)?: Unable Help needed moving to and from a bed to chair (including a wheelchair)?: Total Help needed walking in hospital room?: Total Help needed climbing 3-5 steps with a railing? : Total 6 Click Score: 6    End of Session Equipment Utilized During Treatment: Gait belt Activity Tolerance: Patient limited by pain Patient left: in chair;with bed alarm set;with call bell/phone within reach Nurse Communication: Mobility status PT Visit Diagnosis: Unsteadiness on feet (R26.81);Muscle weakness (generalized) (M62.81);History of falling (Z91.81);Difficulty in walking, not elsewhere classified (R26.2);Pain Pain - Right/Left: Left Pain - part of body: Hip    Time: 9509-3267 PT Time Calculation (min) (ACUTE ONLY): 46 min   Charges:   PT Evaluation $PT Eval Moderate Complexity: 1 Mod     PT G Codes:          Carney Living PT DPT  03/06/2018, 1:16 PM

## 2018-03-07 DIAGNOSIS — F0390 Unspecified dementia without behavioral disturbance: Secondary | ICD-10-CM

## 2018-03-07 DIAGNOSIS — E871 Hypo-osmolality and hyponatremia: Secondary | ICD-10-CM

## 2018-03-07 DIAGNOSIS — D649 Anemia, unspecified: Secondary | ICD-10-CM

## 2018-03-07 DIAGNOSIS — I441 Atrioventricular block, second degree: Secondary | ICD-10-CM

## 2018-03-07 LAB — BASIC METABOLIC PANEL
Anion gap: 7 (ref 5–15)
BUN: 21 mg/dL — AB (ref 6–20)
CO2: 24 mmol/L (ref 22–32)
Calcium: 7.9 mg/dL — ABNORMAL LOW (ref 8.9–10.3)
Chloride: 101 mmol/L (ref 101–111)
Creatinine, Ser: 0.66 mg/dL (ref 0.44–1.00)
GFR calc Af Amer: >60 mL/min (ref >60)
GLUCOSE: 143 mg/dL — AB (ref 65–99)
Potassium: 4.3 mmol/L (ref 3.5–5.1)
Sodium: 132 mmol/L — ABNORMAL LOW (ref 135–145)

## 2018-03-07 LAB — CBC
HCT: 34.9 % — ABNORMAL LOW (ref 36.0–46.0)
Hemoglobin: 11.5 g/dL — ABNORMAL LOW (ref 12.0–15.0)
MCH: 30.8 pg (ref 26.0–34.0)
MCHC: 33 g/dL (ref 30.0–36.0)
MCV: 93.6 fL (ref 78.0–100.0)
PLATELETS: 311 10*3/uL (ref 150–400)
RBC: 3.73 MIL/uL — ABNORMAL LOW (ref 3.87–5.11)
RDW: 13 % (ref 11.5–15.5)
WBC: 16.9 10*3/uL — AB (ref 4.0–10.5)

## 2018-03-07 LAB — C DIFFICILE QUICK SCREEN W PCR REFLEX
C Diff antigen: NEGATIVE
C Diff interpretation: NOT DETECTED
C Diff toxin: NEGATIVE

## 2018-03-07 MED ORDER — ACETAMINOPHEN 325 MG PO TABS
650.0000 mg | ORAL_TABLET | Freq: Four times a day (QID) | ORAL | Status: DC
  Administered 2018-03-07 – 2018-03-09 (×8): 650 mg via ORAL
  Filled 2018-03-07 (×8): qty 2

## 2018-03-07 MED ORDER — SENNA 8.6 MG PO TABS: 1.0000 | ORAL_TABLET | Freq: Every day | ORAL | Status: DC | PRN

## 2018-03-07 MED ORDER — OXYCODONE HCL 5 MG PO TABS: 2.5000 mg | ORAL_TABLET | ORAL | Status: DC | PRN

## 2018-03-07 MED ORDER — LORATADINE 10 MG PO TABS
10.0000 mg | ORAL_TABLET | Freq: Every day | ORAL | Status: DC
  Administered 2018-03-07 – 2018-03-09 (×3): 10 mg via ORAL
  Filled 2018-03-07 (×3): qty 1

## 2018-03-07 NOTE — Progress Notes (Signed)
   Progress Note   Subjective   Doing well today, the patient denies CP or SOB.  No new concerns  Inpatient Medications    Scheduled Meds: . enoxaparin (LOVENOX) injection  40 mg Subcutaneous Q24H  . feeding supplement (ENSURE ENLIVE)  237 mL Oral BID BM  . losartan  50 mg Oral Daily   Continuous Infusions: . sodium chloride 75 mL/hr at 03/07/18 0422  . sodium chloride     PRN Meds: acetaminophen, morphine injection, senna, sodium chloride   Vital Signs    Vitals:   03/06/18 1300 03/06/18 1433 03/06/18 2031 03/07/18 0422  BP: 103/64 (!) 154/64 (!) 142/75 120/69  Pulse: 72 77 95 (!) 102  Resp: 17 16  15   Temp: 97.8 F (36.6 C)  (!) 97.5 F (36.4 C) 98.3 F (36.8 C)  TempSrc: Oral  Oral Oral  SpO2: 98% 96% 98% 99%  Weight:      Height:        Intake/Output Summary (Last 24 hours) at 03/07/2018 1010 Last data filed at 03/06/2018 2000 Gross per 24 hour  Intake -  Output 600 ml  Net -600 ml   Filed Weights   03/04/18 1218  Weight: 130 lb (59 kg)    Telemetry    Sinus rhythm, rare mobitz II second degree AV block episodes - Personally Reviewed  Physical Exam   GEN- The patient is elderly appearing, sleeping but rouses Head- normocephalic, atraumatic Eyes-  Sclera clear, conjunctiva pink Ears- hearing intact Oropharynx- clear Neck- supple, Lungs- Clear to ausculation bilaterally, normal work of breathing Heart- Regular rate and rhythm  GI- soft, NT, ND, + BS Extremities- no clubbing, cyanosis, or edema    Labs    Chemistry Recent Labs  Lab 03/04/18 1336 03/05/18 0528 03/06/18 0429  NA 132* 132* 131*  K 3.9 3.6 4.0  CL 98* 99* 99*  CO2 23 23 24   GLUCOSE 129* 90 115*  BUN 12 18 16   CREATININE 0.72 0.69 0.63  CALCIUM 8.3* 8.6* 8.1*  GFRNONAA >60 >60 >60  GFRAA >60 >60 >60  ANIONGAP 11 10 8      Hematology Recent Labs  Lab 03/04/18 1336 03/06/18 0429 03/06/18 1448  WBC 11.7* 9.2 12.2*  RBC 4.41 3.52* 3.63*  HGB 14.0 11.2* 11.3*  HCT  40.9 33.1* 34.2*  MCV 92.7 94.0 94.2  MCH 31.7 31.8 31.1  MCHC 34.2 33.8 33.0  RDW 13.2 13.4 12.9  PLT 301 251 262    Cardiac EnzymesNo results for input(s): TROPONINI in the last 168 hours. No results for input(s): TROPIPOC in the last 168 hours.      Assessment & Plan    1.  Mobitz II second degree AV block Discussed previously with patients daughter Remains stable, with short and rare episodes Family would like to avoid PPM Can discontinue telemetry if no further intervention is planned  No further intervention unless this progresses  Cardiology team to see as needed while here. Please call with questions.   Thompson Grayer MD, [facility] 03/07/2018 10:10 AM

## 2018-03-07 NOTE — Progress Notes (Signed)
   Subjective: Peggy Conner is feeling well today.  She went into Mobitz type II heart block overnight and was having persistent nausea into the evening but the nausea has resolved this morning and she slept comfortably throughout the night.  She says that her pain is controlled. Daughter Peggy Conner is at bedside and updated on the plan.  She does not wish to proceed with pacemaker placement.  Objective:  Vital signs in last 24 hours: Vitals:   03/06/18 1300 03/06/18 1433 03/06/18 2031 03/07/18 0422  BP: 103/64 (!) 154/64 (!) 142/75 120/69  Pulse: 72 77 95 (!) 102  Resp: 17 16  15   Temp: 97.8 F (36.6 C)  (!) 97.5 F (36.4 C) 98.3 F (36.8 C)  TempSrc: Oral  Oral Oral  SpO2: 98% 96% 98% 99%  Weight:      Height:       General: well appearing, no acute distress, resting comfortably in bed Cardiac: Regular rate and rhythm, no murmur appreciated, no peripheral edema Pulmonary: Lungs clear to auscultation no wheezes or rhonchi Gastrointestinal: Soft, nontender, nondistended, no suprapubic tenderness MSK: dressing over the left hip is clean, dry, and intact   Assessment/Plan:  Peggy Conner is a 84 year old female with history of frontal dementia, hypertension, depression presented after an acute fall which was mechanical resulting in left hip fracture.  She was taken for left hip hemiarthroplasty 4/5.  We are working on risk factor modification and osteoporosis treatment.   Hip fracture ([place])  Closed left hip fracture, initial encounter ([place]) -Postop day 3 after left hip hemiarthroplasty on 4/5 for displaced femoral neck fracture  - continue PT/OT, they have recommended SNF placement - morphine PRN and for PT, continue senokot daily  - lovenox for DVT prophylaxis - follow up vitamin D  - will need to start bisphosphonate therapy   Mobitz Type II AV block  - several episodes over the last day, no prior history but had a syncopal episode when EMS arrived - continue telemetry monitoring  - avoid  nodal blocking medications, continue to monitor electrolytes  - cardiology following, appreciate their recommendations  Hyponatremia  - low sodium with urine osmolality 700 consistent with SIADH - holding home buproprion and losartan  - fluid restriction to <800 ml/day  - continue to monitor sodium      Essential hypertension - blood pressure controlled 120/69  - holding home losartan for hyponatremia      Dementia - Delirium precautions  - continue home aricept 5 mg daily   Anemia  - Post op hemoglobin stable at 11.2 - denying symptoms of anemia   - will continue to follow    Dispo: Anticipated discharge pending SNF placement   Ledell Noss, MD 03/07/2018, 12:29 PM Pager: [phone]

## 2018-03-07 NOTE — Progress Notes (Signed)
Internal Medicine Attending:   I saw and examined the patient. I reviewed the resident's note and I agree with the resident's findings and plan as documented in the resident's note.  Patient feels well today.  Daughter states that patient developed diarrhea yesterday. Patient was also noted to go into Mobitz type II heart block on telemetry yesterday.  Patient was initially admitted with a left hip fracture secondary to mechanical fall and is now status post left hip hemiarthroplasty on April 5th.  Continue with PT/OT as well as pain control.  Patient will need SNF placement on discharge.  Patient will need to start bisphosphonate therapy as an outpatient.  Patient was noted to go into Mobitz type II AV block on telemetry yesterday.  Cardiology follow-up and recommendations appreciated.  Cardiology discussed case with patient's daughter who does not wish to proceed with the pacemaker at this time.  No further workup for now.  Will consider stopping telemetry monitoring.    Patient daughter also states that patient had multiple episodes of diarrhea yesterday (11 occurrences per nursing charting).  Stool for C. difficile was sent which was negative.  We will continue to monitor closely for now.

## 2018-03-07 NOTE — Progress Notes (Signed)
Orthopedic Trauma Service Progress Note   Patient ID: Peggy Conner MRN: [id number] DOB/AGE: 82-18-1926 82 y.o.  Subjective:  Notes reviewed from other services   Pt c/o L hip pain Has not had any pain meds since around 2030 yesterday, which was tylenol   + diarrhea  C.diff negative   Mobilizing slowly with therapy    ROS As above  Objective:   VITALS:   Vitals:   03/06/18 1300 03/06/18 1433 03/06/18 2031 03/07/18 0422  BP: 103/64 (!) 154/64 (!) 142/75 120/69  Pulse: 72 77 95 (!) 102  Resp: 17 16  15   Temp: 97.8 F (36.6 C)  (!) 97.5 F (36.4 C) 98.3 F (36.8 C)  TempSrc: Oral  Oral Oral  SpO2: 98% 96% 98% 99%  Weight:      Height:        Estimated body mass index is 22.31 kg/m as calculated from the following:   Height as of this encounter: 5\' 4"  (1.626 m).   Weight as of this encounter: 59 kg (130 lb).   Intake/Output      04/06 0701 - 04/07 0700 04/07 0701 - 04/08 0700   I.V. (mL/kg)     IV Piggyback     Total Intake(mL/kg)     Urine (mL/kg/hr) 600 (0.4)    Stool 0    Blood     Total Output 600    Net -600         Urine Occurrence 1 x    Stool Occurrence 12 x      LABS  Results for orders placed or performed during the hospital encounter of 03/04/18 (from the past 24 hour(s))  Glucose, capillary     Status: Abnormal   Collection Time: 03/06/18  2:19 PM  Result Value Ref Range   Glucose-Capillary 205 (H) 65 - 99 mg/dL  CBC with Differential/Platelet     Status: Abnormal   Collection Time: 03/06/18  2:48 PM  Result Value Ref Range   WBC 12.2 (H) 4.0 - 10.5 K/uL   RBC 3.63 (L) 3.87 - 5.11 MIL/uL   Hemoglobin 11.3 (L) 12.0 - 15.0 g/dL   HCT 34.2 (L) 36.0 - 46.0 %   MCV 94.2 78.0 - 100.0 fL   MCH 31.1 26.0 - 34.0 pg   MCHC 33.0 30.0 - 36.0 g/dL   RDW 12.9 11.5 - 15.5 %   Platelets 262 150 - 400 K/uL   Neutrophils Relative % 73 %   Neutro Abs 8.9 (H) 1.7 - 7.7 K/uL   Lymphocytes Relative 13 %   Lymphs Abs 1.6 0.7 - 4.0  K/uL   Monocytes Relative 14 %   Monocytes Absolute 1.7 (H) 0.1 - 1.0 K/uL   Eosinophils Relative 0 %   Eosinophils Absolute 0.0 0.0 - 0.7 K/uL   Basophils Relative 0 %   Basophils Absolute 0.0 0.0 - 0.1 K/uL  Urinalysis, Routine w reflex microscopic     Status: Abnormal   Collection Time: 03/06/18  9:01 PM  Result Value Ref Range   Color, Urine AMBER (A) YELLOW   APPearance CLEAR CLEAR   Specific Gravity, Urine 1.023 1.005 - 1.030   pH 5.0 5.0 - 8.0   Glucose, UA 50 (A) NEGATIVE mg/dL   Hgb urine dipstick NEGATIVE NEGATIVE   Bilirubin Urine NEGATIVE NEGATIVE   Ketones, ur 5 (A) NEGATIVE mg/dL   Protein, ur NEGATIVE NEGATIVE mg/dL   Nitrite NEGATIVE NEGATIVE   Leukocytes, UA NEGATIVE NEGATIVE  C difficile quick scan  w PCR reflex     Status: None   Collection Time: 03/07/18 12:51 AM  Result Value Ref Range   C Diff antigen NEGATIVE NEGATIVE   C Diff toxin NEGATIVE NEGATIVE   C Diff interpretation No C. difficile detected.   Basic metabolic panel     Status: Abnormal   Collection Time: 03/07/18  9:13 AM  Result Value Ref Range   Sodium 132 (L) 135 - 145 mmol/L   Potassium 4.3 3.5 - 5.1 mmol/L   Chloride 101 101 - 111 mmol/L   CO2 24 22 - 32 mmol/L   Glucose, Bld 143 (H) 65 - 99 mg/dL   BUN 21 (H) 6 - 20 mg/dL   Creatinine, Ser 0.66 0.44 - 1.00 mg/dL   Calcium 7.9 (L) 8.9 - 10.3 mg/dL   GFR calc non Af Amer >60 >60 mL/min   GFR calc Af Amer >60 >60 mL/min   Anion gap 7 5 - 15  CBC     Status: Abnormal   Collection Time: 03/07/18  9:13 AM  Result Value Ref Range   WBC 16.9 (H) 4.0 - 10.5 K/uL   RBC 3.73 (L) 3.87 - 5.11 MIL/uL   Hemoglobin 11.5 (L) 12.0 - 15.0 g/dL   HCT 34.9 (L) 36.0 - 46.0 %   MCV 93.6 78.0 - 100.0 fL   MCH 30.8 26.0 - 34.0 pg   MCHC 33.0 30.0 - 36.0 g/dL   RDW 13.0 11.5 - 15.5 %   Platelets 311 150 - 400 K/uL     PHYSICAL EXAM:   Gen: awake and alert, pleasant  Ext:       Left Lower Extremity   Dressing C/D/I  Ext warm  No significant  swelling   Ext warm   + DP pulse   DPN, SPN, TN sensation intact  EHL, FHL, AT, PT, peroneals, gastroc motor intact  No DCT  Compartments are soft   Assessment/Plan: 2 Days Post-Op   Active Problems:   Essential hypertension   Closed left hip fracture, initial encounter (HCC)   Dementia   Hip fracture (HCC)   Anemia   Hyponatremia   Mobitz type II atrioventricular block   Anti-infectives (From admission, onward)   Start     Dose/Rate Route Frequency Ordered Stop   03/05/18 1515  ceFAZolin (ANCEF) IVPB 2g/100 mL premix     2 g 200 mL/hr over 30 Minutes Intravenous Every 8 hours 03/05/18 1512 03/06/18 0700   03/05/18 1142  vancomycin (VANCOCIN) powder  Status:  Discontinued       As needed 03/05/18 1145 03/05/18 1342   03/05/18 0915  ceFAZolin (ANCEF) IVPB 2g/100 mL premix     2 g 200 mL/hr over 30 Minutes Intravenous On call to O.R. 03/05/18 1062 03/05/18 1116    .  POD/HD#: 2  84 y/o female s/p fall with L femoral neck fracture  -Left femoral neck fracture s/p L hip hemiarthroplasty   WBAT   Posterior hip precautions   Dressing changes as needed  PT/OT  Ice prn    SNF  - Pain management:  Will schedule tylenol 650 mg po q6h   Dc morphine  Oxy IR 5 mg q4h prn severe pain   - ABL anemia/Hemodynamics  H/H stable  Cbc in am   - Medical issues   Per medicine and cards  - DVT/PE prophylaxis:  lovenox  Recommend lovenox for 30 days post op  - ID:   periop abx completed   -  Metabolic Bone Disease:  Low energy femoral neck/hip fracture suggestive of osteoporosis   Will discuss with family in outpt setting how they would like to proceed  Vitamin D pending   - Activity:  Up with assistance  WBAT L leg   - Dispo:  Continue with therapies  SW consult for SNF   Jari Pigg, PA-C [facility] [phone] Levi Aland (C) 03/07/2018, 1:11 PM

## 2018-03-07 NOTE — NC FL2 (Signed)
[place] MEDICAID FL2 LEVEL OF CARE SCREENING TOOL     IDENTIFICATION  Patient Name: Peggy Conner Birthdate: [birth year] Sex: female Admission Date (Current Location): 03/04/2018  [facility] and [place] Number:  Herbalist and Address:  [facility] [place] [address] [place], [place] [address]      Provider Number: [id number]  Attending Physician Name and Address:  Axel Filler, *  Relative Name and Phone Number:       Current Level of Care: Hospital Recommended Level of Care: [place] Prior Approval Number:    Date Approved/Denied:   PASRR Number: [id number]  Discharge Plan: SNF    Current Diagnoses: Patient Active Problem List   Diagnosis Date Noted  . Anemia 03/07/2018  . Hyponatremia 03/07/2018  . Mobitz type II atrioventricular block 03/07/2018  . Closed left hip fracture, initial encounter ([place]) 03/04/2018  . Dementia 03/04/2018  . Hip fracture ([place]) 03/04/2018  . Ankle fracture, right, closed, initial encounter 01/14/2017  . Foot injury, right, initial encounter 01/03/2017  . Acute upper respiratory infection 11/04/2016  . Depressed mood 08/16/2015  . Cough 12/28/2014  . Fatigue 12/28/2014  . Essential hypertension 12/28/2014  . Carpal tunnel syndrome 09/14/2014  . Memory difficulty 09/06/2014  . Gait disorder 09/06/2014  . 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 Ovarian cancer ([place]) 12/15/2011    Orientation RESPIRATION BLADDER Height & Weight     Self, Situation, Place  O2(Nasal Cannula 2L) Incontinent, External catheter(placed 4/5) Weight: 130 lb (59 kg) Height:  5\' 4"  (162.6 cm)  BEHAVIORAL SYMPTOMS/MOOD NEUROLOGICAL BOWEL NUTRITION STATUS      Incontinent Diet(Regular diet, thin liquids, FLUID RESTRICTION: Less than 800 ml/day)  AMBULATORY STATUS COMMUNICATION OF NEEDS Skin   Extensive Assist Verbally Surgical wounds(Closed incision left hip, silicone dressing)                        Personal Care Assistance Level of Assistance  Bathing, Dressing, Feeding Bathing Assistance: Maximum assistance Feeding assistance: Independent Dressing Assistance: Maximum assistance     Functional Limitations Info  Sight, Hearing, Speech Sight Info: Adequate Hearing Info: Adequate Speech Info: Adequate    SPECIAL CARE FACTORS FREQUENCY  PT (By licensed PT), OT (By licensed OT)     PT Frequency: 5x OT Frequency: 5x            Contractures Contractures Info: Not present    Additional Factors Info  Code Status, Allergies Code Status Info: DNR Allergies Info: Pregabalin           Current Medications (03/07/2018):  This is the current hospital active medication list Current Facility-Administered Medications  Medication Dose Route Frequency Provider Last Rate Last Dose  . acetaminophen (TYLENOL) tablet 650 mg  650 mg Oral Q6H Ainsley Spinner, PA-C   650 mg at 03/07/18 1357  . enoxaparin (LOVENOX) injection 40 mg  40 mg Subcutaneous Q24H Karren Cobble, RPH   40 mg at 03/07/18 1357  . feeding supplement (ENSURE ENLIVE) (ENSURE ENLIVE) liquid 237 mL  237 mL Oral BID BM Ledell Noss, MD   237 mL at 03/07/18 1032  . morphine 4 MG/ML injection 2 mg  2 mg Intravenous Q4H PRN Haddix, Thomasene Lot, MD   2 mg at 03/05/18 0025  . oxyCODONE (Oxy IR/ROXICODONE) immediate release tablet 2.5-5 mg  2.5-5 mg Oral Q4H PRN Ainsley Spinner, PA-C      . senna (SENOKOT) tablet 8.6 mg  1 tablet Oral  Daily PRN Colbert Ewing, MD      . sodium chloride 0.9 % bolus 250 mL  250 mL Intravenous Once PRN Axel Filler, MD         Discharge Medications: Please see discharge summary for a list of discharge medications.  Relevant Imaging Results:  Relevant Lab Results:   Additional Information SSN: [id number]  Eileen Stanford, LCSW

## 2018-03-07 NOTE — Progress Notes (Signed)
Physical Therapy Treatment Patient Details Name: Peggy Conner MRN: [id number] DOB: 11-04-1925 Today's Date: 03/07/2018    History of Present Illness Pt is a 84 y/o female s/p L hemi arthroplasty (posterior hip precautions). Pt has a past medical history including Anxiety, Arthritis of foot, Carpal tunnel syndrome (09/14/2014), Dementia, Depression, Fracture of ankle, Gait disorder (09/06/2014), GERD, HOH, HTN, Macular degeneration, Ovarian cancer, and Peripheral neuropathy    PT Comments    Pt very lethargic this date and difficult to maintain eyes open. RN states she was up a lot last night due to frequent BMs. Pt unable to truly participate due to lethargy. Upon standing pt with BM. RN asked to return pt to bed as she is having freq loose stools and not safe in chair. Pt con't to require maxAx2 for all mobility. Acute PT to con't follow while in hospital.   Follow Up Recommendations  SNF     Equipment Recommendations       Recommendations for Other Services       Precautions / Restrictions Precautions Precautions: Posterior Hip Precaution Booklet Issued: Yes (comment) Precaution Comments: reviewed in full Restrictions Weight Bearing Restrictions: Yes LLE Weight Bearing: Non weight bearing    Mobility  Bed Mobility Overal bed mobility: Needs Assistance Bed Mobility: Supine to Sit;Sit to Supine     Supine to sit: Max assist;+2 for physical assistance Sit to supine: Max assist;+2 for physical assistance   General bed mobility comments: pt with minimal initiation due to leathargy, maxA for LE management and trunk elevation  Transfers Overall transfer level: Needs assistance Equipment used: (2 person lift with gait belt) Transfers: Sit to/from Stand Sit to Stand: Mod assist;+2 physical assistance         General transfer comment: pt maxA to power up and achieve upright posture but unable to achieve trunk extension. completed 2 stand for hygiene s/p BM  Ambulation/Gait             General Gait Details: limited to 4 side steps to [facility] due to frequent loose stools   Stairs            Wheelchair Mobility    Modified Rankin (Stroke Patients Only)       Balance Overall balance assessment: Needs assistance;History of Falls Sitting-balance support: Bilateral upper extremity supported;Feet supported Sitting balance-Leahy Scale: Poor Sitting balance - Comments: R lateral lean, maxA to maintain mindline   Standing balance support: Bilateral upper extremity supported;During functional activity Standing balance-Leahy Scale: Zero Standing balance comment: reliant on external supports                            Cognition Arousal/Alertness: Awake/alert Behavior During Therapy: WFL for tasks assessed/performed Overall Cognitive Status: History of cognitive impairments - at baseline                                        Exercises      General Comments        Pertinent Vitals/Pain Pain Assessment: Faces Faces Pain Scale: Hurts even more Pain Location: L hip Pain Descriptors / Indicators: Discomfort;Sore Pain Intervention(s): Monitored during session    Home Living                      Prior Function            PT  Goals (current goals can now be found in the care plan section) Progress towards PT goals: Progressing toward goals    Frequency    Min 5X/week      PT Plan Current plan remains appropriate    Co-evaluation              AM-PAC PT "6 Clicks" Daily Activity  Outcome Measure  Difficulty turning over in bed (including adjusting bedclothes, sheets and blankets)?: Unable Difficulty moving from lying on back to sitting on the side of the bed? : Unable Difficulty sitting down on and standing up from a chair with arms (e.g., wheelchair, bedside commode, etc,.)?: Unable Help needed moving to and from a bed to chair (including a wheelchair)?: Total Help needed walking in hospital  room?: Total Help needed climbing 3-5 steps with a railing? : Total 6 Click Score: 6    End of Session Equipment Utilized During Treatment: Gait belt Activity Tolerance: Patient limited by pain Patient left: in bed;with nursing/sitter in room Nurse Communication: Mobility status PT Visit Diagnosis: Unsteadiness on feet (R26.81);Muscle weakness (generalized) (M62.81);History of falling (Z91.81);Difficulty in walking, not elsewhere classified (R26.2);Pain Pain - Right/Left: Left Pain - part of body: Hip     Time: 8841-6606 PT Time Calculation (min) (ACUTE ONLY): 20 min  Charges:  $Therapeutic Activity: 8-22 mins                    G Codes:       Kittie Plater, PT, DPT Pager #: [phone] Office #: [phone]    [place] 03/07/2018, 12:12 PM

## 2018-03-08 ENCOUNTER — Inpatient Hospital Stay (HOSPITAL_COMMUNITY): Payer: Medicare Other

## 2018-03-08 DIAGNOSIS — R197 Diarrhea, unspecified: Secondary | ICD-10-CM

## 2018-03-08 DIAGNOSIS — R55 Syncope and collapse: Secondary | ICD-10-CM

## 2018-03-08 DIAGNOSIS — W19XXXA Unspecified fall, initial encounter: Secondary | ICD-10-CM

## 2018-03-08 DIAGNOSIS — D649 Anemia, unspecified: Secondary | ICD-10-CM

## 2018-03-08 DIAGNOSIS — S72002A Fracture of unspecified part of neck of left femur, initial encounter for closed fracture: Secondary | ICD-10-CM

## 2018-03-08 DIAGNOSIS — I361 Nonrheumatic tricuspid (valve) insufficiency: Secondary | ICD-10-CM

## 2018-03-08 LAB — ECHOCARDIOGRAM COMPLETE
HEIGHTINCHES: 64 in
Weight: 2080 oz

## 2018-03-08 LAB — URINE CULTURE: Culture: NO GROWTH

## 2018-03-08 LAB — BASIC METABOLIC PANEL
Anion gap: 9 (ref 5–15)
BUN: 18 mg/dL (ref 6–20)
CALCIUM: 7.7 mg/dL — AB (ref 8.9–10.3)
CO2: 23 mmol/L (ref 22–32)
Chloride: 100 mmol/L — ABNORMAL LOW (ref 101–111)
Creatinine, Ser: 0.55 mg/dL (ref 0.44–1.00)
Glucose, Bld: 100 mg/dL — ABNORMAL HIGH (ref 65–99)
Potassium: 4 mmol/L (ref 3.5–5.1)
SODIUM: 132 mmol/L — AB (ref 135–145)

## 2018-03-08 LAB — CBC
HCT: 31.9 % — ABNORMAL LOW (ref 36.0–46.0)
Hemoglobin: 10.6 g/dL — ABNORMAL LOW (ref 12.0–15.0)
MCH: 31.3 pg (ref 26.0–34.0)
MCHC: 33.2 g/dL (ref 30.0–36.0)
MCV: 94.1 fL (ref 78.0–100.0)
PLATELETS: 300 10*3/uL (ref 150–400)
RBC: 3.39 MIL/uL — AB (ref 3.87–5.11)
RDW: 13 % (ref 11.5–15.5)
WBC: 16 10*3/uL — AB (ref 4.0–10.5)

## 2018-03-08 LAB — VITAMIN D 25 HYDROXY (VIT D DEFICIENCY, FRACTURES): Vit D, 25-Hydroxy: 28 ng/mL — ABNORMAL LOW (ref 30.0–100.0)

## 2018-03-08 MED ORDER — PERFLUTREN LIPID MICROSPHERE
1.0000 mL | INTRAVENOUS | Status: AC | PRN
  Administered 2018-03-08: 2 mL via INTRAVENOUS
  Filled 2018-03-08: qty 10

## 2018-03-08 MED ORDER — VITAMIN D 1000 UNITS PO TABS
1000.0000 [IU] | ORAL_TABLET | Freq: Every day | ORAL | Status: DC
  Administered 2018-03-08 – 2018-03-09 (×2): 1000 [IU] via ORAL
  Filled 2018-03-08 (×2): qty 1

## 2018-03-08 NOTE — Progress Notes (Signed)
Candace Cruise received for DC to SNF today- bed confirmed at [facility]- awaiting word from MD regarding if pt is cleared for [place], [place] Social Worker [phone]

## 2018-03-08 NOTE — Clinical Social Work Note (Signed)
Clinical Social Work Assessment  Patient Details  Name: Peggy Conner MRN: [id number] Date of Birth: 1925/08/24  Date of referral:  03/08/18               Reason for consult:  Facility Placement                Permission sought to share information with:  Facility Sport and exercise psychologist, Family Supports Permission granted to share information::  No(pt with dementia)  Name::     Journalist, newspaper::  SNF  Relationship::  dtr  Contact Information:     Housing/Transportation Living arrangements for the past 2 months:  [place] of Information:  Adult Children Patient Interpreter Needed:  None Criminal Activity/Legal Involvement Pertinent to Current Situation/Hospitalization:  No - Comment as needed Significant Relationships:  Adult Children Lives with:  Self Do you feel safe going back to the place where you live?  No Need for family participation in patient care:  Yes (Comment)(decision making)  Care giving concerns:  Pt lives at home with help from round the clock caregivers- was only ambulating a few feet a baseline but now with increased weakness follow hip surgery- dtr does not think that returning home would be safe DC plan at this time.   Social Worker assessment / plan:  CSW spoke with pt dtr concerning PT recommendation for SNF.  Explained SNF and SNF referral process.  Pt dtr states pt was at SNF several years ago following hip surgery so is somewhat familiar with the process.  Employment status:  Retired Nurse, adult PT Recommendations:  [place] / Referral to community resources:  [place]  Patient/Family's Response to care:  Pt dtr is agreeable to SNF and hopeful for something closer to home.  Patient/Family's Understanding of and Emotional Response to Diagnosis, Current Treatment, and Prognosis:  Pt dtr seems to have good understanding of pt condition- hopeful that rehab will help the  pt recovery from surgery and allow her to return home after some short term care.  Emotional Assessment Appearance:  Appears stated age Attitude/Demeanor/Rapport:  Unable to Assess Affect (typically observed):  Unable to Assess Orientation:  Oriented to Self, Oriented to Place, Oriented to Situation Alcohol / Substance use:  Not Applicable Psych involvement (Current and /or in the community):  No (Comment)  Discharge Needs  Concerns to be addressed:  Care Coordination Readmission within the last 30 days:  No Current discharge risk:  Physical Impairment Barriers to Discharge:  Continued Medical Work up   [employer], LCSW 03/08/2018, 9:22 AM

## 2018-03-08 NOTE — Progress Notes (Signed)
  Echocardiogram 2D Echocardiogram has been performed.  Peggy Conner 03/08/2018, 9:58 AM

## 2018-03-08 NOTE — Anesthesia Postprocedure Evaluation (Signed)
Anesthesia Post Note  Patient: Peggy Conner  Procedure(s) Performed: ARTHROPLASTY HIP (HEMIARTHROPLASTY) (Left Hip)     Patient location during evaluation: PACU Anesthesia Type: Spinal Level of consciousness: awake and alert Pain management: pain level controlled Vital Signs Assessment: post-procedure vital signs reviewed and stable Respiratory status: spontaneous breathing and respiratory function stable Cardiovascular status: blood pressure returned to baseline and stable Postop Assessment: spinal receding Anesthetic complications: no    Last Vitals:  Vitals:   03/08/18 0300 03/08/18 0514  BP:  (!) 150/71  Pulse:  93  Resp: 16 20  Temp:  37.3 C  SpO2: 98% 96%    Last Pain:  Vitals:   03/08/18 0514  TempSrc: Oral  PainSc:                  Barnet [place]

## 2018-03-08 NOTE — Progress Notes (Addendum)
Subjective: The patient was resting comfortably in her bed today upon entering the room. She was difficult to awaken today, as she typically sleeps until 9:00-10:00 am each day. As per daughter she continues to pleasantly resist PT as she feels weak but denies pain.  Objective:  Vital signs in last 24 hours: Vitals:   03/07/18 2043 03/08/18 0200 03/08/18 0300 03/08/18 0514  BP: 130/66   (!) 150/71  Pulse: 88   93  Resp: 18 18 16 20   Temp: 98.5 F (36.9 C)   99.2 F (37.3 C)  TempSrc: Oral   Oral  SpO2: 93% 95% 98% 96%  Weight:      Height:       ROS negative except as per HPI.  Physical Exam: General: No acute distress, asleep but alert and conversant Neuro: No acute focal deficits observed, oriented to person location and the presence of injury from the fall Cardiovascular: RRR, no murmurs rubs or gallops auscultated Pulmonary: Bilateral lung fields are clear to auscultation GI: Abdomen is soft, nontender, nondistended with normal bowel sounds Musculoskeletal: Surgical site is nontender, nonedematous, absent erythema bilateral extremities nonpitting nontender  Assessment/Plan:  Active Problems:   Essential hypertension   Closed left hip fracture, initial encounter (HCC)   Dementia   Hip fracture (HCC)   Anemia   Hyponatremia   Mobitz type II atrioventricular block  Assessment:  Peggy Conner a 84 year old female with frontal lobe dementia, hypertension, and depression who presented with an acute fall resulting in left hip fracture.  She subsequently underwent repair of the fracture with no acute complications noted following the hemiarthroplasty.  Her pain was well controlled with PRN morphine and acetaminophen.  Given her weakness at baseline in conjunction with her rapid deconditioning while hospitalized it was determined that she would require significant physical therapy and will be need a short-term skilled nursing rehabilitation prior to release home. She was noted to  have symptomatic heart block on 03/06/2018 for which family opted to undergo pacemaker placement on reconsideration after a brief discussion regarding the benefits vs risks.  Hip fracture Closed hip fracture initial encounter: Postop day 4 after her left hip hemiarthroplasty for a displaced femoral neck fracture. -Continue PT/OT -Case social worker initiating SNF placement -Discontinue morphine continues to admit him for pain as needed his pain is well controlled -Continue Lovenox for DVT prophylaxis and 30 days following discharge -She will need to start bisphosphonate therapy ~14 following discharge. --Discharge following orthopedic clearance and cardiology evaluation/discussion for pacemaker placement. She is currently not cleared for discharge.   Mobitz Type II AV block: Several episodes of AV block on the weekend.  Cardiology was consulted but patients initially refusing pacemaker as they were unclear with the procedure and purpose. Upon explaining the process and indications/risks the family has reconsidered and would like to have the pacemaker placed.  Patient noted to have short run of what appears to be ventricular tachycardia with multiple PVCs on telemetry overnight, unclear if this was asymptomatic given her dementia. -Echocardiogram ordered results pending -Continue avoiding AV nodal blocking medications and monitoring electrolytes -Appreciate cardiology's continued assistance, we will re-consult them  Hyponatremia: Patient continues to demonstrate hyponatremia with sodium 132.  His potassium appears to be stable at 4.0. -Repeat BMP daily -Fluid restricted 854ml daily --Strict I/O's -Continue to monitor sodium -Holding home bupropion and losartan  Essential hypertension: Blood pressure poorly controlled today at 150/71. -Will hold her Losartan again today for orthostatic hypotension   Dementia: --Continue delirium precautions  --  Continue home aricept 5mg   daily  Anemia: Post op Hgb 10.6, stable. No signs of blood loss, edema/hematoma surrounding the incision site on exam today. --Repeat CBC daily --Will need repeat CBC in 1 week following discharge --Denying symptoms of anemia --Continue to trend  Diet: Regular Code: DNR Fluids: N/a DVT P PX: Continue Lovenox, additional 30 days following discharge Dispo: Anticipated discharge in approximately 1-2 day(s).   Kathi Ludwig, MD 03/08/2018, 7:34 AM Pager: Pager# [phone]

## 2018-03-08 NOTE — Care Management Important Message (Signed)
Important Message  Patient Details  Name: Peggy Conner MRN: [id number] Date of Birth: March 11, 1925   Medicare Important Message Given:  Yes    Kalsey Lull Montine Circle 03/08/2018, 2:31 PM

## 2018-03-08 NOTE — Progress Notes (Signed)
   I saw and examined the patient. I reviewed the resident's note and I agree with the resident's findings and plan as documented in the resident's note.  Peggy Conner is lying in bed today still with persistent diarrhea this morning.  She has no complaints, denies abdominal pain.  Eating very little.  Continues to appear depressed, weak, and overall fragile.  Hip fracture seems to be healing nicely.  I agree with the team's plan for Lovenox for DVT prophylaxis and bisphosphonate in the near future.  Syncope: Most likely due to cardiac conduction disease and a second-degree type II heart block.  There is a high risk that this is the cause of her initial fall which resulted in this hip fracture.  I want to get her up out of bed more with therapy today to see if this provokes any further symptoms.  After reading more about heart block, the daughter is more amenable to pacemaker placement.  Otherwise we worry that the patient will be high risk for future falls.   Axel Filler, MD 03/08/2018, 11:49 AM

## 2018-03-08 NOTE — Progress Notes (Signed)
Spoke with PT regarding the patient not being seen today. Physical therapy stated that the patient will be seen on tomorrow.  Patient was seen Saturday and Sunday. Typically PT tries to triage the patients that will be discharged first and then see the rest of the patients.  Also, patients discharging to SNF are seen every other day.  Any questions can be directed to PT supervisor, Elta Guadeloupe [phone]. Thanks-Nursing

## 2018-03-08 NOTE — Progress Notes (Addendum)
Electrophysiology Rounding Note  Patient Name: Peggy Conner Date of Encounter: 03/08/2018  Electrophysiologist: Allred (new this admission)   Subjective   The patient is sleeping but arouses. Denies pain or pre-syncope. +diarrhea overnight last night   Inpatient Medications    Scheduled Meds: . acetaminophen  650 mg Oral Q6H  . cholecalciferol  1,000 Units Oral Daily  . enoxaparin (LOVENOX) injection  40 mg Subcutaneous Q24H  . feeding supplement (ENSURE ENLIVE)  237 mL Oral BID BM  . loratadine  10 mg Oral Daily   Continuous Infusions: . sodium chloride     PRN Meds: morphine injection, oxyCODONE, perflutren lipid microspheres (DEFINITY) IV suspension, senna, sodium chloride   Vital Signs    Vitals:   03/07/18 2043 03/08/18 0200 03/08/18 0300 03/08/18 0514  BP: 130/66   (!) 150/71  Pulse: 88   93  Resp: 18 18 16 20   Temp: 98.5 F (36.9 C)   99.2 F (37.3 C)  TempSrc: Oral   Oral  SpO2: 93% 95% 98% 96%  Weight:      Height:        Intake/Output Summary (Last 24 hours) at 03/08/2018 1133 Last data filed at 03/08/2018 0500 Gross per 24 hour  Intake 240 ml  Output 400 ml  Net -160 ml   Filed Weights   03/04/18 1218  Weight: 130 lb (59 kg)    Physical Exam    GEN- The patient is elderly and frail appearing  Head- normocephalic, atraumatic Eyes-  Sclera clear, conjunctiva pink Ears- hearing intact Oropharynx- clear Neck- supple Lungs- Clear to ausculation bilaterally, normal work of breathing Heart- Regular rate and rhythm  GI- soft, NT, ND, + BS Extremities- no clubbing, cyanosis, or edema Skin- no rash or lesion Psych- answers with 1 word answers Neuro- strength and sensation are intact  Labs    CBC Recent Labs    03/06/18 1448 03/07/18 0913 03/08/18 0607  WBC 12.2* 16.9* 16.0*  NEUTROABS 8.9*  --   --   HGB 11.3* 11.5* 10.6*  HCT 34.2* 34.9* 31.9*  MCV 94.2 93.6 94.1  PLT 262 311 144   Basic Metabolic Panel Recent Labs    03/07/18 0913  03/08/18 0607  NA 132* 132*  K 4.3 4.0  CL 101 100*  CO2 24 23  GLUCOSE 143* 100*  BUN 21* 18  CREATININE 0.66 0.55  CALCIUM 7.9* 7.7*     Telemetry    Sinus rhythm, no further AV block in the last 24 hours. Short run WCT (personally reviewed)  Radiology    No results found.   Patient Profile     Peggy Conner is a 84 y.o. female with significant dementia admitted with hip fracture and found to have intermittent high grade AV block.   Assessment & Plan    1.  Intermittent Mobitz II She has degenerative conduction system disease but it is unclear how symptomatic this is.  Long discussion with daughter today about treatment options. We reviewed physiology and the fact that heart block has been intermittent on telemetry while here.  She was under the impression that pacemaker implant would increase alertness. Heart rates have been in the 70's and 80's without recurrent AV block since Saturday with persistent fatigue and drowsiness.  PPM implant will not improve these symptoms.  She relates that her mom is not interested in eating at home or participating in activities.  We reviewed risks of pacemaker implantation. Daughter is leaning towards not proceeding with pacemaker which  I agree with.  She states that when her mom has been lucid she has been clear that she is "tired" and wants to avoid further procedures.  We have discussed monitoring for another 24 hours on telemetry to allow her to make decision Avoid AVN blocking agents   2.  WCT Echo pending I would not pursue additional work up or testing  Dr Lovena Le to see later today   Signed, Chanetta Marshall, NP  03/08/2018, 11:33 AM   EP attending  Patient seen and examined.  Agree with the findings as noted above.  The patient has had transient AV block as well as a wide QRS tachycardia all in the setting of failure to thrive, dementia, and multiple other comorbidities.  The patient's symptoms have not improved despite her conduction  system improving over the past 24 hours.  For this reason I would not recommend insertion of a permanent pacemaker.  It will not change her overall course.  I would recommend avoiding AV nodal blocking drugs or sinus nodal blocking drugs.  No additional treatment per the patient's wide QRS tachycardia at this time.    Cristopher Peru, MD

## 2018-03-09 ENCOUNTER — Encounter (HOSPITAL_COMMUNITY): Payer: Self-pay | Admitting: Student

## 2018-03-09 MED ORDER — ALENDRONATE SODIUM 70 MG PO TABS: 70.0000 mg | ORAL_TABLET | ORAL | 11 refills | Status: AC

## 2018-03-09 MED ORDER — HYDROCODONE-ACETAMINOPHEN 5-325 MG PO TABS: 1.0000 | ORAL_TABLET | Freq: Four times a day (QID) | ORAL | 0 refills | Status: AC | PRN

## 2018-03-09 MED ORDER — ENOXAPARIN SODIUM 40 MG/0.4ML ~~LOC~~ SOLN: 40.0000 mg | SUBCUTANEOUS | 2 refills | Status: DC

## 2018-03-09 NOTE — Progress Notes (Signed)
Patient will discharge to [facility] Anticipated discharge date: 4/9 Family notified: pt dtr [facility] by Corey Harold- scheduled at 2pm Report#: [phone]  [place] signing off.  Jorge Ny, LCSW Clinical Social Worker [phone]

## 2018-03-09 NOTE — Discharge Summary (Signed)
Name: Peggy Conner MRN: [id number] DOB: 14-Feb-1925 84 y.o. PCP: Nickola Major, MD  Date of Admission: 03/04/2018 12:14 PM Date of Discharge: 03/09/2018 Attending Physician: Dr. Lalla Brothers  Discharge Diagnosis: Active Problems:   Essential hypertension   Closed left hip fracture, initial encounter [facility])   Dementia   Hip fracture ([place])   Anemia   Hyponatremia   Mobitz type II atrioventricular block   Discharge Medications: Allergies as of 03/09/2018      Reactions   Pregabalin    UNSPECIFIED REACTION       Medication List    STOP taking these medications   benzonatate 100 MG capsule Commonly known as:  TESSALON   loratadine 10 MG tablet Commonly known as:  CLARITIN     TAKE these medications   alendronate 70 MG tablet Commonly known as:  FOSAMAX Take 1 tablet (70 mg total) by mouth every 7 (seven) days. Take w/ 8oz water on empty stomach. Do not begin taking this medication until 03/24/2018   ARIPiprazole 2 MG tablet Commonly known as:  ABILIFY TAKE 1 TABLET BY MOUTH ONCE DAILY What changed:    how much to take  how to take this  when to take this   ARTHRITIS PAIN RELIEF 650 MG CR tablet Generic drug:  acetaminophen Take 650 mg by mouth every 8 (eight) hours as needed for pain.   buPROPion 300 MG 24 hr tablet Commonly known as:  WELLBUTRIN XL Take 1 tablet (300 mg total) by mouth daily.   docusate sodium 100 MG capsule Commonly known as:  COLACE Take 100 mg by mouth every other day.   donepezil 5 MG tablet Commonly known as:  ARICEPT Take 1 tablet (5 mg total) by mouth at bedtime.   enoxaparin 40 MG/0.4ML injection Commonly known as:  LOVENOX Inject 0.4 mLs (40 mg total) into the skin daily.   ferrous sulfate 325 (65 FE) MG tablet Take 325 mg by mouth every other day.   losartan 100 MG tablet Commonly known as:  COZAAR TAKE 1/2 TABLET ONCE A DAY What changed:    how much to take  how to take this  when to take this  additional  instructions   MULTIVITAMIN ADULT PO Take 1 tablet by mouth daily.       Disposition and follow-up:   [name] was discharged from [facility] in Stable condition.  At the hospital follow up visit please address:  1.  Left Hip fracture-pain control, VTE ppx compliance      Hyponatremia: please assess and consider indications for further treatment vs evaluation      Depression: will need further evaluation for ongoing depression      Heart block: consider long term monitoring, unexplained syncope in the setting of heart block  2.  Labs / imaging needed at time of follow-up: BMP  3.  Pending labs/ test needing follow-up: n/a  Follow-up Appointments:   Hospital Course by problem list: Active Problems:   Essential hypertension   Closed left hip fracture, initial encounter ([place])   Dementia   Hip fracture ([facility])   Anemia   Hyponatremia   Mobitz type II atrioventricular block   1. Closed hip fracture initial encounter: Peggy Conner a 84 year old female with frontal lobe dementia, hypertension, and depression who presented with an acute fall resulting in left hip fracture.  She subsequently underwent repair of the fracture with no acute complications noted following the hemiarthroplasty.  Her pain was well controlled  with PRN morphine and acetaminophen.  Given her weakness at baseline in conjunction with her rapid deconditioning while hospitalized it was determined that she would require significant physical therapy and will be need a short-term skilled nursing rehabilitation prior to release home. She was noted to have symptomatic heart block on 03/06/2018 for which family opted to undergo pacemaker placement on reconsideration after a brief discussion regarding the benefits vs risks.  2. Mobitz Type II AV block: Several episodes of AV block on the weekend with possible associated near syncope.  Cardiology was consulted for consideration of a pacemaker. However, following a  lengthy discussion with multiple providers regarding the risks vs benefits conjunction with the patients stated desires the family and cardiology agreed that this was not the ideal course. Cardiology was not convinced the near syncope was related to the transient heart block and therefor did not warrant intervention in a patient who has stated she did not desire invasive intervention. We have opted instead to avoid AV nodal blocking agents as well as sinus node blocking agents and to move forward with supportive care.  3. Chronic Hyponatremia: Sodium near 132 during this admission which appears to be stable and persistent when compared to prior evaluations. There was not clear cause or improvement with fluids reduction. Please address on the outpatient side and evaluate if further therapy is indicated.  Discharge Vitals:   BP (!) 156/68 (BP Location: Right Arm)   Pulse 76   Temp 98.6 F (37 C) (Oral)   Resp 16   Ht 5\' 4"  (1.626 m)   Wt 130 lb (59 kg)   SpO2 94%   BMI 22.31 kg/m   Pertinent Labs, Studies, and Procedures:  CMP Latest Ref Rng & Units 03/08/2018 03/07/2018 03/06/2018  Glucose 65 - 99 mg/dL 100(H) 143(H) 115(H)  BUN 6 - 20 mg/dL 18 21(H) 16  Creatinine 0.44 - 1.00 mg/dL 0.55 0.66 0.63  Sodium 135 - 145 mmol/L 132(L) 132(L) 131(L)  Potassium 3.5 - 5.1 mmol/L 4.0 4.3 4.0  Chloride 101 - 111 mmol/L 100(L) 101 99(L)  CO2 22 - 32 mmol/L 23 24 24   Calcium 8.9 - 10.3 mg/dL 7.7(L) 7.9(L) 8.1(L)  Total Protein 6.0 - 8.3 g/dL - - -  Total Bilirubin 0.2 - 1.2 mg/dL - - -  Alkaline Phos 39 - 117 U/L - - -  AST 0 - 37 U/L - - -  ALT 0 - 35 U/L - - -   CBC Latest Ref Rng & Units 03/08/2018 03/07/2018 03/06/2018  WBC 4.0 - 10.5 K/uL 16.0(H) 16.9(H) 12.2(H)  Hemoglobin 12.0 - 15.0 g/dL 10.6(L) 11.5(L) 11.3(L)  Hematocrit 36.0 - 46.0 % 31.9(L) 34.9(L) 34.2(L)  Platelets 150 - 400 K/uL 300 311 262   Hip Xray: IMPRESSION: Subcapital femoral neck fracture on the left with varus angulation at  the fracture site. Total hip replacement on the right with prosthetic components well-seated. No dislocation. Bones osteoporotic.  Discharge Instructions: Discharge Instructions    Call MD for:  extreme fatigue   Complete by:  As directed    Call MD for:  persistant dizziness or light-headedness   Complete by:  As directed    Call MD for:  persistant nausea and vomiting   Complete by:  As directed    Diet - low sodium heart healthy   Complete by:  As directed    Increase activity slowly   Complete by:  As directed       Signed: Kathi Ludwig, MD 03/09/2018, 8:17  AM   Pager: Pager# [phone]

## 2018-03-09 NOTE — Addendum Note (Signed)
Addendum  created 03/09/18 2018 by Barnet Glasgow, MD   Sign clinical note

## 2018-03-09 NOTE — Discharge Instructions (Signed)
Orthopaedic Trauma Service Discharge Instructions   General Discharge Instructions  WEIGHT BEARING STATUS: Weight bearing as tolerated  RANGE OF MOTION/ACTIVITY: Posterior hip precaution. No flexion of hip past 45 degrees, no adduction and internal rotation for 6 weeks  Wound Care: Leave wound open to air, may shower  DVT/PE prophylaxis: Lovenox for 30 days  Diet: as you were eating previously.  Can use over the counter stool softeners and bowel preparations, such as Miralax, to help with bowel movements.  Narcotics can be constipating.  Be sure to drink plenty of fluids  PAIN MEDICATION USE AND EXPECTATIONS  You have likely been given narcotic medications to help control your pain.  After a traumatic event that results in an fracture (broken bone) with or without surgery, it is ok to use narcotic pain medications to help control one's pain.  We understand that everyone responds to pain differently and each individual patient will be evaluated on a regular basis for the continued need for narcotic medications. Ideally, narcotic medication use should last no more than 6-8 weeks (coinciding with fracture healing).   As a patient it is your responsibility as well to monitor narcotic medication use and report the amount and frequency you use these medications when you come to your office visit.   We would also advise that if you are using narcotic medications, you should take a dose prior to therapy to maximize you participation.  IF YOU ARE ON NARCOTIC MEDICATIONS IT IS NOT PERMISSIBLE TO OPERATE A MOTOR VEHICLE (MOTORCYCLE/CAR/TRUCK/MOPED) OR HEAVY MACHINERY DO NOT MIX NARCOTICS WITH OTHER CNS (CENTRAL NERVOUS SYSTEM) DEPRESSANTS SUCH AS ALCOHOL   STOP SMOKING OR USING NICOTINE PRODUCTS!!!!  As discussed nicotine severely impairs your body's ability to heal surgical and traumatic wounds but also impairs bone healing.  Wounds and bone heal by forming microscopic blood vessels (angiogenesis) and  nicotine is a vasoconstrictor (essentially, shrinks blood vessels).  Therefore, if vasoconstriction occurs to these microscopic blood vessels they essentially disappear and are unable to deliver necessary nutrients to the healing tissue.  This is one modifiable factor that you can do to dramatically increase your chances of healing your injury.    (This means no smoking, no nicotine gum, patches, etc)  DO NOT USE NONSTEROIDAL ANTI-INFLAMMATORY DRUGS (NSAID'S)  Using products such as Advil (ibuprofen), Aleve (naproxen), Motrin (ibuprofen) for additional pain control during fracture healing can delay and/or prevent the healing response.  If you would like to take over the counter (OTC) medication, Tylenol (acetaminophen) is ok.  However, some narcotic medications that are given for pain control contain acetaminophen as well. Therefore, you should not exceed more than 4000 mg of tylenol in a day if you do not have liver disease.  Also note that there are may OTC medicines, such as cold medicines and allergy medicines that my contain tylenol as well.  If you have any questions about medications and/or interactions please ask your doctor/PA or your pharmacist.      ICE AND ELEVATE INJURED/OPERATIVE EXTREMITY  Using ice and elevating the injured extremity above your heart can help with swelling and pain control.  Icing in a pulsatile fashion, such as 20 minutes on and 20 minutes off, can be followed.    Do not place ice directly on skin. Make sure there is a barrier between to skin and the ice pack.    Using frozen items such as frozen peas works well as the conform nicely to the are that needs to be iced.  USE AN ACE WRAP OR TED HOSE FOR SWELLING CONTROL  In addition to icing and elevation, Ace wraps or TED hose are used to help limit and resolve swelling.  It is recommended to use Ace wraps or TED hose until you are informed to stop.    When using Ace Wraps start the wrapping distally (farthest away from  the body) and wrap proximally (closer to the body)   Example: If you had surgery on your leg or thing and you do not have a splint on, start the ace wrap at the toes and work your way up to the thigh        If you had surgery on your upper extremity and do not have a splint on, start the ace wrap at your fingers and work your way up to the upper arm  [place]: [phone]

## 2018-03-09 NOTE — Clinical Social Work Placement (Signed)
   CLINICAL SOCIAL WORK PLACEMENT  NOTE  Date:  03/09/2018  Patient Details  Name: Peggy Conner MRN: [id number] Date of Birth: 1925/09/14  Clinical Social Work is seeking post-discharge placement for this patient at the [place] level of care (*CSW will initial, date and re-position this form in  chart as items are completed):  Yes   Patient/family provided with [place] Work Department's list of facilities offering this level of care within the geographic area requested by the patient (or if unable, by the patient's family).  Yes   Patient/family informed of their freedom to choose among providers that offer the needed level of care, that participate in Medicare, Medicaid or managed care program needed by the patient, have an available bed and are willing to accept the patient.  Yes   Patient/family informed of 's ownership interest in [facility] and [facility], as well as of the fact that they are under no obligation to receive care at these facilities.  PASRR submitted to EDS on       PASRR number received on       Existing PASRR number confirmed on 03/07/18     FL2 transmitted to all facilities in geographic area requested by pt/family on 03/07/18     FL2 transmitted to all facilities within larger geographic area on       Patient informed that his/her managed care company has contracts with or will negotiate with certain facilities, including the following:        Yes   Patient/family informed of bed offers received.  Patient [facility] bed at [facility]     Physician recommends and patient chooses bed at      Patient to be transferred to [facility] on 03/09/18.  Patient to be transferred to facility by ptar     Patient family notified on 03/09/18 of transfer.  Name of family member notified:  Intisar (dtr)     PHYSICIAN Please sign DNR, Please sign FL2     Additional Comment:     _______________________________________________ Jorge Ny, LCSW 03/09/2018, 12:42 PM

## 2018-03-09 NOTE — Progress Notes (Signed)
Physical Therapy Treatment Patient Details Name: Peggy Conner MRN: [id number] DOB: [birth year] Today's Date: 03/09/2018    History of Present Illness Pt is a 84 y/o female s/p L hemi arthroplasty (posterior hip precautions). Pt has a past medical history including Anxiety, Arthritis of foot, Carpal tunnel syndrome (09/14/2014), Dementia, Depression, Fracture of ankle, Gait disorder (09/06/2014), GERD, HOH, HTN, Macular degeneration, Ovarian cancer, and Peripheral neuropathy    PT Comments    Patient limited by pain and fatigue this session. Pt tolerated taking sidesteps toward [facility] with mod A and RW. Max A +2 for bed mobility. Continue to progress as tolerated with anticipated d/c to SNF for further skilled PT services.     Follow Up Recommendations  SNF     Equipment Recommendations       Recommendations for Other Services       Precautions / Restrictions Precautions Precautions: Posterior Hip Precaution Booklet Issued: Yes (comment) Restrictions Weight Bearing Restrictions: Yes LLE Weight Bearing: Weight bearing as tolerated    Mobility  Bed Mobility Overal bed mobility: Needs Assistance Bed Mobility: Supine to Sit;Sit to Supine     Supine to sit: Max assist;+2 for physical assistance Sit to supine: Max assist;+2 for physical assistance   General bed mobility comments: cues for sequencing; assist to bring bilat LE to EOB, scoot hips with bed pad, and elevate trunk into sitting; pt assisted with use of rail to come into sitting  Transfers Overall transfer level: Needs assistance Equipment used: Rolling walker (2 wheeled) Transfers: Sit to/from Stand Sit to Stand: +2 physical assistance;From elevated surface;Min assist         General transfer comment: sit to stands X2 from EOB; assist to power up into sitting and to maintain balance in standing; multimodal cues for posture; pt tends to maintain flexed posture in standing  Ambulation/Gait Ambulation/Gait assistance: Mod  assist   Assistive device: Rolling walker (2 wheeled) Gait Pattern/deviations: Step-to pattern;Trunk flexed     General Gait Details: cues for posture and sequencing; assistance for balance; pt able to take side steps up to [facility]; attempted to take steps forward after seated break however pt unable and returned to sitting   Stairs            Wheelchair Mobility    Modified Rankin (Stroke Patients Only)       Balance Overall balance assessment: Needs assistance;History of Falls Sitting-balance support: Bilateral upper extremity supported;Feet supported Sitting balance-Leahy Scale: Poor   Postural control: Posterior lean;Right lateral lean Standing balance support: Bilateral upper extremity supported Standing balance-Leahy Scale: Poor                              Cognition Arousal/Alertness: Awake/alert Behavior During Therapy: WFL for tasks assessed/performed Overall Cognitive Status: History of cognitive impairments - at baseline                                        Exercises      General Comments        Pertinent Vitals/Pain Pain Assessment: Faces Faces Pain Scale: Hurts even more Pain Location: L hip Pain Descriptors / Indicators: Discomfort;Sore Pain Intervention(s): Limited activity within patient's tolerance;Monitored during session;Repositioned    Home Living                      Prior Function  PT Goals (current goals can now be found in the care plan section) Progress towards PT goals: Progressing toward goals    Frequency    Min 5X/week      PT Plan Current plan remains appropriate    Co-evaluation              AM-PAC PT "6 Clicks" Daily Activity  Outcome Measure  Difficulty turning over in bed (including adjusting bedclothes, sheets and blankets)?: Unable Difficulty moving from lying on back to sitting on the side of the bed? : Unable Difficulty sitting down on and standing  up from a chair with arms (e.g., wheelchair, bedside commode, etc,.)?: Unable Help needed moving to and from a bed to chair (including a wheelchair)?: A Lot Help needed walking in hospital room?: Total Help needed climbing 3-5 steps with a railing? : Total 6 Click Score: 7    End of Session Equipment Utilized During Treatment: Gait belt Activity Tolerance: Patient limited by pain Patient left: in bed;with call bell/phone within reach;with bed alarm set Nurse Communication: Mobility status PT Visit Diagnosis: Unsteadiness on feet (R26.81);Muscle weakness (generalized) (M62.81);History of falling (Z91.81);Difficulty in walking, not elsewhere classified (R26.2);Pain Pain - Right/Left: Left Pain - part of body: Hip     Time: 1340-1406 PT Time Calculation (min) (ACUTE ONLY): 26 min  Charges:  $Therapeutic Activity: 23-37 mins                    G Codes:       Earney Navy, PTA Pager: [phone]     Darliss Cheney 03/09/2018, 2:38 PM

## 2018-03-09 NOTE — Anesthesia Postprocedure Evaluation (Signed)
Anesthesia Post Note  Patient: Peggy Conner  Procedure(s) Performed: ARTHROPLASTY HIP (HEMIARTHROPLASTY) (Left Hip)     Patient location during evaluation: PACU Anesthesia Type: General Level of consciousness: awake and alert Pain management: pain level controlled Vital Signs Assessment: post-procedure vital signs reviewed and stable Respiratory status: spontaneous breathing, nonlabored ventilation, respiratory function stable and patient connected to nasal cannula oxygen Cardiovascular status: blood pressure returned to baseline and stable Postop Assessment: no apparent nausea or vomiting Anesthetic complications: no    Last Vitals:  Vitals:   03/09/18 1000 03/09/18 1400  BP:  138/79  Pulse:  87  Resp:  16  Temp:  36.9 C  SpO2: 97% 95%    Last Pain:  Vitals:   03/09/18 1400  TempSrc: Oral  PainSc: 0-No pain                 Barnet Glasgow

## 2018-03-09 NOTE — Progress Notes (Signed)
Report called to [facility] and given to [place].  PTAR notified.  Saline lock IV d/c left forearm.

## 2018-03-09 NOTE — Progress Notes (Signed)
   Subjective: The patient was again lying in bed. She appeared comfortable but is still refusing PT as she is tired. She is amenable to discharge to a SNF today.  Objective:  Vital signs in last 24 hours: Vitals:   03/08/18 0300 03/08/18 0514 03/08/18 1500 03/08/18 2111  BP:  (!) 150/71 (!) 152/74 139/60  Pulse:  93 84 82  Resp: 16 20    Temp:  99.2 F (37.3 C)  98.6 F (37 C)  TempSrc:  Oral  Oral  SpO2: 98% 96% 95% 93%  Weight:      Height:       ROS negative except as per HPI.  Physical exam: General: in no acute distress, conversant but remains confused at baseline. Neuro: alert and oriented x4, no focal deficits noted Cardio: RRR, no murmur rubs or gallops Pulmonary:  lungs clear to auscultation bilaterally no wheezing GI: Abdomen soft, nontender, nondistended distended Musculoskeletal: Surgical site is clean appearing, nonerythematous, nonedematous, without notable tenderness on palpation.  Assessment/Plan:  Active Problems:   Essential hypertension   Closed left hip fracture, initial encounter ([facility])   Dementia   Hip fracture ([facility])   Anemia   Hyponatremia   Mobitz type II atrioventricular block  Assessment: This is a 84 year old female who presented with a hip fracture following a fall with a PMHx notable for frontal lobe dementia, hypertension, and depression.  She improved symptomatically following hemiarthroplasty.   Plan: Closed hip fracture: PT/OT evaluated the patient today and she will need SNF placement.  Pain control has been unnecessary as the patient to the patient is refusing to mobilize. We have encouraged this as is necessary to progress her to discharge.   Continue pain control, will discharge to SNF with three days of Norco 5mg  TID for pain control if needed while undergoing therapy as she has been resistant to therapy here. Continue PT OT Continue ambulation  Mobitz type II AV block: No notable recurrence of the patient's AV block on  telemetry review.  She is deferring pacemaker placement at this time as she does not feel this is not With her previously stated desires.  Cardiology did not does not feel the heart block is directly associated with the patient's syncopal-like event and agree that given the patient's wishes and current condition that a pacemaker is not indicated at this time.  We will opted instead to avoid AV and/or sinus nodal blocking agents.  Dementia: No acute issues.  Patient continues to  demonstrate dementia at her baseline as per the patient's daughter: Continue delirium precautions Continue home Aricept 5 mg daily  Anemia: CBC this a.m. at 10.6 with no significant decrease from her previous measurement 11.5.  She would benefit from a repeat CBC in 1 week.  Diarrhea: Patient has occasional episodes of "loose watery stool.  C diff negative  Dispo: Anticipated discharge in approximately 0 day(s).   Kathi Ludwig, MD 03/09/2018, 6:32 AM Pager: Pager# [phone]

## 2018-06-21 ENCOUNTER — Encounter: Payer: Self-pay | Admitting: Adult Health

## 2018-06-21 ENCOUNTER — Ambulatory Visit: Payer: Medicare Other | Admitting: Adult Health

## 2018-06-21 VITALS — BP 121/81 | HR 94 | Wt 125.8 lb

## 2018-06-21 DIAGNOSIS — R413 Other amnesia: Secondary | ICD-10-CM | POA: Diagnosis not present

## 2018-06-21 NOTE — Progress Notes (Signed)
I have read the note, and I agree with the clinical assessment and plan.  Laynee Lockamy K Thaxton Pelley   

## 2018-06-21 NOTE — Progress Notes (Signed)
PATIENT: Peggy Conner DOB: [birth year]  REASON FOR VISIT: follow up HISTORY FROM: patient  HISTORY OF PRESENT ILLNESS: Today 06/21/18  Peggy Conner is a 84 year old female with a history of memory disturbance.  She returns today for follow-up.  At the last visit Aricept was decreased to 5 mg at bedtime.  The patient has lost an additional 5 pounds since January. Her daughter was under the impression that Aricept was discontinued however it was on her medication list from [facility].  The patient does require assistance with all ADLs.  Denies any changes in mood or behavior.  Her daughter reports that her appetite is actually better and she is eating better.  No hallucinations.  Patient returns today for evaluation.  HISTORY Peggy Conner is a 84 year old right-handed white female with a history of a progressive memory disorder consistent with Alzheimer's disease.  The patient is on Aricept taking 10 mg at night.  She has had a reduction in appetite, she has lost 12 pounds since last seen and she has lost 27 pounds since she was placed on Aricept over 2 years ago.  The patient has no appetite, she does supplement her diet with Ensure.  She denies any diarrhea or stomach cramps.  The patient still has some troubles with depression, she is on Wellbutrin and Abilify for this.  She returns to the office today for an evaluation.  She comes in today with her daughter.   REVIEW OF SYSTEMS: Out of a complete 14 system review of symptoms, the patient complains only of the following symptoms, and all other reviewed systems are negative.  See HPI  ALLERGIES: Allergies  Allergen Reactions  . Pregabalin     UNSPECIFIED REACTION     HOME MEDICATIONS: Outpatient Medications Prior to Visit  Medication Sig Dispense Refill  . acetaminophen (ARTHRITIS PAIN RELIEF) 650 MG CR tablet Take 650 mg by mouth every 8 (eight) hours as needed for pain.    Marland Kitchen alendronate (FOSAMAX) 70 MG tablet Take 1 tablet (70 mg total) by mouth  every 7 (seven) days. Take w/ 8oz water on empty stomach. Do not begin taking this medication until 03/24/2018 4 tablet 11  . ARIPiprazole (ABILIFY) 2 MG tablet TAKE 1 TABLET BY MOUTH ONCE DAILY (Patient taking differently: TAKE 2 mg  TABLET BY MOUTH ONCE DAILY) 30 tablet 5  . buPROPion (WELLBUTRIN XL) 300 MG 24 hr tablet Take 1 tablet (300 mg total) by mouth daily. 90 tablet 1  . docusate sodium (COLACE) 100 MG capsule Take 100 mg by mouth every other day.     . donepezil (ARICEPT) 5 MG tablet Take 1 tablet (5 mg total) by mouth at bedtime. 90 tablet 3  . enoxaparin (LOVENOX) 40 MG/0.4ML injection Inject 0.4 mLs (40 mg total) into the skin daily. 30 Syringe 2  . ferrous sulfate 325 (65 FE) MG tablet Take 325 mg by mouth every other day.    . losartan (COZAAR) 100 MG tablet TAKE 1/2 TABLET ONCE A DAY (Patient taking differently: Take 50 mg by mouth daily. TAKE 1/2 TABLET ONCE A DAY) 45 tablet 3  . Multiple Vitamins-Minerals (MULTIVITAMIN ADULT PO) Take 1 tablet by mouth daily.     No facility-administered medications prior to visit.     PAST MEDICAL HISTORY: Past Medical History:  Diagnosis Date  . Anxiety   . Arthritis of foot   . Carpal tunnel syndrome 09/14/2014   Bilateral  . Dementia   . Depression   . Fracture of  ankle    no surgery  . Gait disorder 09/06/2014  . GERD (gastroesophageal reflux disease)   . HOH (hard of hearing)    hearing aid, history of tinnitus  . Hypertension   . Macular degeneration, bilateral   . Memory difficulty 09/06/2014  . Ovarian cancer [facility])    s/p surgery and chemothearpy  . Peripheral neuropathy     PAST SURGICAL HISTORY: Past Surgical History:  Procedure Laterality Date  . HIP ARTHROPLASTY Left 03/05/2018   Procedure: ARTHROPLASTY HIP (HEMIARTHROPLASTY);  Surgeon: Shona Needles, MD;  Location: [place];  Service: Orthopedics;  Laterality: Left;  . HIP PINNING  2009  . Ovarian Cancer Debulking   2007    FAMILY HISTORY: Family History    Problem Relation Age of Onset  . Lung cancer Brother   . Heart attack Father   . Cancer Brother   . Lung cancer Brother     SOCIAL HISTORY: Social History   Socioeconomic History  . Marital status: Widowed    Spouse name: Not on file  . Number of children: 2  . Years of education: 73  . Highest education level: Not on file  Occupational History  . Occupation: Retired  Scientific laboratory technician  . Financial resource strain: Not on file  . Food insecurity:    Worry: Not on file    Inability: Not on file  . Transportation needs:    Medical: Not on file    Non-medical: Not on file  Tobacco Use  . Smoking status: Never Smoker  . Smokeless tobacco: Never Used  Substance and Sexual Activity  . Alcohol use: No    Comment: occasional  . Drug use: No  . Sexual activity: Not on file  Lifestyle  . Physical activity:    Days per week: Not on file    Minutes per session: Not on file  . Stress: Not on file  Relationships  . Social connections:    Talks on phone: Not on file    Gets together: Not on file    Attends religious service: Not on file    Active member of club or organization: Not on file    Attends meetings of clubs or organizations: Not on file    Relationship status: Not on file  . Intimate partner violence:    Fear of current or ex partner: Not on file    Emotionally abused: Not on file    Physically abused: Not on file    Forced sexual activity: Not on file  Other Topics Concern  . Not on file  Social History Narrative   05/28/17 Patient lives alone but has caregivers daily.    Patient has 2 children   Patient is retired.    Fun: Play golf, watch golf;    Patient drinks 3 cups of caffeine daily.   Patient is right handed.      PHYSICAL EXAM  Vitals:   06/21/18 1458  BP: 121/81  Pulse: 94  SpO2: 96%  Weight: 125 lb 12.8 oz (57.1 kg)   Body mass index is 21.59 kg/m.   MMSE - Mini Mental State Exam 06/21/2018 12/16/2017 05/28/2017  Orientation to time 2 2 2    Orientation to Place 3 4 3   Registration 3 3 3   Attention/ Calculation 2 2 3   Recall 0 1 3  Language- name 2 objects 2 2 2   Language- repeat 1 1 0  Language- follow 3 step command 2 2 3   Language- read & follow  direction 1 1 1   Write a sentence 1 1 1   Copy design 0 0 0  Total score 17 19 21      Generalized: Well developed, in no acute distress   Neurological examination  Mentation: Alert. Follows all commands speech and language fluent Cranial nerve II-XII: Pupils were equal round reactive to light. Extraocular movements were full, visual field were full on confrontational test. Facial sensation and strength were normal. Uvula tongue midline. Head turning and shoulder shrug  were normal and symmetric. Motor: The motor testing reveals 5 over 5 strength of all 4 extremities. Good symmetric motor tone is noted throughout.  Sensory: Sensory testing is intact to soft touch on all 4 extremities. No evidence of extinction is noted.  Coordination: Cerebellar testing reveals good finger-nose-finger and heel-to-shin bilaterally.  Gait and station: Patient uses a walker when ambulating. Reflexes: Deep tendon reflexes are symmetric and normal bilaterally.   DIAGNOSTIC DATA (LABS, IMAGING, TESTING) - I reviewed patient records, labs, notes, testing and imaging myself where available.  Lab Results  Component Value Date   WBC 16.0 (H) 03/08/2018   HGB 10.6 (L) 03/08/2018   HCT 31.9 (L) 03/08/2018   MCV 94.1 03/08/2018   PLT 300 03/08/2018      Component Value Date/Time   NA 132 (L) 03/08/2018 0607   K 4.0 03/08/2018 0607   CL 100 (L) 03/08/2018 0607   CO2 23 03/08/2018 0607   GLUCOSE 100 (H) 03/08/2018 0607   BUN 18 03/08/2018 0607   CREATININE 0.55 03/08/2018 0607   CALCIUM 7.7 (L) 03/08/2018 0607   PROT 6.8 08/28/2016 1704   ALBUMIN 3.6 08/28/2016 1704   AST 17 08/28/2016 1704   ALT 12 08/28/2016 1704   ALKPHOS 62 08/28/2016 1704   BILITOT 0.3 08/28/2016 1704   GFRNONAA >60  03/08/2018 0607   GFRAA >60 03/08/2018 0607    Lab Results  Component Value Date   KGMWNUUV25 366 09/06/2014   Lab Results  Component Value Date   TSH 2.19 12/28/2014      ASSESSMENT AND PLAN 84 y.o. year old female  has a past medical history of Anxiety, Arthritis of foot, Carpal tunnel syndrome (09/14/2014), Dementia, Depression, Fracture of ankle, Gait disorder (09/06/2014), GERD (gastroesophageal reflux disease), HOH (hard of hearing), Hypertension, Macular degeneration, bilateral, Memory difficulty (09/06/2014), Ovarian cancer ([place]), and Peripheral neuropathy. here with:  1.  Memory disturbance  The patient's memory score has remained relatively stable.  We will discontinue Aricept due to continued weight loss.  We discussed starting Namenda however the daughter deferred.  She does not want to start any new medication at this time.  Patient is advised that if her symptoms worsen or she develops new symptoms she should let us know.  She will follow-up in 6 months or sooner if needed.   I spent 15 minutes with the patient. 50% of this time was spent discussing memory score   Ward Givens, MSN, NP-C 06/21/2018, 1:46 PM [facility] [address] [place], [place] [address] [phone]

## 2018-06-21 NOTE — Patient Instructions (Signed)
Your Plan:  Continue to monitor memory Stop Aricept If your symptoms worsen or you develop new symptoms please let us know.    Thank you for coming to see [place] at [facility]. I hope we have been able to provide you high quality care today.  You may receive a patient satisfaction survey over the next few weeks. We would appreciate your feedback and comments so that we may continue to improve ourselves and the health of our patients.

## 2018-11-29 ENCOUNTER — Emergency Department (HOSPITAL_BASED_OUTPATIENT_CLINIC_OR_DEPARTMENT_OTHER): Payer: Medicare Other

## 2018-11-29 ENCOUNTER — Emergency Department (HOSPITAL_BASED_OUTPATIENT_CLINIC_OR_DEPARTMENT_OTHER)
Admission: EM | Admit: 2018-11-29 | Discharge: 2018-11-30 | Disposition: A | Discharge status: Home / Self Care | Payer: Medicare Other | Attending: Emergency Medicine | Admitting: Emergency Medicine

## 2018-11-29 ENCOUNTER — Other Ambulatory Visit: Payer: Self-pay

## 2018-11-29 ENCOUNTER — Encounter (HOSPITAL_BASED_OUTPATIENT_CLINIC_OR_DEPARTMENT_OTHER): Payer: Self-pay | Admitting: *Deleted

## 2018-11-29 DIAGNOSIS — Y92122 Bedroom in nursing home as the place of occurrence of the external cause: Secondary | ICD-10-CM | POA: Diagnosis not present

## 2018-11-29 DIAGNOSIS — F0390 Unspecified dementia without behavioral disturbance: Secondary | ICD-10-CM | POA: Insufficient documentation

## 2018-11-29 DIAGNOSIS — Y9389 Activity, other specified: Secondary | ICD-10-CM | POA: Diagnosis not present

## 2018-11-29 DIAGNOSIS — F419 Anxiety disorder, unspecified: Secondary | ICD-10-CM | POA: Diagnosis not present

## 2018-11-29 DIAGNOSIS — Y998 Other external cause status: Secondary | ICD-10-CM | POA: Insufficient documentation

## 2018-11-29 DIAGNOSIS — W06XXXA Fall from bed, initial encounter: Secondary | ICD-10-CM | POA: Insufficient documentation

## 2018-11-29 DIAGNOSIS — Z8543 Personal history of malignant neoplasm of ovary: Secondary | ICD-10-CM | POA: Insufficient documentation

## 2018-11-29 DIAGNOSIS — S199XXA Unspecified injury of neck, initial encounter: Secondary | ICD-10-CM | POA: Diagnosis not present

## 2018-11-29 DIAGNOSIS — Z79899 Other long term (current) drug therapy: Secondary | ICD-10-CM | POA: Diagnosis not present

## 2018-11-29 DIAGNOSIS — Z96642 Presence of left artificial hip joint: Secondary | ICD-10-CM | POA: Insufficient documentation

## 2018-11-29 DIAGNOSIS — S0990XA Unspecified injury of head, initial encounter: Secondary | ICD-10-CM | POA: Diagnosis present

## 2018-11-29 DIAGNOSIS — F329 Major depressive disorder, single episode, unspecified: Secondary | ICD-10-CM | POA: Insufficient documentation

## 2018-11-29 DIAGNOSIS — I10 Essential (primary) hypertension: Secondary | ICD-10-CM | POA: Insufficient documentation

## 2018-11-29 DIAGNOSIS — W19XXXA Unspecified fall, initial encounter: Secondary | ICD-10-CM

## 2018-11-29 NOTE — ED Notes (Signed)
Pt given chicken parm and sprite. Grandson at bedside. Updated with plan for [facility] to transport back home

## 2018-11-29 NOTE — ED Provider Notes (Signed)
[facility] EMERGENCY DEPARTMENT Provider Note   CSN: [id number] Arrival date & time: 11/29/18  1801     History   Chief Complaint Chief Complaint  Patient presents with  . Fall    HPI Peggy Conner is a 84 y.o. female.  84 year old female with past medical history including dementia, GERD, ovarian cancer, macular degeneration who presents with fall.  Today she was at her assisted living facility and was found on the floor.  Facility suspected that she may have rolled out of bed but it was unwitnessed.  Currently she denies any complaints of pain.  The history is provided by the nursing home.    Past Medical History:  Diagnosis Date  . Anxiety   . Arthritis of foot   . Carpal tunnel syndrome 09/14/2014   Bilateral  . Dementia ([place])   . Depression   . Fracture of ankle    no surgery  . Gait disorder 09/06/2014  . GERD (gastroesophageal reflux disease)   . HOH (hard of hearing)    hearing aid, history of tinnitus  . Hypertension   . Macular degeneration, bilateral   . Memory difficulty 09/06/2014  . Ovarian cancer [facility])    s/p surgery and chemothearpy  . Peripheral neuropathy     Patient Active Problem List   Diagnosis Date Noted  . Anemia 03/07/2018  . Hyponatremia 03/07/2018  . Mobitz type II atrioventricular block 03/07/2018  . Closed left hip fracture, initial encounter ([place]) 03/04/2018  . Dementia ([place]) 03/04/2018  . Hip fracture ([place]) 03/04/2018  . Ankle fracture, right, closed, initial encounter 01/14/2017  . Foot injury, right, initial encounter 01/03/2017  . Acute upper respiratory infection 11/04/2016  . Depressed mood 08/16/2015  . Cough 12/28/2014  . Fatigue 12/28/2014  . Essential hypertension 12/28/2014  . Carpal tunnel syndrome 09/14/2014  . Memory difficulty 09/06/2014  . Gait disorder 09/06/2014  . 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 Ovarian cancer ([place]) 12/15/2011    Past Surgical History:  Procedure Laterality Date  . HIP  ARTHROPLASTY Left 03/05/2018   Procedure: ARTHROPLASTY HIP (HEMIARTHROPLASTY);  Surgeon: Shona Needles, MD;  Location: [place];  Service: Orthopedics;  Laterality: Left;  . HIP PINNING  2009  . Ovarian Cancer Debulking   2007     OB History   No obstetric history on file.      Home Medications    Prior to Admission medications   Medication Sig Start Date End Date Taking? Authorizing Provider  acetaminophen (ARTHRITIS PAIN RELIEF) 650 MG CR tablet Take 650 mg by mouth every 8 (eight) hours as needed for pain.   Yes [provider]  alendronate (FOSAMAX) 70 MG tablet Take 1 tablet (70 mg total) by mouth every 7 (seven) days. Take w/ 8oz water on empty stomach. Do not begin taking this medication until 03/24/2018 03/09/18 03/09/19 Yes Kathi Ludwig, MD  buPROPion (WELLBUTRIN XL) 300 MG 24 hr tablet Take 1 tablet (300 mg total) by mouth daily. 06/29/17  Yes Ward Givens, NP  ferrous sulfate 325 (65 FE) MG tablet Take 325 mg by mouth every other day.   Yes [provider]  losartan (COZAAR) 100 MG tablet TAKE 1/2 TABLET ONCE A DAY Patient taking differently: Take 50 mg by mouth daily. TAKE 1/2 TABLET ONCE A DAY 08/28/16  Yes Golden Circle, FNP  Multiple Vitamins-Minerals (MULTIVITAMIN ADULT PO) Take 1 tablet by mouth daily.   Yes [provider]  docusate sodium (COLACE) 100 MG capsule Take  100 mg by mouth every other day.     [provider]    Family History Family History  Problem Relation Age of Onset  . Lung cancer Brother   . Heart attack Father   . Cancer Brother   . Lung cancer Brother     Social History Social History   Tobacco Use  . Smoking status: Never Smoker  . Smokeless tobacco: Never Used  Substance Use Topics  . Alcohol use: No    Comment: occasional  . Drug use: No     Allergies   Pregabalin   Review of Systems Review of Systems  Unable to perform ROS: Dementia     Physical Exam Updated Vital Signs BP  (!) 141/100 (BP Location: Right Arm)   Pulse 80   Temp 98.1 F (36.7 C) (Oral)   Resp 16   Ht 5\' 2"  (1.575 m)   Wt 60.3 kg   SpO2 97%   BMI 24.33 kg/m   Physical Exam Vitals signs and nursing note reviewed.  Constitutional:      General: She is not in acute distress.    Appearance: She is well-developed.  HENT:     Head: Normocephalic and atraumatic.     Comments: Non-tender mass, likely lipoma, L forehead    Mouth/Throat:     Mouth: Mucous membranes are moist.     Pharynx: Oropharynx is clear.  Eyes:     Conjunctiva/sclera: Conjunctivae normal.     Pupils: Pupils are equal, round, and reactive to light.  Neck:     Musculoskeletal: Normal range of motion and neck supple.  Cardiovascular:     Rate and Rhythm: Normal rate and regular rhythm.     Heart sounds: Normal heart sounds. No murmur.  Pulmonary:     Effort: Pulmonary effort is normal.     Breath sounds: Normal breath sounds.  Chest:     Chest wall: No tenderness.  Abdominal:     General: Bowel sounds are normal. There is no distension.     Palpations: Abdomen is soft.     Tenderness: There is no abdominal tenderness.  Musculoskeletal: Normal range of motion.        General: No swelling, tenderness, deformity or signs of injury.  Skin:    General: Skin is warm and dry.  Neurological:     Mental Status: She is alert.     Comments: Fluent speech, oriented to person, following commands  Psychiatric:        Mood and Affect: Mood normal.      ED Treatments / Results  Labs (all labs ordered are listed, but only abnormal results are displayed) Labs Reviewed - No data to display  EKG None  Radiology Dg Chest 2 View  Result Date: 11/29/2018 CLINICAL DATA:  Unwitnessed fall EXAM: CHEST - 2 VIEW COMPARISON:  March 04, 2018 FINDINGS: There is atelectatic change in both lateral lung base regions. The lungs elsewhere are clear. Heart is upper normal in size with pulmonary vascularity normal. There is a focal  hiatal hernia. No adenopathy. There is degenerative change in both shoulders. Bones are osteoporotic. No pneumothorax. There is slight anterior wedging of a midthoracic vertebral body, age uncertain. IMPRESSION: Bibasilar atelectasis. No frank edema or consolidation. Heart upper normal in size. Small hiatal hernia. Aortic atherosclerosis. Bones osteoporotic. Slight anterior wedging of a midthoracic vertebral body, age uncertain. Aortic Atherosclerosis (ICD10-I70.0). Electronically Signed   By: Lowella Grip III M.D.   On: 11/29/2018 19:58  Ct Head Wo Contrast  Result Date: 11/29/2018 CLINICAL DATA:  84 year old female with head and neck injury following fall. Initial encounter. EXAM: CT HEAD WITHOUT CONTRAST CT CERVICAL SPINE WITHOUT CONTRAST TECHNIQUE: Multidetector CT imaging of the head and cervical spine was performed following the standard protocol without intravenous contrast. Multiplanar CT image reconstructions of the cervical spine were also generated. COMPARISON:  09/20/2014 MR, 05/24/2012 CT and prior studies FINDINGS: CT HEAD FINDINGS Brain: No evidence of acute infarction, hemorrhage, hydrocephalus, extra-axial collection or mass lesion/mass effect. Atrophy and chronic small-vessel white matter ischemic changes are again noted. Vascular: Atherosclerotic calcifications again noted. Skull: No acute abnormality. Sinuses/Orbits: No acute abnormality. A remote LEFT orbital floor fracture is again identified Other: None CT CERVICAL SPINE FINDINGS Alignment: 3 mm retrolisthesis of C4 on C5 appears unchanged from prior CT scout images. No acute subluxation identified. Skull base and vertebrae: No acute fracture. No primary bone lesion or focal pathologic process. Soft tissues and spinal canal: No prevertebral fluid or swelling. No visible canal hematoma. Disc levels: Multilevel degenerative disc disease, spondylosis and facet arthropathy again noted contributing to central spinal and foraminal  narrowing. There is moderate central spinal narrowing at C4-5. Upper chest: No acute abnormality. Other: None IMPRESSION: 1. No evidence of acute intracranial abnormality. Atrophy and chronic small-vessel white matter ischemic changes. 2. No static evidence of acute injury to the cervical spine. Degenerative changes as described. Electronically Signed   By: Margarette Canada M.D.   On: 11/29/2018 20:25   Ct Cervical Spine Wo Contrast  Result Date: 11/29/2018 CLINICAL DATA:  84 year old female with head and neck injury following fall. Initial encounter. EXAM: CT HEAD WITHOUT CONTRAST CT CERVICAL SPINE WITHOUT CONTRAST TECHNIQUE: Multidetector CT imaging of the head and cervical spine was performed following the standard protocol without intravenous contrast. Multiplanar CT image reconstructions of the cervical spine were also generated. COMPARISON:  09/20/2014 MR, 05/24/2012 CT and prior studies FINDINGS: CT HEAD FINDINGS Brain: No evidence of acute infarction, hemorrhage, hydrocephalus, extra-axial collection or mass lesion/mass effect. Atrophy and chronic small-vessel white matter ischemic changes are again noted. Vascular: Atherosclerotic calcifications again noted. Skull: No acute abnormality. Sinuses/Orbits: No acute abnormality. A remote LEFT orbital floor fracture is again identified Other: None CT CERVICAL SPINE FINDINGS Alignment: 3 mm retrolisthesis of C4 on C5 appears unchanged from prior CT scout images. No acute subluxation identified. Skull base and vertebrae: No acute fracture. No primary bone lesion or focal pathologic process. Soft tissues and spinal canal: No prevertebral fluid or swelling. No visible canal hematoma. Disc levels: Multilevel degenerative disc disease, spondylosis and facet arthropathy again noted contributing to central spinal and foraminal narrowing. There is moderate central spinal narrowing at C4-5. Upper chest: No acute abnormality. Other: None IMPRESSION: 1. No evidence of acute  intracranial abnormality. Atrophy and chronic small-vessel white matter ischemic changes. 2. No static evidence of acute injury to the cervical spine. Degenerative changes as described. Electronically Signed   By: Margarette Canada M.D.   On: 11/29/2018 20:25    Procedures Procedures (including critical care time)  Medications Ordered in ED Medications - No data to display   Initial Impression / Assessment and Plan / ED Course  I have reviewed the triage vital signs and the nursing notes.  Pertinent imaging results that were available during my care of the patient were reviewed by me and considered in my medical decision making (see chart for details).    Comfortable w/ no complaints or tenderness on exam.  The head,  cervical spine, and chest x-ray did for acute injury.  The patient remains comfortable on reassessment and is tolerating p.o. without problems.  Return precautions discussed with family member.  Patient discharged back to nursing facility.  Final Clinical Impressions(s) / ED Diagnoses   Final diagnoses:  Fall, initial encounter    ED Discharge Orders    None       Little, Wenda Overland, MD 11/29/18 2100

## 2018-11-29 NOTE — Discharge Instructions (Addendum)
RETURN TO ER IF ANY LETHARGY, ABNORMAL BEHAVIOR, VOMITING, CONFUSION, OR OTHER CONCERNS FOR INJURY.

## 2018-11-29 NOTE — ED Notes (Signed)
Report given to [facility] at [place]

## 2018-11-29 NOTE — ED Triage Notes (Signed)
Pt was found on the floor of her assisted living facility. Not sure how she got there, she stated that she rolled out of bed.  Per EMS pt has a hematoma on Left side of forehead above eye.

## 2018-12-09 ENCOUNTER — Telehealth: Payer: Self-pay | Admitting: Adult Health

## 2018-12-09 NOTE — Telephone Encounter (Signed)
Okay to cancel revisit evaluation.  The patient currently is not getting any medications through this office.

## 2018-12-09 NOTE — Telephone Encounter (Signed)
Pt daughter(on DPR-[place])is asking for a call to discuss the decline in health of pt.  She states since pt was last seen here she has fallen, broke her hip and there has been a decline in memory.  Please call daughter to discuss concerns

## 2018-12-09 NOTE — Telephone Encounter (Signed)
Spoke with daughter and advised her of Dr Jannifer Franklin' reply. She agreed to cancel follow up. Appointment cancelled. I told daughter to call for any concerns or needs. She verbalized understanding, appreciation.

## 2018-12-09 NOTE — Telephone Encounter (Addendum)
Called daughter, Bricelyn and reminded her of patient's follow up with Dr Jannifer Franklin 12/04/2018.  Daughter stated currently she's living at [facility] after hip fracture. Daughter noticed a decline in memory, frequency of speech, and ability to do ADL's. Her mother is being eval by PT, has swallow test next week. She has  fallen x 2 out of bed, cannot walk and is very weak. Daughter stated it is very difficult to transport her and hard on the patient. She is asking if it is necessary for Dr Jannifer Franklin to see her on 12/11/2018. I advised will let Dr Jannifer Franklin know and call her back about the need for FU at this time. Faelynn verbalized understanding, appreciation.

## 2018-12-22 ENCOUNTER — Ambulatory Visit: Payer: Medicare Other | Admitting: Neurology

## 2019-01-01 DEATH — deceased

## 2019-09-10 IMAGING — CT CT CERVICAL SPINE W/O CM
3 of 8 series · 10 of 33 positions shown, 11 images · non-contrast
Comparison: 09/20/2014 MR, 05/24/2012 CT and prior studies

CLINICAL DATA: [AGE] female with head and neck injury
following fall. Initial encounter.

EXAM:
CT HEAD WITHOUT CONTRAST
CT CERVICAL SPINE WITHOUT CONTRAST
TECHNIQUE: Multidetector CT imaging of the head and cervical spine was
performed following the standard protocol without intravenous
contrast. Multiplanar CT image reconstructions of the cervical spine
were also generated.

[Series 11: coronals · coronal · 0.33mm/px · 3 of 61 slices shown]
[im 16/61  bone]
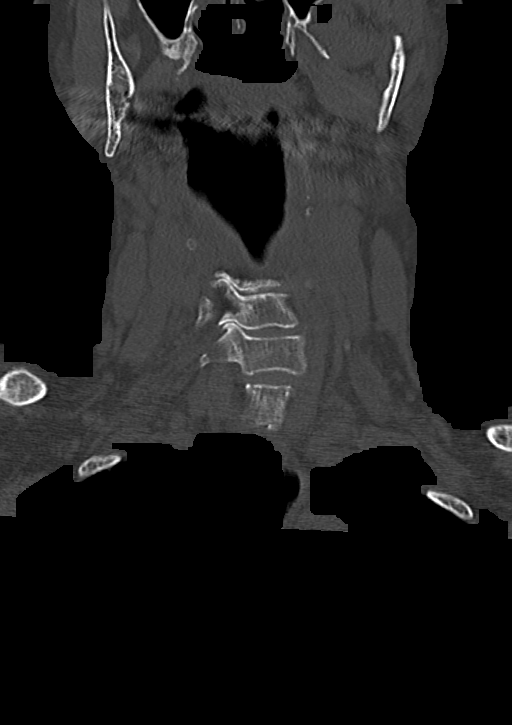
[im 31/61  bone]
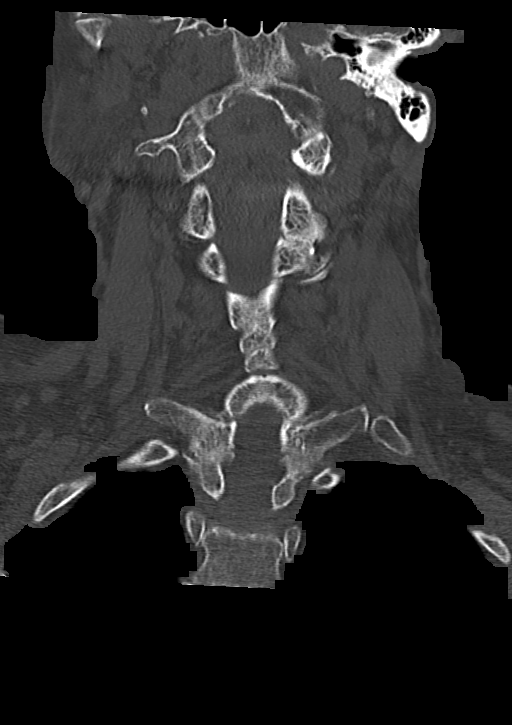
[im 46/61  bone]
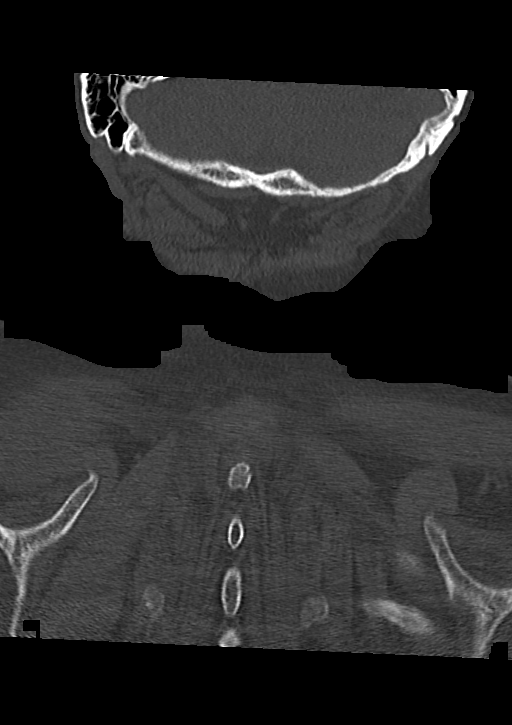

[Series 12: sagittals · sagittal · 0.23mm/px · 5 of 86 slices shown]
[im 13/86  bone]
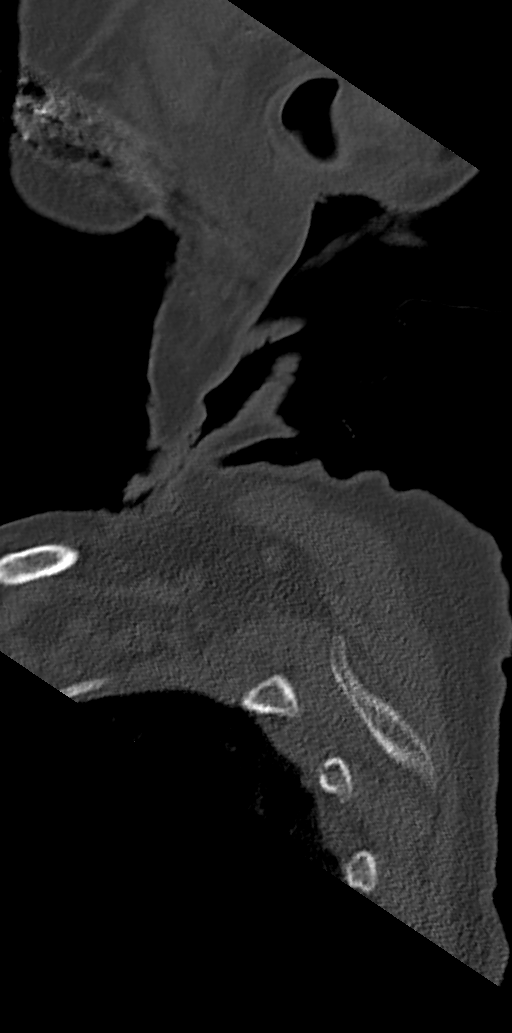
[im 25/86  bone]
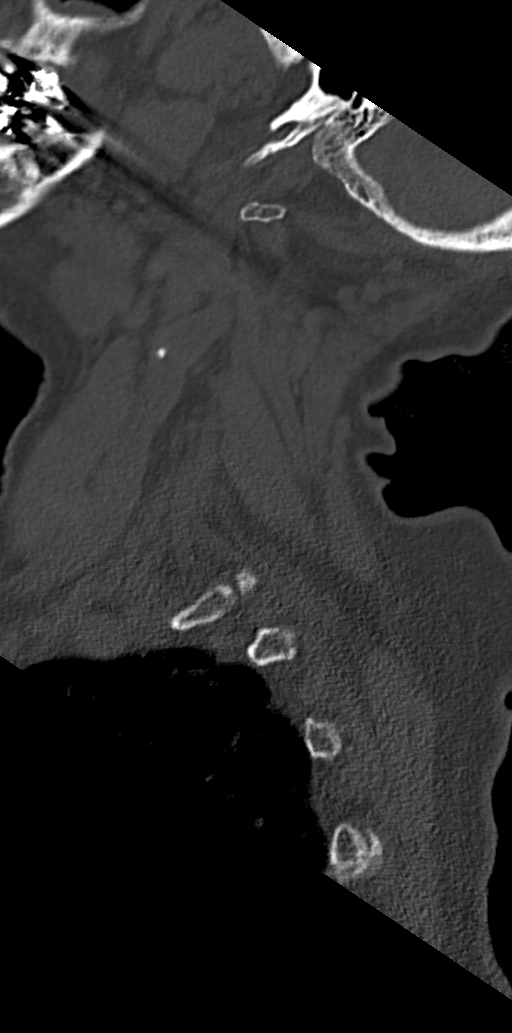
[im 37/86  bone]
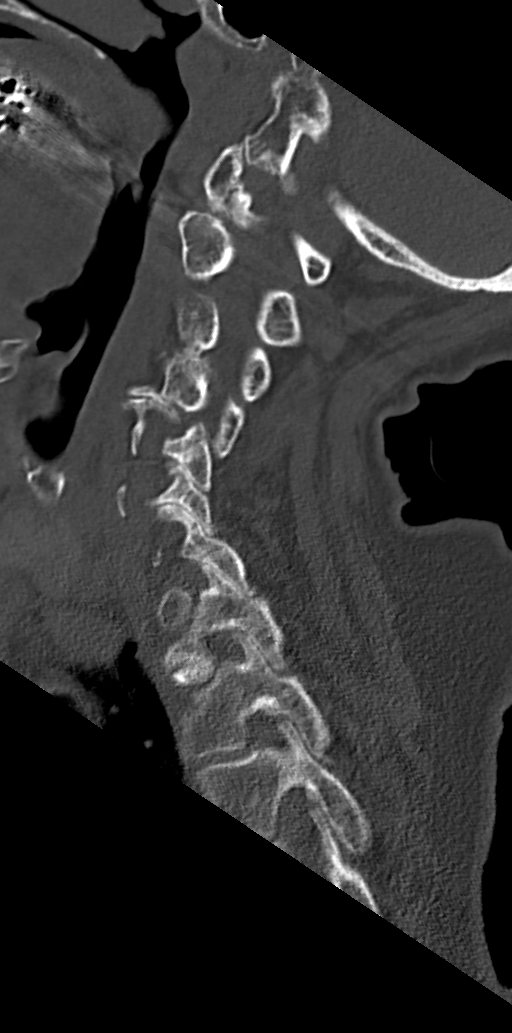
[im 49/86  bone]
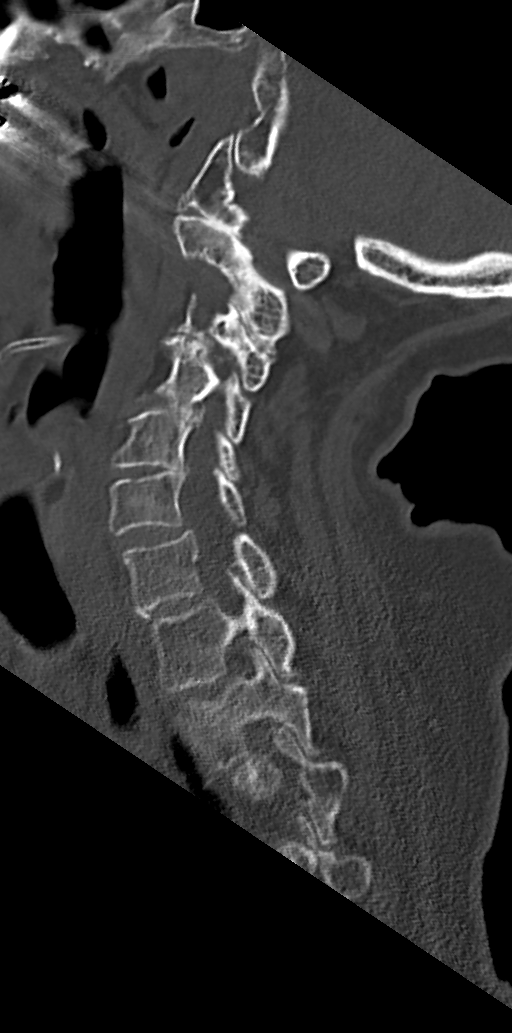
[im 61/86  bone]
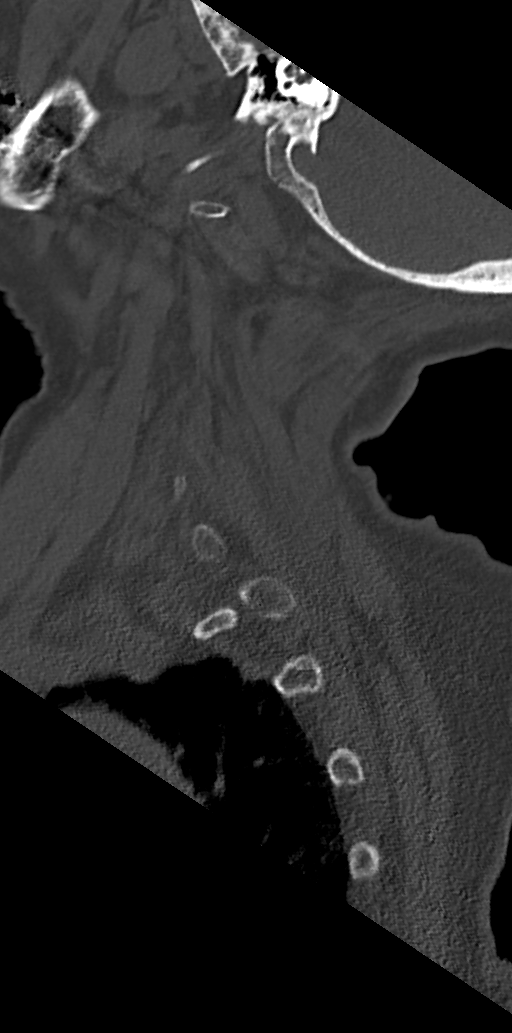

[Series 13: orthogonals · axial · 0.23mm/px · z∈[+117,+183]mm · 2 of 121 slices shown, 3 images]
[im 41/121  soft-tissue]
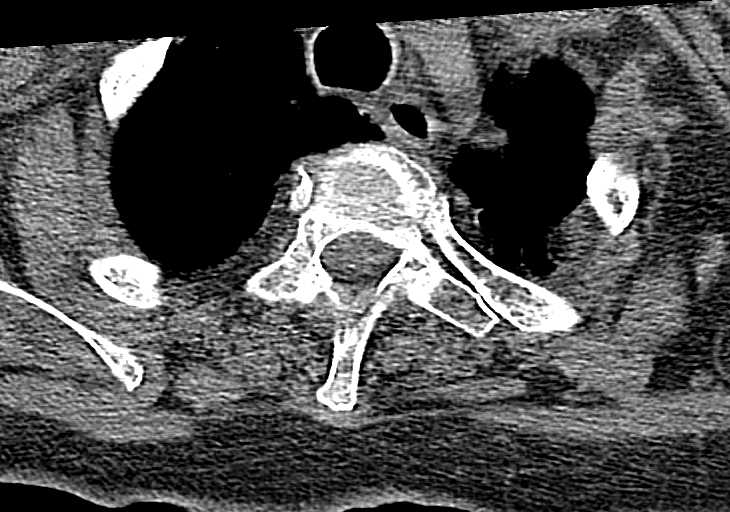
[im 41/121  bone]
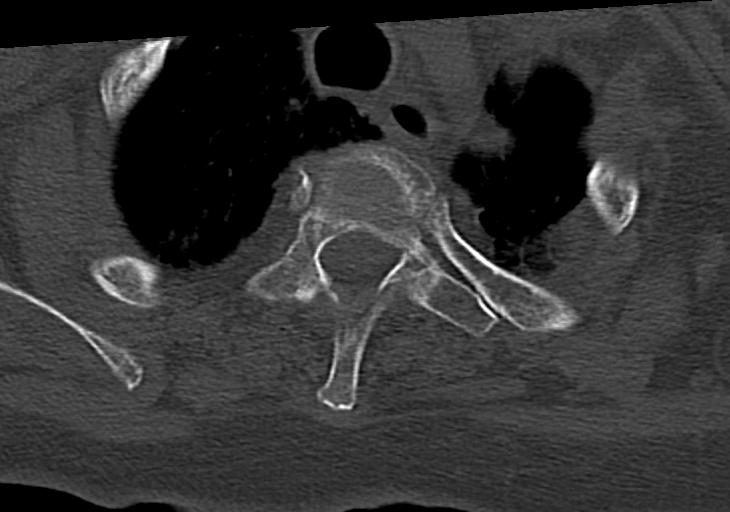
[im 81/121  bone]
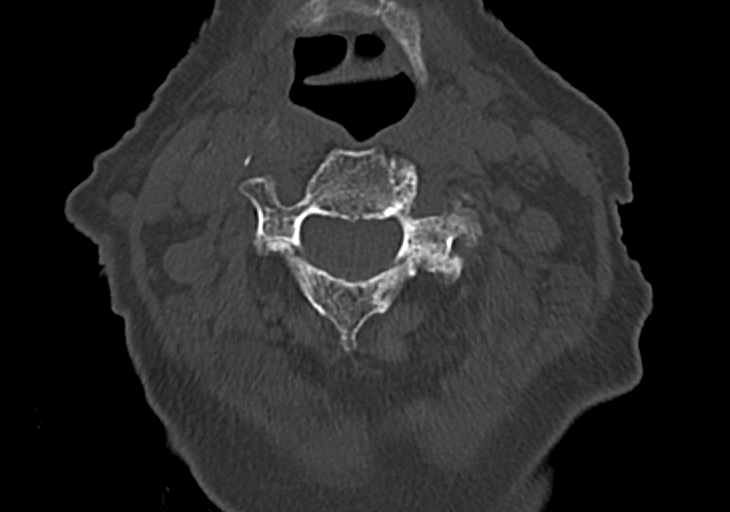

[10 of 33 positions shown; findings below may reference images not displayed]

FINDINGS: CT HEAD FINDINGS

Brain: No evidence of acute infarction, hemorrhage, hydrocephalus,
extra-axial collection or mass lesion/mass effect.

Atrophy and chronic small-vessel white matter ischemic changes are
again noted.

Vascular: Atherosclerotic calcifications again noted.

Skull: No acute abnormality.

Sinuses/Orbits: No acute abnormality. A remote LEFT orbital floor
fracture is again identified

Other: None

CT CERVICAL SPINE FINDINGS

Alignment: 3 mm retrolisthesis of C4 on C5 appears unchanged from
prior CT scout images. No acute subluxation identified.

Skull base and vertebrae: No acute fracture. No primary bone lesion
or focal pathologic process.

Soft tissues and spinal canal: No prevertebral fluid or swelling. No
visible canal hematoma.

Disc levels: Multilevel degenerative disc disease, spondylosis and
facet arthropathy again noted contributing to central spinal and
foraminal narrowing. There is moderate central spinal narrowing at
C4-5.

Upper chest: No acute abnormality.

Other: None
IMPRESSION: 1. No evidence of acute intracranial abnormality. Atrophy and
chronic small-vessel white matter ischemic changes.
2. No static evidence of acute injury to the cervical spine.
Degenerative changes as described.
